# Patient Record
Sex: Female | Born: 1955 | Race: White | Hispanic: Refuse to answer
Health system: Western US, Academic
[De-identification: ages and names within clinical notes are randomized; demographics above are authoritative.]

## PROBLEM LIST (undated history)

## (undated) DIAGNOSIS — D649 Anemia, unspecified: Secondary | ICD-10-CM

## (undated) DIAGNOSIS — H269 Unspecified cataract: Secondary | ICD-10-CM

## (undated) DIAGNOSIS — I10 Essential (primary) hypertension: Secondary | ICD-10-CM

## (undated) DIAGNOSIS — Z8711 Personal history of peptic ulcer disease: Secondary | ICD-10-CM

## (undated) DIAGNOSIS — M1990 Unspecified osteoarthritis, unspecified site: Secondary | ICD-10-CM

## (undated) DIAGNOSIS — F410 Panic disorder [episodic paroxysmal anxiety] without agoraphobia: Secondary | ICD-10-CM

## (undated) DIAGNOSIS — Z9889 Other specified postprocedural states: Secondary | ICD-10-CM

## (undated) DIAGNOSIS — F32A Depression, unspecified: Secondary | ICD-10-CM

## (undated) DIAGNOSIS — T7840XA Allergy, unspecified, initial encounter: Secondary | ICD-10-CM

## (undated) DIAGNOSIS — F419 Anxiety disorder, unspecified: Secondary | ICD-10-CM

## (undated) DIAGNOSIS — R112 Nausea with vomiting, unspecified: Secondary | ICD-10-CM

## (undated) DIAGNOSIS — N189 Chronic kidney disease, unspecified: Secondary | ICD-10-CM

## (undated) DIAGNOSIS — M797 Fibromyalgia: Secondary | ICD-10-CM

## (undated) DIAGNOSIS — G43909 Migraine, unspecified, not intractable, without status migrainosus: Secondary | ICD-10-CM

## (undated) DIAGNOSIS — F4310 Post-traumatic stress disorder, unspecified: Secondary | ICD-10-CM

## (undated) DIAGNOSIS — E039 Hypothyroidism, unspecified: Secondary | ICD-10-CM

## (undated) DIAGNOSIS — I209 Angina pectoris, unspecified: Secondary | ICD-10-CM

## (undated) DIAGNOSIS — K759 Inflammatory liver disease, unspecified: Secondary | ICD-10-CM

## (undated) DIAGNOSIS — J45909 Unspecified asthma, uncomplicated: Secondary | ICD-10-CM

## (undated) DIAGNOSIS — Z8719 Personal history of other diseases of the digestive system: Secondary | ICD-10-CM

## (undated) DIAGNOSIS — R0602 Shortness of breath: Secondary | ICD-10-CM

## (undated) HISTORY — DX: Unspecified asthma, uncomplicated: J45.909

## (undated) HISTORY — DX: Depression, unspecified: F32.A

## (undated) HISTORY — PX: CHOLECYSTECTOMY: SHX55

## (undated) HISTORY — PX: EYE SURGERY: SHX253

## (undated) HISTORY — DX: Allergy, unspecified, initial encounter: T78.40XA

## (undated) HISTORY — DX: Unspecified cataract: H26.9

---
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
## 1976-03-05 HISTORY — PX: THYROIDECTOMY: SHX17

## 1987-03-06 HISTORY — PX: VAGINAL HYSTERECTOMY: SUR661

## 1993-03-05 DIAGNOSIS — F4310 Post-traumatic stress disorder, unspecified: Secondary | ICD-10-CM

## 1993-03-05 HISTORY — DX: Post-traumatic stress disorder, unspecified: F43.10

## 2000-06-28 ENCOUNTER — Encounter: Admission: RE | Admit: 2000-06-28 | Discharge: 2000-06-28 | Payer: Self-pay | Admitting: Family Medicine

## 2000-06-28 ENCOUNTER — Encounter: Payer: Self-pay | Admitting: Family Medicine

## 2000-08-12 ENCOUNTER — Encounter: Admission: RE | Admit: 2000-08-12 | Discharge: 2000-08-12 | Payer: Self-pay | Admitting: Family Medicine

## 2000-08-12 ENCOUNTER — Encounter: Payer: Self-pay | Admitting: Family Medicine

## 2001-08-20 ENCOUNTER — Encounter: Payer: Self-pay | Admitting: Family Medicine

## 2001-08-20 ENCOUNTER — Encounter: Admission: RE | Admit: 2001-08-20 | Discharge: 2001-08-20 | Payer: Self-pay | Admitting: Family Medicine

## 2002-02-09 ENCOUNTER — Encounter: Payer: Self-pay | Admitting: Family Medicine

## 2002-02-09 ENCOUNTER — Ambulatory Visit (HOSPITAL_COMMUNITY): Admission: RE | Admit: 2002-02-09 | Discharge: 2002-02-09 | Payer: Self-pay | Admitting: Family Medicine

## 2002-02-17 ENCOUNTER — Encounter: Payer: Self-pay | Admitting: Gastroenterology

## 2002-02-17 ENCOUNTER — Ambulatory Visit (HOSPITAL_COMMUNITY): Admission: RE | Admit: 2002-02-17 | Discharge: 2002-02-17 | Payer: Self-pay | Admitting: Gastroenterology

## 2005-06-21 ENCOUNTER — Encounter: Admission: RE | Admit: 2005-06-21 | Discharge: 2005-06-21 | Payer: Self-pay | Admitting: Family Medicine

## 2006-08-14 ENCOUNTER — Encounter: Admission: RE | Admit: 2006-08-14 | Discharge: 2006-08-14 | Payer: Self-pay | Admitting: Family Medicine

## 2006-11-08 ENCOUNTER — Ambulatory Visit (HOSPITAL_COMMUNITY): Admission: RE | Admit: 2006-11-08 | Discharge: 2006-11-08 | Payer: Self-pay | Admitting: Gastroenterology

## 2006-11-08 ENCOUNTER — Encounter (INDEPENDENT_AMBULATORY_CARE_PROVIDER_SITE_OTHER): Payer: Self-pay | Admitting: Gastroenterology

## 2007-09-17 ENCOUNTER — Encounter: Admission: RE | Admit: 2007-09-17 | Discharge: 2007-09-17 | Payer: Self-pay | Admitting: Family Medicine

## 2008-09-29 ENCOUNTER — Encounter: Admission: RE | Admit: 2008-09-29 | Discharge: 2008-09-29 | Payer: Self-pay | Admitting: Family Medicine

## 2009-06-15 ENCOUNTER — Encounter: Admission: RE | Admit: 2009-06-15 | Discharge: 2009-06-15 | Payer: Self-pay | Admitting: Family Medicine

## 2009-10-03 HISTORY — PX: KNEE CARTILAGE SURGERY: SHX688

## 2010-03-05 DIAGNOSIS — I10 Essential (primary) hypertension: Secondary | ICD-10-CM

## 2010-03-05 HISTORY — DX: Essential (primary) hypertension: I10

## 2010-07-18 NOTE — Op Note (Signed)
[name], [name]               ACCOUNT NO.:  [id number]   MEDICAL RECORD NO.:  [id number]          PATIENT TYPE:  AMB   LOCATION:  ENDO                         FACILITY:  [facility]   PHYSICIAN:  Anselmo Rod, M.D.  DATE OF BIRTH:  Jul 17, 1955   DATE OF PROCEDURE:  11/08/2006  DATE OF DISCHARGE:                               OPERATIVE REPORT   PROCEDURE PERFORMED:  Colonoscopy with cold biopsies x 1.   ENDOSCOPIST:  Anselmo Rod, MD.   INSTRUMENT USED:  Pentax video colonoscope.   INDICATIONS FOR PROCEDURE:  A 55 year old white female with a family  history of colon cancer in a maternal uncle and paternal grandfather  undergoing a screening colonoscopy. The patient has a history of  constipation with occasional rectal bleeding. Rule out colonic polyps,  masses, etc.   PREPROCEDURE PREPARATION:  Informed consent was procured from the  patient.  The patient fasted for 8 hours prior to the procedure. After  being prepped with a bottle of magnesium citrate and a gallon of  NuLYTELY the night prior to the procedure, the risks and benefits of the  procedure including a 10% miss rate of cancer and polyps were discussed  with the patient as well.   PREPROCEDURE PHYSICAL:  The patient had stable vital signs. Neck supple.  Chest clear to auscultation. S1, S2 regular. Abdomen soft with normal  bowel sounds.   DESCRIPTION OF PROCEDURE:  The patient was placed in the left lateral  knee position and sedated with 100 mcg of Fentanyl and 10 mg of Versed  given intravenously in slow incremental doses. Once the patient was  adequately sedated and maintained on low-flow oxygen and continuous  cardiac monitoring, the Pentax video colonoscope was advanced from the  rectum to the cecum, multiple washes were done. A small sessile polyp  was biopsied from the rectum.  The rest of the colonic mucosa appeared  healthy.  There was no evidence of diverticulosis.  The appendiceal  orifice and  ileocecal valve were clearly visualized and photographed.  The terminal ileum appeared healthy and without lesions. Retroflexion in  the rectum revealed small internal hemorrhoid.  The patient tolerated  the procedure well without complications.   IMPRESSION:  1. Small internal hemorrhoids.  2. Small sessile polyp biopsied from the rectum.  3. Otherwise normal-appearing colon up to the terminal ileum.   RECOMMENDATIONS:  1. Await pathology results.  2. Repeat colonoscopy depending on pathology results.  3. Avoid all nonsteroidals including aspirin for the next 2 weeks.  4. Outpatient follow-up as need arises in the future. A high fiber      diet with liberal fluid intake has been emphasized.      Anselmo Rod, M.D.  Electronically Signed     JNM/MEDQ  D:  11/08/2006  T:  11/08/2006  Job:  [id number]   cc:   Ace Gins, MD

## 2010-10-18 ENCOUNTER — Other Ambulatory Visit: Payer: Self-pay | Admitting: Family Medicine

## 2010-10-18 DIAGNOSIS — R51 Headache: Secondary | ICD-10-CM

## 2010-10-19 ENCOUNTER — Ambulatory Visit
Admission: RE | Admit: 2010-10-19 | Discharge: 2010-10-19 | Disposition: A | Discharge status: Home / Self Care | Payer: BC Managed Care – PPO | Source: Ambulatory Visit | Attending: Family Medicine | Admitting: Family Medicine

## 2010-10-19 DIAGNOSIS — R51 Headache: Secondary | ICD-10-CM

## 2010-10-20 ENCOUNTER — Other Ambulatory Visit: Payer: Self-pay | Admitting: Family Medicine

## 2010-10-20 DIAGNOSIS — G8929 Other chronic pain: Secondary | ICD-10-CM

## 2010-10-27 ENCOUNTER — Ambulatory Visit
Admission: RE | Admit: 2010-10-27 | Discharge: 2010-10-27 | Disposition: A | Discharge status: Home / Self Care | Payer: BC Managed Care – PPO | Source: Ambulatory Visit | Attending: Family Medicine | Admitting: Family Medicine

## 2010-10-27 DIAGNOSIS — G8929 Other chronic pain: Secondary | ICD-10-CM

## 2010-10-27 DIAGNOSIS — R102 Pelvic and perineal pain: Secondary | ICD-10-CM

## 2011-04-02 ENCOUNTER — Other Ambulatory Visit: Payer: Self-pay | Admitting: Family Medicine

## 2011-04-02 DIAGNOSIS — Z1231 Encounter for screening mammogram for malignant neoplasm of breast: Secondary | ICD-10-CM

## 2011-04-09 ENCOUNTER — Ambulatory Visit
Admission: RE | Admit: 2011-04-09 | Discharge: 2011-04-09 | Disposition: A | Discharge status: Home / Self Care | Payer: BC Managed Care – PPO | Source: Ambulatory Visit | Attending: Family Medicine | Admitting: Family Medicine

## 2011-04-09 DIAGNOSIS — Z1231 Encounter for screening mammogram for malignant neoplasm of breast: Secondary | ICD-10-CM

## 2011-04-17 ENCOUNTER — Ambulatory Visit: Payer: BC Managed Care – PPO

## 2011-08-23 ENCOUNTER — Other Ambulatory Visit: Payer: Self-pay | Admitting: Orthopedic Surgery

## 2011-08-23 DIAGNOSIS — M25511 Pain in right shoulder: Secondary | ICD-10-CM

## 2011-09-03 ENCOUNTER — Ambulatory Visit
Admission: RE | Admit: 2011-09-03 | Discharge: 2011-09-03 | Disposition: A | Discharge status: Home / Self Care | Payer: BC Managed Care – PPO | Source: Ambulatory Visit | Attending: Orthopedic Surgery | Admitting: Orthopedic Surgery

## 2011-09-03 DIAGNOSIS — M25511 Pain in right shoulder: Secondary | ICD-10-CM

## 2011-11-02 ENCOUNTER — Observation Stay (HOSPITAL_COMMUNITY)
Admission: EM | Admit: 2011-11-02 | Discharge: 2011-11-04 | Disposition: A | Discharge status: Home / Self Care | Payer: BC Managed Care – PPO | Attending: Internal Medicine | Admitting: Internal Medicine

## 2011-11-02 ENCOUNTER — Observation Stay (HOSPITAL_COMMUNITY): Payer: BC Managed Care – PPO

## 2011-11-02 ENCOUNTER — Encounter (HOSPITAL_COMMUNITY): Payer: Self-pay

## 2011-11-02 ENCOUNTER — Emergency Department (HOSPITAL_COMMUNITY): Payer: BC Managed Care – PPO

## 2011-11-02 DIAGNOSIS — E079 Disorder of thyroid, unspecified: Secondary | ICD-10-CM | POA: Diagnosis present

## 2011-11-02 DIAGNOSIS — R002 Palpitations: Secondary | ICD-10-CM

## 2011-11-02 DIAGNOSIS — G43909 Migraine, unspecified, not intractable, without status migrainosus: Secondary | ICD-10-CM

## 2011-11-02 DIAGNOSIS — R0602 Shortness of breath: Secondary | ICD-10-CM

## 2011-11-02 DIAGNOSIS — F410 Panic disorder [episodic paroxysmal anxiety] without agoraphobia: Secondary | ICD-10-CM

## 2011-11-02 DIAGNOSIS — I10 Essential (primary) hypertension: Secondary | ICD-10-CM | POA: Diagnosis present

## 2011-11-02 DIAGNOSIS — E878 Other disorders of electrolyte and fluid balance, not elsewhere classified: Secondary | ICD-10-CM

## 2011-11-02 DIAGNOSIS — R079 Chest pain, unspecified: Principal | ICD-10-CM

## 2011-11-02 DIAGNOSIS — IMO0001 Reserved for inherently not codable concepts without codable children: Secondary | ICD-10-CM | POA: Insufficient documentation

## 2011-11-02 DIAGNOSIS — I209 Angina pectoris, unspecified: Secondary | ICD-10-CM

## 2011-11-02 DIAGNOSIS — E860 Dehydration: Secondary | ICD-10-CM

## 2011-11-02 DIAGNOSIS — F419 Anxiety disorder, unspecified: Secondary | ICD-10-CM

## 2011-11-02 DIAGNOSIS — R9431 Abnormal electrocardiogram [ECG] [EKG]: Secondary | ICD-10-CM

## 2011-11-02 DIAGNOSIS — D649 Anemia, unspecified: Secondary | ICD-10-CM

## 2011-11-02 DIAGNOSIS — E876 Hypokalemia: Secondary | ICD-10-CM

## 2011-11-02 DIAGNOSIS — E870 Hyperosmolality and hypernatremia: Secondary | ICD-10-CM

## 2011-11-02 DIAGNOSIS — E890 Postprocedural hypothyroidism: Secondary | ICD-10-CM | POA: Insufficient documentation

## 2011-11-02 DIAGNOSIS — M797 Fibromyalgia: Secondary | ICD-10-CM | POA: Diagnosis present

## 2011-11-02 HISTORY — DX: Anxiety disorder, unspecified: F41.9

## 2011-11-02 HISTORY — DX: Anemia, unspecified: D64.9

## 2011-11-02 HISTORY — DX: Inflammatory liver disease, unspecified: K75.9

## 2011-11-02 HISTORY — DX: Angina pectoris, unspecified: I20.9

## 2011-11-02 HISTORY — DX: Personal history of peptic ulcer disease: Z87.11

## 2011-11-02 HISTORY — DX: Migraine, unspecified, not intractable, without status migrainosus: G43.909

## 2011-11-02 HISTORY — DX: Post-traumatic stress disorder, unspecified: F43.10

## 2011-11-02 HISTORY — DX: Essential (primary) hypertension: I10

## 2011-11-02 HISTORY — DX: Panic disorder (episodic paroxysmal anxiety): F41.0

## 2011-11-02 HISTORY — DX: Personal history of other diseases of the digestive system: Z87.19

## 2011-11-02 HISTORY — DX: Fibromyalgia: M79.7

## 2011-11-02 HISTORY — DX: Shortness of breath: R06.02

## 2011-11-02 HISTORY — DX: Hypothyroidism, unspecified: E03.9

## 2011-11-02 LAB — COMPREHENSIVE METABOLIC PANEL
Alkaline Phosphatase: 71 U/L (ref 39–117)
BUN: 17 mg/dL (ref 6–23)
Calcium: 9.6 mg/dL (ref 8.4–10.5)
GFR calc Af Amer: 66 mL/min — ABNORMAL LOW (ref >90)
GFR calc non Af Amer: 57 mL/min — ABNORMAL LOW (ref >90)
Sodium: 139 mEq/L (ref 135–145)

## 2011-11-02 LAB — URINALYSIS, ROUTINE W REFLEX MICROSCOPIC
Bilirubin Urine: NEGATIVE
Glucose, UA: NEGATIVE mg/dL
Hgb urine dipstick: NEGATIVE
Protein, ur: NEGATIVE mg/dL
Urobilinogen, UA: 0.2 mg/dL (ref 0.0–1.0)

## 2011-11-02 LAB — CBC
HCT: 37.7 % (ref 36.0–46.0)
MCH: 29.2 pg (ref 26.0–34.0)
MCV: 83.4 fL (ref 78.0–100.0)
Platelets: 268 10*3/uL (ref 150–400)
RDW: 13.1 % (ref 11.5–15.5)

## 2011-11-02 LAB — APTT: aPTT: 27 seconds (ref 24–37)

## 2011-11-02 MED ORDER — SODIUM CHLORIDE 0.9 % IV SOLN
INTRAVENOUS | Status: DC
  Administered 2011-11-02: 22:00:00 via INTRAVENOUS

## 2011-11-02 MED ORDER — ASPIRIN 81 MG PO CHEW
324.0000 mg | CHEWABLE_TABLET | Freq: Once | ORAL | Status: DC
  Filled 2011-11-02: qty 1

## 2011-11-02 MED ORDER — REGADENOSON 0.4 MG/5ML IV SOLN
0.4000 mg | Freq: Once | INTRAVENOUS | Status: DC
  Filled 2011-11-02: qty 5

## 2011-11-02 MED ORDER — MORPHINE SULFATE 2 MG/ML IJ SOLN: 2.0000 mg | Freq: Once | INTRAMUSCULAR | Status: DC

## 2011-11-02 MED ORDER — ONDANSETRON HCL 4 MG/2ML IJ SOLN: 4.0000 mg | Freq: Four times a day (QID) | INTRAMUSCULAR | Status: DC | PRN

## 2011-11-02 MED ORDER — NITROGLYCERIN 0.4 MG SL SUBL: 0.4000 mg | SUBLINGUAL_TABLET | SUBLINGUAL | Status: DC | PRN

## 2011-11-02 MED ORDER — METOPROLOL TARTRATE 25 MG PO TABS: 25.0000 mg | ORAL_TABLET | Freq: Two times a day (BID) | ORAL | Status: DC

## 2011-11-02 MED ORDER — POTASSIUM CHLORIDE 20 MEQ/15ML (10%) PO LIQD
40.0000 meq | Freq: Once | ORAL | Status: AC
  Administered 2011-11-02: 40 meq via ORAL
  Filled 2011-11-02: qty 30

## 2011-11-02 MED ORDER — SODIUM CHLORIDE 0.9 % IV BOLUS (SEPSIS)
1000.0000 mL | Freq: Once | INTRAVENOUS | Status: AC
  Administered 2011-11-02: 1000 mL via INTRAVENOUS

## 2011-11-02 MED ORDER — ZOLPIDEM TARTRATE 5 MG PO TABS: 5.0000 mg | ORAL_TABLET | Freq: Every evening | ORAL | Status: DC | PRN

## 2011-11-02 MED ORDER — ASPIRIN EC 81 MG PO TBEC
81.0000 mg | DELAYED_RELEASE_TABLET | Freq: Every day | ORAL | Status: DC
  Administered 2011-11-03 – 2011-11-04 (×2): 81 mg via ORAL
  Filled 2011-11-02 (×2): qty 1

## 2011-11-02 MED ORDER — ACETAMINOPHEN 325 MG PO TABS
650.0000 mg | ORAL_TABLET | ORAL | Status: DC | PRN
  Administered 2011-11-03 (×2): 650 mg via ORAL
  Filled 2011-11-02 (×2): qty 2

## 2011-11-02 MED ORDER — METOPROLOL TARTRATE 25 MG PO TABS
25.0000 mg | ORAL_TABLET | Freq: Two times a day (BID) | ORAL | Status: DC
  Administered 2011-11-03 – 2011-11-04 (×2): 25 mg via ORAL
  Filled 2011-11-02 (×4): qty 1

## 2011-11-02 MED ORDER — ATORVASTATIN CALCIUM 20 MG PO TABS
20.0000 mg | ORAL_TABLET | Freq: Every day | ORAL | Status: DC
  Administered 2011-11-03: 20 mg via ORAL
  Filled 2011-11-02 (×2): qty 1

## 2011-11-02 MED ORDER — PANTOPRAZOLE SODIUM 40 MG IV SOLR
40.0000 mg | Freq: Two times a day (BID) | INTRAVENOUS | Status: DC
  Administered 2011-11-02 – 2011-11-03 (×2): 40 mg via INTRAVENOUS
  Filled 2011-11-02 (×4): qty 40

## 2011-11-02 MED ORDER — METOPROLOL TARTRATE 25 MG PO TABS
25.0000 mg | ORAL_TABLET | Freq: Once | ORAL | Status: AC
  Administered 2011-11-02: 25 mg via ORAL
  Filled 2011-11-02: qty 1

## 2011-11-02 NOTE — ED Notes (Signed)
Cardiology remains at bedside. Pt ready for transport to admit room.

## 2011-11-02 NOTE — ED Notes (Signed)
Pt states feeling "funny" and c/o burning in chest. Hr 155. EKG complete given to [facility] and Dr. Radford Pax informed. Pt denies SOB. Pt color pink skin w/d resp easy non labored.

## 2011-11-02 NOTE — ED Notes (Signed)
Pt refused CT chest. Pt to floor via monitor husband informed of transport 3W informed

## 2011-11-02 NOTE — ED Provider Notes (Signed)
History     CSN: [id number]  Arrival date & time 11/02/11  1446   First MD Initiated Contact with Patient 11/02/11 1501      Chief Complaint  Patient presents with  . Chest Pain    (Consider location/radiation/quality/duration/timing/severity/associated sxs/prior treatment) HPI  56 y.o. Julie [name] iNAD c/o palpitations (feeling of heart racing and irregular) with acute onset at 1300 today lasting for 2 hours. Denies chest pain/pressure, diaphoresis, nausea or vomiting. Patient has had similar episodes in the past; usually brought on by stress.  RF: HTN, father had heart attack at 8 (still living) No cardiologist   Past Medical History  Diagnosis Date  . Hypertension   . Fibromyalgia   . Thyroid disease     Past Surgical History  Procedure Date  . Abdominal hysterectomy   . Thyroidectomy   . Joint replacement     No family history on file.  History  Substance Use Topics  . Smoking status: Not on file  . Smokeless tobacco: Not on file  . Alcohol Use:     OB History    Grav Para Term Preterm Abortions TAB SAB Ect Mult Living                  Review of Systems  Constitutional: Negative for fever.  Cardiovascular: Positive for palpitations. Negative for chest pain.  All other systems reviewed and are negative.    Allergies  Penicillins  Home Medications   Current Outpatient Rx  Name Route Sig Dispense Refill  . LEVOTHYROXINE SODIUM 100 MCG PO TABS Oral Take 100 mcg by mouth daily.    . MELOXICAM 15 MG PO TABS Oral Take 7.5 mg by mouth daily as needed. For shoulder pain    . OLMESARTAN MEDOXOMIL-HCTZ 40-25 MG PO TABS Oral Take 0.5 tablets by mouth daily.      BP 163/76  Pulse 115  Temp 98 F (36.7 C) (Oral)  Resp 18  SpO2 99%  Physical Exam  Nursing note and vitals reviewed. Constitutional: She is oriented to person, place, and time. She appears well-developed and well-nourished. No distress.  HENT:  Head: Normocephalic and atraumatic.  Eyes:  Conjunctivae and EOM are normal. Pupils are equal, round, and reactive to light.  Neck: Normal range of motion.  Cardiovascular: Intact distal pulses.        Tachycardic no murmurs rubs or gallops appreciated  Pulmonary/Chest: Effort normal and breath sounds normal. No respiratory distress. She has no wheezes. She has no rales. She exhibits no tenderness.  Musculoskeletal: Normal range of motion.  Neurological: She is alert and oriented to person, place, and time.  Skin: Skin is warm.  Psychiatric: She has a normal mood and affect.    ED Course  Procedures (including critical care time)  Labs Reviewed  COMPREHENSIVE METABOLIC PANEL - Abnormal; Notable for the following:    Potassium 3.2 (*)     Glucose, Bld 147 (*)     Total Bilirubin 0.2 (*)     GFR calc non Af Amer 57 (*)     GFR calc Af Amer 66 (*)     All other components within normal limits  APTT  PROTIME-INR  MAGNESIUM  URINALYSIS, ROUTINE W REFLEX MICROSCOPIC  CBC  POCT I-STAT TROPONIN I   Dg Chest Portable 1 View  11/02/2011  *RADIOLOGY REPORT*  Clinical Data: Chest pain.  Tachycardia.  PORTABLE CHEST - 1 VIEW  Comparison: 06/15/2009  Findings: Cardiac and mediastinal contours appear normal.  The lungs  appear clear.  No pleural effusion is identified.  IMPRESSION:  No significant abnormality identified.   Original Report Authenticated By: Dellia Cloud, M.D.     Date: 11/02/2011  Rate: 109  Rhythm: sinus tachycardia  QRS Axis: normal  Intervals: normal  ST/T Wave abnormalities: nonspecific ST changes  Conduction Disutrbances:nonspecific intraventricular conduction delay  Narrative Interpretation: ST depression in the inferior leads with no reciprocal changes  Old EKG Reviewed: none available   1. Palpitation   2. Dehydration   3. EKG abnormalities   4. Hypokalemia       MDM  56 year old Julie [name] complaining of palpitations for 2 hours now resolved. Patient's EKG shows sinus tachycardia with ST  depression in the inferior leads, no reciprocal changes and borderline IVCD. Troponin negative. No old EKGs available we also called [facility] and [facility] in [place]  order to find an old EKG, and none were available.  Found to be mildly hypokalemic at 3.2 I will supplement with 40 mEq by mouth.  Patient is found to be dehydrated with orthostatic vital signs. I will bolus 1 L normal saline.  Considering the ST depression in the patient's age she needs to be admitted for admitted for ACS rule out. Patient is not eligible for CDU low risk ACS rule out considering her EKG changes.  She will be admitted to telemetry under the care of Dr. Malachi Bonds [facility]        [facility], PA-C 11/02/11 1740

## 2011-11-02 NOTE — H&P (Signed)
[facility] History and Physical  Julie [name] ZOX:[id number] DOB: 1955/09/09 DOA: 11/02/2011  Referring physician: Dr. Lynetta Mare PCP: Sheila Oats, MD   Chief Complaint: Chest pain and palpitations  HPI:   56 yo female with history of hypothyroidism on synthroid, hypertension, fibromyalgia who was feeling completely normal this morning.  At 1pm, she felt her heart beating extremely fast.  She had palpitations without chest pain.  She did not feel Julie [name] of breath without nausea, vomiting, diaphoresis, presyncopal.  She went to PCP where she had an EKG and was given 4 baby aspirins.  She was transported via EMS to the [facility] ER.  She developed a burning sensation in the left chest 8/10 in the ambulance and in the ER at which time she started feeling anxious.  Chest pain improved in the ER and then suddenly she felt flushed and then the burning returned.  She denies shortness of breath, nausea, diaphoresis.  States pain in currently 3/10 with some radiation to the mid-back.  Patient's father had his first MI at 6 years old.  In ER, labs notable for hypokalemia, replaced.  Troponin #1 negative.  First EKG showed 0.5 to 1mm ST segment depressions in inferior leads and V5-V6.  Sinus tachycardia.  She was given bolus of NS.  When chest pain returned, EKG repeated and again sinus tachycardia with similar morphology, although slightly less ST segment depression in inferior leads and T-waves appear taller.  Review of Systems:   Left shoulder pain, recently received cortisone one week ago and started maloxicam last night, first dose.  Complete ROS otherwise negative.  Past Medical History  Diagnosis Date  . Hypertension   . Fibromyalgia   . Thyroid disease    Past Surgical History  Procedure Date  . Abdominal hysterectomy   . Thyroidectomy   . Meninscal repair    Social History:  reports that she has never smoked. She does not have any smokeless tobacco history on file. She  reports that she drinks alcohol. She reports that she does not use illicit drugs. Lives at home with husband and able to ambulate without assist device.  Completes ADLS on own.    Allergies  Allergen Reactions  . Penicillins Hives    Family History  Problem Relation Age of Onset  . Coronary artery disease Father     First at age 23  . Diabetes Father   . Asthma Sister   . Thyroid disease Daughter   . Thyroid disease Daughter   . Hypertension Maternal Grandfather   . Aneurysm Paternal Grandmother     d/o brain aneurysm in 30s    Prior to Admission medications   Medication Sig Start Date End Date Taking? Authorizing Provider  levothyroxine (SYNTHROID, LEVOTHROID) 100 MCG tablet Take 100 mcg by mouth daily.   Yes Historical Provider, MD  meloxicam (MOBIC) 15 MG tablet Take 7.5 mg by mouth daily as needed. For shoulder pain   Yes Historical Provider, MD  olmesartan-hydrochlorothiazide (BENICAR HCT) 40-25 MG per tablet Take 0.5 tablets by mouth daily.   Yes Historical Provider, MD   Physical Exam: Filed Vitals:   11/02/11 1706 11/02/11 1709 11/02/11 1710 11/02/11 1745  BP: 124/69 135/75 139/72 180/93  Pulse: 78 90 100 136  Temp:      TempSrc:      Resp:    18  SpO2:    99%     General:  Thin CF, no acute distress  Eyes: PERRL, anicteric, noninjected  ENT: nares and  OP without exudate  Neck: supple, no thyromegaly or nodules, JVP 3cm from clavicle and bounding  Cardiovascular: Tachycardic, regular rhythm, bounding nondisplaced PMI, 2/6 systolic murmur RUSB, and across precordium  Respiratory: CTAB  Abdomen:  NABS, soft, nondistended, nontender  Skin: no rash  Musculoskeletal: Normal tone and bulk  Psychiatric: A&Ox4  Neurologic: II-XII intact, 5/5 throughout, sensation intact  Labs on Admission:  Basic Metabolic Panel:  Lab 11/02/11 1610  NA 139  K 3.2*  CL 100  CO2 26  GLUCOSE 147*  BUN 17  CREATININE 1.06  CALCIUM 9.6  MG 2.0  PHOS --   Liver  Function Tests:  Lab 11/02/11 1500  AST 15  ALT 12  ALKPHOS 71  BILITOT 0.2*  PROT 7.1  ALBUMIN 4.0   No results found for this basename: LIPASE:5,AMYLASE:5 in the last 168 hours No results found for this basename: AMMONIA:5 in the last 168 hours CBC:  Lab 11/02/11 1500  WBC 8.6  NEUTROABS --  HGB 13.2  HCT 37.7  MCV 83.4  PLT 268   Cardiac Enzymes: No results found for this basename: CKTOTAL:5,CKMB:5,CKMBINDEX:5,TROPONINI:5 in the last 168 hours  BNP (last 3 results) No results found for this basename: PROBNP:3 in the last 8760 hours CBG: No results found for this basename: GLUCAP:5 in the last 168 hours  Radiological Exams on Admission: Dg Chest Portable 1 View  11/02/2011  *RADIOLOGY REPORT*  Clinical Data: Chest pain.  Tachycardia.  PORTABLE CHEST - 1 VIEW  Comparison: 06/15/2009  Findings: Cardiac and mediastinal contours appear normal.  The lungs appear clear.  No pleural effusion is identified.  IMPRESSION:  No significant abnormality identified.   Original Report Authenticated By: Dellia Cloud, M.D.     EKG:  See HPI portion  Assessment/Plan Principal Problem:  *Chest pain Active Problems:  Hypertension  Fibromyalgia  Thyroid disease  Palpitations  Chest pain:  DDX includes ACS (unstable angina), dissection, myocarditis, PE, severe GERD, anxiety, hyperthyroidism, or less likely, pheo or carcinoid given flushed sensation.  Most important to rule out common and severe problems. -  CTangio to rule out dissection -  Cardiology to evaluate patient given subtle EKG changes and family history -  Telemetry and cycle troponins -  ECHO -  Start statin and consider beta blocker (if no stress test in AM) -  Continue ASA -  Consider lovenox if CTa negative -  AM lipid panel and A1c -  Morphine once -  NTG prn -  Continue oxygen via nasal canula Also: -  Add on TSH -  Start IV protonix -  Consider serum metanephrines and/or carcinoid w/u if other  evaluation not revealing  Hyperthyroidism:  Possibly on excessive dose.   -  Hold synthroid pending TSH  Fibromyalgia:  Stable.  Hypertension:  Blood pressure currently labile, possibly secondary to pain -  Hold home BP meds as patient was dry at presentation -  Control pain -  Consider addition of labetalol or clonidine prn  DIET:  Healthy heart, then NPO at MN if stress in A  Code Status: Full  Family Communication: Airis Barbee.  [phone] Disposition Plan: Pending completion of work up to rule out lethal causes of chest pain.  Time spent: 79  SHORTThea [name] [facility] Pager [phone]  If 7PM-7AM, please contact night-coverage [email or web address] Password [email or web address] 11/02/2011, 6:06 PM

## 2011-11-02 NOTE — ED Notes (Signed)
Pt denies complaints at this time. Pt ambulates to bathroom

## 2011-11-02 NOTE — Consult Note (Signed)
CARDIOLOGY CONSULT NOTE  Patient ID: Julie [name] MRN: [id number], DOB/AGE: 58-Jul-1957   Admit date: 11/02/2011 Date of Consult: 11/02/2011   Primary Physician: [facility] Primary Cardiologist: new - would f/u in [place].  Pt. Profile  56 year old female without prior cardiac history who presented to ED with tachycardia and chest burning.  Problem List  Past Medical History  Diagnosis Date  . Hypertension   . Fibromyalgia   . Thyroid disease     Past Surgical History  Procedure Date  . Abdominal hysterectomy   . Thyroidectomy   . Meninscal repair     Allergies  Allergies  Allergen Reactions  . Penicillins Hives   HPI   57 year old female with the above problem list. She has no prior cardiac history although her father has had multiple myocardial infarctions the first occurring at age 51.  She reports that over the past few weeks she's been feeling more anxious than usual. She does have a history of hypothyroidism with frequent swings and her thyroid levels and subsequent dose changes. She says that usually when she is running hyperthyroid she feels this way. Today, she was walking at work and had sudden onset of tachy-palpitations with a sense of general malaise. She drove directly to her physician's office where an ECG was performed and she was told something did not look right. She was given 4 baby aspirin and sent via EMS to the ED. Here, ECG shows a sinus tachycardia with minimal inferolateral ST segment depression and flattening. Cardiac markers have been normal. She was initially symptom-free however while sitting in the ER, she had recurrent sinus tachycardia with rates into the 130s. This was again associated with chest burning. Rate and symptoms returned to normal without intervention after a few minutes. She's currently pain-free. We've asked to eval.  Inpatient Medications     . aspirin  324 mg Oral Once  .  morphine injection  2 mg Intravenous Once  . potassium  chloride  40 mEq Oral Once  . sodium chloride  1,000 mL Intravenous Once   Home Meds  Prior to Admission medications   Medication Sig Start Date End Date Taking? Authorizing Provider  levothyroxine (SYNTHROID, LEVOTHROID) 100 MCG tablet Take 100 mcg by mouth daily.   Yes Historical Provider, MD  meloxicam (MOBIC) 15 MG tablet Take 7.5 mg by mouth daily as needed. For shoulder pain   Yes Historical Provider, MD  olmesartan-hydrochlorothiazide (BENICAR HCT) 40-25 MG per tablet Take 0.5 tablets by mouth daily.   Yes Historical Provider, MD    Family History Family History  Problem Relation Age of Onset  . Coronary artery disease Father     First at age 58  . Diabetes Father   . Asthma Sister   . Thyroid disease Daughter   . Thyroid disease Daughter   . Hypertension Maternal Grandfather   . Aneurysm Paternal Grandmother     d/o brain aneurysm in 30s     Social History History   Social History  . Marital Status: Married    Spouse Name: N/A    Number of Children: N/A  . Years of Education: N/A   Occupational History  . Not on file.   Social History Main Topics  . Smoking status: Never Smoker   . Smokeless tobacco: Not on file  . Alcohol Use: 0.0 oz/week    0 Glasses of wine per week     occasional glass of wine.  . Drug Use: No  . Sexually Active:  Not on file   Other Topics Concern  . 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  She does not routinely exercise.  Lives in [place] with husband.    Review of Systems  General:  No chills, fever, night sweats or weight changes.  She has been feeling more anxious than usual.  Cardiovascular: Chest burning and palpitations as outlined above.  No dyspnea on exertion, edema, orthopnea, palpitations, paroxysmal nocturnal dyspnea. Dermatological: No rash, lesions/masses Respiratory: No cough, dyspnea Urologic: No hematuria, dysuria Abdominal:   No nausea, vomiting, diarrhea, bright red  blood per rectum, melena, or hematemesis Neurologic:  No visual changes, wkns, changes in mental status. All other systems reviewed and are otherwise negative except as noted above.  Physical Exam  Blood pressure 164/85, pulse 108, temperature 98 F (36.7 C), temperature source Oral, resp. rate 21, SpO2 100.00%.  General: Pleasant, NAD Psych: Normal affect. Neuro: Alert and oriented X 3. Moves all extremities spontaneously. HEENT: Normal  Neck: Supple without bruits or JVD. Lungs:  Resp regular and unlabored, CTA. Heart: RRR, tachy. no s3, s4, or murmurs. Abdomen: Soft, non-tender, non-distended, BS + x 4.  Extremities: No clubbing, cyanosis or edema. DP/PT/Radials 2+ and equal bilaterally.  Labs  Lab Results  Component Value Date   WBC 8.6 11/02/2011   HGB 13.2 11/02/2011   HCT 37.7 11/02/2011   MCV 83.4 11/02/2011   PLT 268 11/02/2011     Lab 11/02/11 1500  NA 139  K 3.2*  CL 100  CO2 26  BUN 17  CREATININE 1.06  CALCIUM 9.6  PROT 7.1  BILITOT 0.2*  ALKPHOS 71  ALT 12  AST 15  GLUCOSE 147*   Radiology/Studies  Dg Chest Portable 1 View  11/02/2011  *RADIOLOGY REPORT*  Clinical Data: Chest pain.  Tachycardia.  PORTABLE CHEST - 1 VIEW  Comparison: 06/15/2009  Findings: Cardiac and mediastinal contours appear normal.  The lungs appear clear.  No pleural effusion is identified.  IMPRESSION:  No significant abnormality identified.   Original Report Authenticated By: Dellia Cloud, M.D.    ECG  Sinus tach, 109, minimal st dep/flattening II, III, aVF, V5,V6.  ASSESSMENT AND PLAN   1. Sinus tachycardia: Patient's biggest complaint is palpitations with associated chest burning. She's had sinus tachycardia since presenting to the ED. She is scheduled for a CT angiography chest by the internal medicine team to rule out PE or dissection. She is currently hemodynamically stable and without complaints. Given her history of fluctuating TSH and Synthroid dosing, recommend  thyroid function testing. She says it has been a few months.  We will add low-dose beta blocker.  2.  Chest burning: This has occurred in the setting of palpitations. She does have some ST segment changes although minimal. Don't have an old ECG to compare. Given her family history coronary disease, we will arrange for a pharmacologic stress test in the a.m. to rule out ischemia.  3.  HTN:  Resume home meds.  Add bb.  Signed, Nicolasa Ducking, NP 11/02/2011, 7:24 PM  Attending Note:   The patient was seen and examined.  Agree with assessment and plan as noted above.  Takyra is a 56 yo with hx of hypothyroidism who presents with episodes of chest burning and tachycardia.   She had mild ST depression in the inferior and lateral leads on ECG.  Agree with physical exam as above. Will start her on Metoprolol 25 BID and  get a lexiscan myoview in the morning.  She has borderline elevated lipids.  I think we should be aggressive in her lipid lowering therpy given her family hx of early MI.  Vesta Mixer, Montez Hageman., MD, [facility] 11/02/2011, 7:49 PM

## 2011-11-02 NOTE — ED Notes (Signed)
Julie [name] paged to inform pt refused CT

## 2011-11-02 NOTE — ED Notes (Signed)
Here from pmd for evaluation of chest pressure, depression noted inferior only, htn.

## 2011-11-02 NOTE — ED Provider Notes (Signed)
Medical screening examination/treatment/procedure(s) were performed by non-physician practitioner and as supervising physician I was immediately available for consultation/collaboration.    Nelia Shi, MD 11/02/11 [phone]

## 2011-11-03 DIAGNOSIS — D649 Anemia, unspecified: Secondary | ICD-10-CM

## 2011-11-03 DIAGNOSIS — I369 Nonrheumatic tricuspid valve disorder, unspecified: Secondary | ICD-10-CM

## 2011-11-03 DIAGNOSIS — E878 Other disorders of electrolyte and fluid balance, not elsewhere classified: Secondary | ICD-10-CM

## 2011-11-03 DIAGNOSIS — E870 Hyperosmolality and hypernatremia: Secondary | ICD-10-CM

## 2011-11-03 LAB — BASIC METABOLIC PANEL
BUN: 14 mg/dL (ref 6–23)
Calcium: 8.9 mg/dL (ref 8.4–10.5)
Creatinine, Ser: 0.9 mg/dL (ref 0.50–1.10)
GFR calc Af Amer: 80 mL/min — ABNORMAL LOW (ref >90)
GFR calc non Af Amer: 69 mL/min — ABNORMAL LOW (ref >90)

## 2011-11-03 LAB — LIPID PANEL
HDL: 56 mg/dL (ref >39)
Total CHOL/HDL Ratio: 3.2 RATIO
VLDL: 11 mg/dL (ref 0–40)

## 2011-11-03 LAB — TSH: TSH: 1.209 u[IU]/mL (ref 0.350–4.500)

## 2011-11-03 LAB — D-DIMER, QUANTITATIVE: D-Dimer, Quant: 0.3 ug/mL-FEU (ref 0.00–0.48)

## 2011-11-03 LAB — CBC
HCT: 35.1 % — ABNORMAL LOW (ref 36.0–46.0)
MCH: 28.3 pg (ref 26.0–34.0)
MCHC: 33 g/dL (ref 30.0–36.0)
MCV: 85.6 fL (ref 78.0–100.0)
Platelets: 266 10*3/uL (ref 150–400)
RDW: 13.5 % (ref 11.5–15.5)

## 2011-11-03 LAB — TROPONIN I: Troponin I: <0.3 ng/mL (ref <0.30)

## 2011-11-03 MED ORDER — PANTOPRAZOLE SODIUM 40 MG PO TBEC
40.0000 mg | DELAYED_RELEASE_TABLET | Freq: Two times a day (BID) | ORAL | Status: DC
  Administered 2011-11-04: 40 mg via ORAL
  Filled 2011-11-03: qty 1

## 2011-11-03 MED ORDER — ALUM & MAG HYDROXIDE-SIMETH 200-200-20 MG/5ML PO SUSP
30.0000 mL | Freq: Once | ORAL | Status: AC
  Administered 2011-11-03: 30 mL via ORAL
  Filled 2011-11-03: qty 30

## 2011-11-03 MED ORDER — LEVOTHYROXINE SODIUM 100 MCG PO TABS
100.0000 ug | ORAL_TABLET | Freq: Every day | ORAL | Status: DC
  Administered 2011-11-03 – 2011-11-04 (×2): 100 ug via ORAL
  Filled 2011-11-03 (×3): qty 1

## 2011-11-03 NOTE — Progress Notes (Addendum)
[facility] PROGRESS NOTE  YAMINAH CLAYBORN ZOX:[id number] DOB: [birth year] DOA: 11/02/2011 PCP: Sheila Oats, MD  Assessment/Plan: Principal Problem:  *Chest pain Active Problems:  Hypertension  Fibromyalgia  Thyroid disease  Palpitations  Chest pain: DDX includes ACS (unstable angina), dissection, myocarditis, PE, severe GERD, anxiety, hyperthyroidism, or less likely, pheo or carcinoid given flushed sensation. Most important to rule out common and severe problems.  Patient remembered yesterday that she has contrast intolerance which causes palpitations so CTa deferred at this time.  D-dimer done as alternative and was below threshold.  Troponins negative.  No telemetry events (including tachycardia) overnight despite multiple episodes of pain.  Possibly tachycardia and hypertension both related to pain and not causative.  Dissection less likely as patient not persistently hypertensive.  Defer serum metanephrines and carcinoid work up at this time.   - f/u ECHO  -  stress test in AM - Continue statin and beta blocker - Continue ASA  - NTG prn   Also, TSH was within normal limits so okay to restart synthroid.  Appreciate colleague adding order this morning.   -  Will do PPI PO BID and add maalox as symptoms are somewhat better since yesterday after starting protonix and were worse overnight compared to this morning. -  Add on lipase  Per cardiology, given family history, should aim for lower LDL level than for similar patient with only hypertension risk factor.  LDL 110 and HDL 56 -  Continue statin.  Hypernatremia and hyperchloremia likely related to IVF versus lab error.  Anemia likely hemodilution related and very mild, particularly since WBC down also.  No evidence of bleeding or hemolysis on exam and labs respectively. -  Recheck in AM now that IVF d/ced  Hyperthyroidism: TSH at goal - Continue synthroid  Fibromyalgia: Stable.  Hypertension: Blood pressure labile  yesterday, but improved today, SBP 130s  - Continue to hold home BP meds as started beta blocker and BP at goal   DIET: Healthy heart, then NPO at MN for stress ACCESS:  PIV IVF:  D/C today PROPH:  SCDs and patient ambulating.   Code Status: Full Family Communication: Spoke with patient regarding plan Disposition Plan: Pending stress test tomorrow with outpatient follow up with cardiology for cardionet   Brief narrative:  56 yo female with history of hypothyroidism on synthroid, hypertension, fibromyalgia who was feeling completely normal this morning. At 1pm, she felt her heart beating extremely fast. She had palpitations without chest pain. She did not feel Cong Hightower of breath without nausea, vomiting, diaphoresis, presyncopal. She went to PCP where she had an EKG and was given 4 baby aspirins. She was transported via EMS to the [facility] ER. She developed a burning sensation in the left chest 8/10 in the ambulance and in the ER at which time she started feeling anxious. Chest pain improved in the ER and then suddenly she felt flushed and then the burning returned. She denies shortness of breath, nausea, diaphoresis. States pain in currently 3/10 with some radiation to the mid-back. Patient's father had his first MI at 2 years old.  Pain occurs at rest.    In ER, labs notable for hypokalemia, replaced. Troponin #1 negative. First EKG showed 0.5 to 1mm ST segment depressions in inferior leads and V5-V6. Sinus tachycardia. She was given bolus of NS. When chest pain returned, EKG repeated and again sinus tachycardia with similar morphology, although slightly less ST segment depression in inferior leads and T-waves appear taller.   Consultants:  Cardiology  Procedures:  ECHO 8/31  Stress test planned for 9/1  Antibiotics:  None  HPI/Subjective:  Patient states that pain improved after initial admission but recurred, although milder form, overnight several times and has come back  again this morning.  Denies shortness of breath, nausea, vomiting, diarrhea, constipation, diaphoresis.  Denies indigestion and heartburn.  Objective: Filed Vitals:   11/02/11 1830 11/02/11 2100 11/03/11 0500 11/03/11 0955  BP: 164/85 124/75 130/75 131/86  Pulse: 108 76 68 73  Temp:  98.6 F (37 C) 98.3 F (36.8 C)   TempSrc:      Resp: 21 20 18    Height:  5\' 4"  (1.626 m)    Weight:  71.396 kg (157 lb 6.4 oz)    SpO2: 100% 96% 94%     Intake/Output Summary (Last 24 hours) at 11/03/11 1223 Last data filed at 11/03/11 0900  Gross per 24 hour  Intake    240 ml  Output      0 ml  Net    240 ml   Filed Weights   11/02/11 2100  Weight: 71.396 kg (157 lb 6.4 oz)    Exam:  General: Thin CF, no acute distress  HEENT: PERRL, anicteric, noninjected, MMM Cardiovascular: Regular rate and rhythm, no murmurs rubs or gallops Respiratory: CTAB  Abdomen: NABS, soft, nondistended, nontender  Musculoskeletal: Normal tone and bulk  Psychiatric: A&Ox4    Data Reviewed: Basic Metabolic Panel:  Lab 11/03/11 9562 11/02/11 1500  NA 148* 139  K 4.2 3.2*  CL 115* 100  CO2 26 26  GLUCOSE 86 147*  BUN 14 17  CREATININE 0.90 1.06  CALCIUM 8.9 9.6  MG -- 2.0  PHOS -- --   Liver Function Tests:  Lab 11/02/11 1500  AST 15  ALT 12  ALKPHOS 71  BILITOT 0.2*  PROT 7.1  ALBUMIN 4.0   No results found for this basename: LIPASE:5,AMYLASE:5 in the last 168 hours No results found for this basename: AMMONIA:5 in the last 168 hours CBC:  Lab 11/03/11 0455 11/02/11 1500  WBC 5.9 8.6  NEUTROABS -- --  HGB 11.6* 13.2  HCT 35.1* 37.7  MCV 85.6 83.4  PLT 266 268   Cardiac Enzymes:  Lab 11/03/11 0455 11/02/11 2104  CKTOTAL -- --  CKMB -- --  CKMBINDEX -- --  TROPONINI <0.30 <0.30   BNP (last 3 results) No results found for this basename: PROBNP:3 in the last 8760 hours CBG: No results found for this basename: GLUCAP:5 in the last 168 hours  No results found for this or any  previous visit (from the past 240 hour(s)).   Studies: Dg Chest Portable 1 View  11/02/2011  *RADIOLOGY REPORT*  Clinical Data: Chest pain.  Tachycardia.  PORTABLE CHEST - 1 VIEW  Comparison: 06/15/2009  Findings: Cardiac and mediastinal contours appear normal.  The lungs appear clear.  No pleural effusion is identified.  IMPRESSION:  No significant abnormality identified.   Original Report Authenticated By: Dellia Cloud, M.D.     Scheduled Meds:   . aspirin  324 mg Oral Once  . aspirin EC  81 mg Oral Daily  . atorvastatin  20 mg Oral q1800  . levothyroxine  100 mcg Oral QAC breakfast  . metoprolol tartrate  25 mg Oral BID  . metoprolol tartrate  25 mg Oral Once  .  morphine injection  2 mg Intravenous Once  . pantoprazole (PROTONIX) IV  40 mg Intravenous Q12H  . potassium chloride  40  mEq Oral Once  . regadenoson  0.4 mg Intravenous Once  . sodium chloride  1,000 mL Intravenous Once  . DISCONTD: metoprolol tartrate  25 mg Oral BID  . DISCONTD: regadenoson  0.4 mg Intravenous Once   Continuous Infusions:   . DISCONTD: sodium chloride 150 mL/hr at 11/02/11 2154    Principal Problem:  *Chest pain Active Problems:  Hypertension  Fibromyalgia  Thyroid disease  Palpitations    Time spent: 30    Arik Husmann, [facility]  [facility] Pager [phone]. If 8PM-8AM, please contact night-coverage at [email or web address], password [facility] 11/03/2011, 12:23 PM  LOS: 1 day

## 2011-11-03 NOTE — Progress Notes (Signed)
Peyton Bottoms, MD, [facility] ABIM Board Certified in Adult Cardiovascular Medicine,Internal Medicine and Critical Care Medicine      Subjective:    Some burning during nighttime. No SOB. No recurrent tachycardia    Objective:   Weight Range:  Vital Signs:   Temp:  [98 F (36.7 C)-98.6 F (37 C)] 98.3 F (36.8 C) (08/31 0500) Pulse Rate:  [68-136] 73  (08/31 0955) Resp:  [16-21] 18  (08/31 0500) BP: (124-188)/(68-93) 131/86 mmHg (08/31 0955) SpO2:  [94 %-100 %] 94 % (08/31 0500) Weight:  [157 lb 6.4 oz (71.396 kg)] 157 lb 6.4 oz (71.396 kg) (08/30 2100) Last BM Date: 11/02/11  Weight change: Filed Weights   11/02/11 2100  Weight: 157 lb 6.4 oz (71.396 kg)    Intake/Output:   Intake/Output Summary (Last 24 hours) at 11/03/11 1007 Last data filed at 11/03/11 0900  Gross per 24 hour  Intake    240 ml  Output      0 ml  Net    240 ml     Physical Exam: gen- no distress Neck- nl carotid upstoke Lungs; clear BS B Cor: RRR no murmurs abd soft Ext no edema.     Telemetry: NSR Labs: Basic Metabolic Panel:  Lab 11/03/11 1610 11/02/11 1500  NA 148* 139  K 4.2 3.2*  CL 115* 100  CO2 26 26  GLUCOSE 86 147*  BUN 14 17  CREATININE 0.90 1.06  CALCIUM 8.9 9.6  MG -- 2.0  PHOS -- --    Liver Function Tests:  Lab 11/02/11 1500  AST 15  ALT 12  ALKPHOS 71  BILITOT 0.2*  PROT 7.1  ALBUMIN 4.0   No results found for this basename: LIPASE:5,AMYLASE:5 in the last 168 hours No results found for this basename: AMMONIA:3 in the last 168 hours  CBC:  Lab 11/03/11 0455 11/02/11 1500  WBC 5.9 8.6  NEUTROABS -- --  HGB 11.6* 13.2  HCT 35.1* 37.7  MCV 85.6 83.4  PLT 266 268    Cardiac Enzymes:  Lab 11/03/11 0455 11/02/11 2104  CKTOTAL -- --  CKMB -- --  CKMBINDEX -- --  TROPONINI <0.30 <0.30     BNP: BNP (last 3 results) No results found for this basename: PROBNP:3 in the last 8760 hours  ABG No results found for this basename: phart, pco2,  pco2art, po2, po2art, hco3, tco2, acidbasedef, o2sat     Other results: EKG: NSR no acute changes Imaging: Dg Chest Portable 1 View  11/02/2011  *RADIOLOGY REPORT*  Clinical Data: Chest pain.  Tachycardia.  PORTABLE CHEST - 1 VIEW  Comparison: 06/15/2009  Findings: Cardiac and mediastinal contours appear normal.  The lungs appear clear.  No pleural effusion is identified.  IMPRESSION:  No significant abnormality identified.   Original Report Authenticated By: Dellia Cloud, M.D.       Medications:     Scheduled Medications:    . aspirin  324 mg Oral Once  . aspirin EC  81 mg Oral Daily  . atorvastatin  20 mg Oral q1800  . metoprolol tartrate  25 mg Oral BID  . metoprolol tartrate  25 mg Oral Once  .  morphine injection  2 mg Intravenous Once  . pantoprazole (PROTONIX) IV  40 mg Intravenous Q12H  . potassium chloride  40 mEq Oral Once  . regadenoson  0.4 mg Intravenous Once  . sodium chloride  1,000 mL Intravenous Once  . DISCONTD: metoprolol tartrate  25 mg Oral  BID  . DISCONTD: regadenoson  0.4 mg Intravenous Once     Infusions:    . sodium chloride 150 mL/hr at 11/02/11 2154     PRN Medications:  acetaminophen, nitroGLYCERIN, ondansetron (ZOFRAN) IV, zolpidem, DISCONTD: nitroGLYCERIN   Assessment:   1. Palpitation - ST but r/o SVT  2. Dehydration   3. EKG abnormalities -nonsprecific cTnI neg.  4. Hypokalemia   5. Chest pain   6. Hypothyroidism- meds never ordered  Patient Active Problem List  Diagnosis  . Hypertension  . Fibromyalgia  . Thyroid disease  . Palpitations  . Chest pain      Plan/Discussion:    Low Wells criteria for PE- if D- dimer negative no further FHx not reliable as father smoked > 2 packs a day and morbidly obese and sister no CAD Check lipid panel AM ECHO pending today. Rescheduled Myoview for AM Continue BB - D/C tomorrow if cardiac testing negative and arrange OP cardionet.     Length of Stay: 1   Alvin Critchley  [facility] 11/03/2011, 10:07 AM

## 2011-11-03 NOTE — Progress Notes (Signed)
  Echocardiogram 2D Echocardiogram has been performed.  Georgian Co 11/03/2011, 3:45 PM

## 2011-11-03 NOTE — Progress Notes (Signed)
1500 - c/o headache tylenol given pt reports relief no further c/o

## 2011-11-04 ENCOUNTER — Observation Stay (HOSPITAL_COMMUNITY): Payer: BC Managed Care – PPO

## 2011-11-04 DIAGNOSIS — I10 Essential (primary) hypertension: Secondary | ICD-10-CM

## 2011-11-04 MED ORDER — TECHNETIUM TC 99M TETROFOSMIN IV KIT
10.0000 | PACK | Freq: Once | INTRAVENOUS | Status: AC | PRN
  Administered 2011-11-04: 10 via INTRAVENOUS

## 2011-11-04 MED ORDER — OLMESARTAN MEDOXOMIL-HCTZ 40-25 MG PO TABS: 1.0000 | ORAL_TABLET | Freq: Every day | ORAL | Status: DC

## 2011-11-04 MED ORDER — NITROGLYCERIN 0.4 MG SL SUBL: 0.4000 mg | SUBLINGUAL_TABLET | SUBLINGUAL | Status: DC | PRN

## 2011-11-04 MED ORDER — HYDROCHLOROTHIAZIDE 25 MG PO TABS
25.0000 mg | ORAL_TABLET | Freq: Every day | ORAL | Status: DC
  Filled 2011-11-04: qty 1

## 2011-11-04 MED ORDER — METOPROLOL SUCCINATE ER 25 MG PO TB24: 25.0000 mg | ORAL_TABLET | Freq: Every day | ORAL | Status: DC

## 2011-11-04 MED ORDER — METOPROLOL TARTRATE 1 MG/ML IV SOLN
INTRAVENOUS | Status: AC
  Administered 2011-11-04: 5 mg
  Filled 2011-11-04: qty 5

## 2011-11-04 MED ORDER — IRBESARTAN 300 MG PO TABS
300.0000 mg | ORAL_TABLET | Freq: Every day | ORAL | Status: DC
  Filled 2011-11-04: qty 1

## 2011-11-04 MED ORDER — ASPIRIN 81 MG PO TBEC: 81.0000 mg | DELAYED_RELEASE_TABLET | Freq: Every day | ORAL | Status: AC

## 2011-11-04 MED ORDER — ATORVASTATIN CALCIUM 20 MG PO TABS: 20.0000 mg | ORAL_TABLET | Freq: Every day | ORAL | Status: DC

## 2011-11-04 MED ORDER — TECHNETIUM TC 99M TETROFOSMIN IV KIT
30.0000 | PACK | Freq: Once | INTRAVENOUS | Status: AC | PRN
  Administered 2011-11-04: 30 via INTRAVENOUS

## 2011-11-04 NOTE — Discharge Summary (Signed)
Physician Discharge Summary  Julie [name] XBM:[id number] DOB: 09/16/1955 DOA: 11/02/2011  PCP: Sheila Oats, MD  Admit date: 11/02/2011 Discharge date: 11/04/2011  Recommendations for Outpatient Follow-up:  1. Please follow up with primary care for repeat BMP and CBC within 1 week 2. Please take prednisone, ranitidine, and benadryl prior to procedure on Friday as prescribed 3. Follow up with cardiology on Friday 9/6 for outpatient catheterization following abnormal stress test.   Discharge Diagnoses:  Principal Problem:  *Chest pain Active Problems:  Hypertension  Fibromyalgia  Thyroid disease  Palpitations  Anemia  Hypernatremia  Hyperchloremia   Discharge Condition: stable, improved  Diet recommendation: Healthy heart  Wt Readings from Last 3 Encounters:  11/04/11 71.2 kg (156 lb 15.5 oz)    History of present illness:   56 yo female with history of hypothyroidism on synthroid, hypertension, fibromyalgia who was feeling completely normal this morning. At 1pm, she felt her heart beating extremely fast. She had palpitations without chest pain. She did not feel Julie [name] of breath without nausea, vomiting, diaphoresis, presyncopal. She went to PCP where she had an EKG and was given 4 baby aspirins. She was transported via EMS to the [facility] ER. She developed a burning sensation in the left chest 8/10 in the ambulance and in the ER at which time she started feeling anxious. Chest pain improved in the ER and then suddenly she felt flushed and then the burning returned. She denies shortness of breath, nausea, diaphoresis. States pain in currently 3/10 with some radiation to the mid-back. Patient's father had his first MI at 62 years old. Pain occurs at rest.   In ER, labs notable for hypokalemia, replaced. Troponin #1 negative. First EKG showed 0.5 to 1mm ST segment depressions in inferior leads and V5-V6. Sinus tachycardia. She was given bolus of NS. When chest pain returned, EKG  repeated and again sinus tachycardia with similar morphology, although slightly less ST segment depression in inferior leads and T-waves appear taller.   Hospital Course:   Chest pain:  Patient was admitted with chest pain with sinus tachycardia and subtle EKG changes in setting of family history of early MI in patient's father.  Troponins were below treshold and telemetry showed normal sinus rhythm for almost the complete duration of patient's stay.  ECHO was normal, but stress test was abnormal (official report still pending.)  Patient was seen by cardiology and is recommended to have an outpatient catheterization on 9/6.  Other diagnoses were also considered:  D-dimer was below threshold, making dissection and PE less likely.  Liver function tests were normal (lipase only very minimally elevated at 61), but gallstone still possible.  She was given IV PPI and maalox which did not help symptoms.  TSH was wnl.   Although telemetry and blood pressure remained normal through most of her hospitalization, as the cardiologist was talking to the patient on the day of discharge, Ms Payano's HR increased to the 150s (sinus tach) and her BP escalated to almost 197/58.  Anxiety may be contributing to her symptoms, but also possible, though less likely, carcinoid and pheochromocytoma.  If further cardiac work up is unrevealing, consider further investigation into these possibilities.  The patient should start taking a baby aspirin, beta blocker, and statin in addition to her previous medications pending further investigation into etiology of pain.  She should double her home blood pressure medication.  She was given instructions on premedication for contrast administration during her catheterization on Friday.  Hypernatremia and hyperchloremia likely  related to IVF versus lab error. Anemia likely hemodilution related and very mild, particularly since WBC down also. No evidence of bleeding or hemolysis on exam and labs  respectively.  - Recheck as outpatient  Procedures:  ECHO 8/31  NM stress 9/1  Consultations:  Cardiology  Discharge Exam: Filed Vitals:   11/04/11 1003  BP: 161/83  Pulse:   Temp:   Resp:    Filed Vitals:   11/04/11 0954 11/04/11 0957 11/04/11 1001 11/04/11 1003  BP: 197/58 179/62 193/85 161/83  Pulse: 127 150 97   Temp:      TempSrc:      Resp:      Height:      Weight:      SpO2:       General: Thin CF, no acute distress  HEENT: PERRL, anicteric, noninjected, MMM  Cardiovascular: Regular rate and rhythm, no murmurs rubs or gallops  Respiratory: CTAB Abdomen: NABS, soft, nondistended, nontender  Musculoskeletal: Normal tone and bulk, no LEE Psychiatric: A&Ox4    Discharge Instructions   Medication List  As of 11/04/2011 12:15 PM   ASK your doctor about these medications         levothyroxine 100 MCG tablet   Commonly known as: SYNTHROID, LEVOTHROID   Take 100 mcg by mouth daily.      meloxicam 15 MG tablet   Commonly known as: MOBIC   Take 7.5 mg by mouth daily as needed. For shoulder pain      olmesartan-hydrochlorothiazide 40-25 MG per tablet   Commonly known as: BENICAR HCT   Take 0.5 tablets by mouth daily.           Follow-up Information    Follow up with Redmond Baseman, MD. Schedule an appointment as soon as possible for a visit in 1 week.   Contact information:   [address] [place] [address] [phone]           The results of significant diagnostics from this hospitalization (including imaging, microbiology, ancillary and laboratory) are listed below for reference.    Significant Diagnostic Studies: Dg Chest Portable 1 View  11/02/2011  *RADIOLOGY REPORT*  Clinical Data: Chest pain.  Tachycardia.  PORTABLE CHEST - 1 VIEW  Comparison: 06/15/2009  Findings: Cardiac and mediastinal contours appear normal.  The lungs appear clear.  No pleural effusion is identified.   IMPRESSION:  No significant abnormality identified.   Original Report Authenticated By: Dellia Cloud, M.D.     Microbiology: No results found for this or any previous visit (from the past 240 hour(s)).   Labs: Basic Metabolic Panel:  Lab 11/03/11 9147 11/02/11 1500  NA 148* 139  K 4.2 3.2*  CL 115* 100  CO2 26 26  GLUCOSE 86 147*  BUN 14 17  CREATININE 0.90 1.06  CALCIUM 8.9 9.6  MG -- 2.0  PHOS -- --   Liver Function Tests:  Lab 11/02/11 1500  AST 15  ALT 12  ALKPHOS 71  BILITOT 0.2*  PROT 7.1  ALBUMIN 4.0    Lab 11/03/11 1338  LIPASE 61*  AMYLASE --   No results found for this basename: AMMONIA:5 in the last 168 hours CBC:  Lab 11/03/11 0455 11/02/11 1500  WBC 5.9 8.6  NEUTROABS -- --  HGB 11.6* 13.2  HCT 35.1* 37.7  MCV 85.6 83.4  PLT 266 268   Cardiac Enzymes:  Lab 11/03/11 0455 11/02/11 2104  CKTOTAL -- --  CKMB -- --  CKMBINDEX -- --  TROPONINI <0.30 <0.30   BNP: BNP (last 3 results) No results found for this basename: PROBNP:3 in the last 8760 hours CBG: No results found for this basename: GLUCAP:5 in the last 168 hours  Time coordinating discharge: 45 minutes  Signed:  Alessia Gonsalez  [facility] 11/04/2011, 12:15 PM

## 2011-11-04 NOTE — Progress Notes (Signed)
 Guy De Gent, MD, FACC ABIM Board Certified in Adult Cardiovascular Medicine,Internal Medicine and Critical Care Medicine      Subjective:    Doing better no recurrent SCP. Stable. No SOB. Came to discuss results of ECHO and nuclear perfusion study.     Objective:   Weight Range:  Vital Signs:   Temp:  [97.6 F (36.4 C)-97.9 F (36.6 C)] 97.9 F (36.6 C) (09/01 0500) Pulse Rate:  [57-150] 97  (09/01 1001) Resp:  [18-20] 18  (09/01 0500) BP: (121-197)/(58-85) 161/83 mmHg (09/01 1003) SpO2:  [96 %-98 %] 96 % (09/01 0500) Weight:  [156 lb 15.5 oz (71.2 kg)] 156 lb 15.5 oz (71.2 kg) (09/01 0500) Last BM Date: 11/04/11  Weight change: Filed Weights   11/02/11 2100 11/04/11 0500  Weight: 157 lb 6.4 oz (71.396 kg) 156 lb 15.5 oz (71.2 kg)    Intake/Output:   Intake/Output Summary (Last 24 hours) at 11/04/11 1131 Last data filed at 11/04/11 1000  Gross per 24 hour  Intake    600 ml  Output   2050 ml  Net  -1450 ml     Physical Exam:  Not performed today   Telemetry: NSR  Labs: Basic Metabolic Panel:  Lab 11/03/11 0455 11/02/11 1500  NA 148* 139  K 4.2 3.2*  CL 115* 100  CO2 26 26  GLUCOSE 86 147*  BUN 14 17  CREATININE 0.90 1.06  CALCIUM 8.9 9.6  MG -- 2.0  PHOS -- --    Liver Function Tests:  Lab 11/02/11 1500  AST 15  ALT 12  ALKPHOS 71  BILITOT 0.2*  PROT 7.1  ALBUMIN 4.0    Lab 11/03/11 1338  LIPASE 61*  AMYLASE --   No results found for this basename: AMMONIA:3 in the last 168 hours  CBC:  Lab 11/03/11 0455 11/02/11 1500  WBC 5.9 8.6  NEUTROABS -- --  HGB 11.6* 13.2  HCT 35.1* 37.7  MCV 85.6 83.4  PLT 266 268    Cardiac Enzymes:  Lab 11/03/11 0455 11/02/11 2104  CKTOTAL -- --  CKMB -- --  CKMBINDEX -- --  TROPONINI <0.30 <0.30     ECHO:  - Left ventricle: The cavity size was normal. Wall thickness was normal. Systolic function was normal. The estimated ejection fraction was in the range of 60% to 65%.  Wall motion was normal; there were no regional wall motion abnormalities. Left ventricular diastolic function parameters were normal. - Atrial septum: No defect or patent foramen ovale was identified.   Other results: EKG: NSR- otherwise normal/no ischemia Imaging: Dg Chest Portable 1 View  11/02/2011  *RADIOLOGY REPORT*  Clinical Data: Chest pain.  Tachycardia.  PORTABLE CHEST - 1 VIEW  Comparison: 06/15/2009  Findings: Cardiac and mediastinal contours appear normal.  The lungs appear clear.  No pleural effusion is identified.  IMPRESSION:  No significant abnormality identified.   Original Report Authenticated By: WALTER D. LIEBKEMANN, M.D.       Medications:     Scheduled Medications:    . alum & mag hydroxide-simeth  30 mL Oral Once  . aspirin EC  81 mg Oral Daily  . atorvastatin  20 mg Oral q1800  . hydrochlorothiazide  25 mg Oral Daily  . irbesartan  300 mg Oral Daily  . levothyroxine  100 mcg Oral QAC breakfast  . metoprolol tartrate  25 mg Oral BID  . pantoprazole  40 mg Oral BID AC  . regadenoson  0.4 mg Intravenous Once  .   DISCONTD: aspirin  324 mg Oral Once  . DISCONTD:  morphine injection  2 mg Intravenous Once  . DISCONTD: pantoprazole (PROTONIX) IV  40 mg Intravenous Q12H     Infusions:     PRN Medications:  acetaminophen, nitroGLYCERIN, ondansetron (ZOFRAN) IV, technetium tetrofosmin, technetium tetrofosmin, zolpidem   Assessment:   1. Palpitation started on BB - may need OP Cardionet monitor to F/U  2. EKG abnormalities non specific, but anormal CV test marked SR depression inferolateral w/o SCP. Nuclear perfusion imaging small anteroapical defect partially reverible- could represent shifting breast tissue- normal EF  3 Hypokalemia   4. Chest pain - typical and atypical features  6. Anemia   7. Hypertension - not controlled in- hospital ARB on hold.    Patient Active Problem List  Diagnosis  . Hypertension  . Fibromyalgia  . Thyroid  disease- TSH normal 8/30  . Palpitations  . Chest pain  . Anemia  . Hypernatremia  . Hyperchloremia 8/30 LDL= 110 HDL =56 TG=57      Plan/Discussion:    Plan to discharge patient on mome meds for HTN, with addition low dose BB and ASA. PRN NTG for SCP/burning Suspect FALSE POSITIVE nuclear study, but given FHx will need definitve diagnosis- I tried to reassure the patient and truly suspect she has no significant disease and I am comfortable with OP JV cath. Patient can contact me at [phone] if questions or Dalton McLean. Will try to have Dr. McLean do cath on Friday if available. Patient has IVP dye allergy and she will be pretreated with PDN/benadryl/Zantac for 3 doses prior to catheterization .  I discussed the risks and benefits of a diagnostic cardiac catheterization with the patient.   We also discussed the radial versus femoral approach.  In particular I quoted that the risk of bleeding from the radial artery is usually less than 1%.  I also explained however that the procedure is more challenging for the cardiologist and does involve a slightly greater radiation exposure although scientist estimate that increased radiation is equivalent to 20 chest x-rays representing only a small risk of the patient.   The following general risks were quoted to the patient for a diagnostic cardiac catheterization:     Length of Stay: 2   Guy De Gent 11/04/2011, 11:31 AM  

## 2011-11-05 MED FILL — Regadenoson IV Inj 0.4 MG/5ML (0.08 MG/ML): INTRAVENOUS | Qty: 5 | Status: AC

## 2011-11-06 ENCOUNTER — Telehealth: Payer: Self-pay | Admitting: *Deleted

## 2011-11-06 NOTE — Telephone Encounter (Signed)
11/06/11-spoke with patient. Pt states that this morning she had another episode similar to symptoms prior to hospitalization. Pt states she woke this morning with  chest pain and heart racing. She states she took 1 NTG with relief. She is asking for further recommendations. I will review with Dr Shirlee Latch.      Message     OK with me.   ----- Message -----   From: Jacqlyn Krauss, RN   Sent: 11/06/2011 11:13 AM   To: Laurey Morale, MD   Subject: RE: Cardiac Cath in the [facility] Lab       Next week your schedule is 9/9 [facility] Ofc/Off, 9/10 [facility] DOD, 9/11 [facility] Ofc all day, 9/12 [facility] DOD, 9/13 [facility] Ofc/Reader. I am going to try to schedule her for Thurs 9/12 on your [place] DOD day right? Kimberlye Dilger   ----- Message -----   From: Laurey Morale, MD   Sent: 11/06/2011 11:05 AM   To: Jacqlyn Krauss, RN   Subject: RE: Cardiac Cath in the [facility]       I do. Schedule her for J[facility] lab radial as early next week as feasible for me. Any day I am in hospital. She will need premeds for contrast allergy it looks like.       ----- Message -----   From: Jacqlyn Krauss, RN   Sent: 11/06/2011 10:08 AM   To: Laurey Morale, MD   Subject: FW: Cardiac Cath in the [facility] Lab       Dr Shirlee Latch do you know anything about this? It looks like Dr Elease Hashimoto and and Dr Andee Lineman saw her in the hospital. You are off Friday 11/09/11 and I have already scheduled Spero Geralds for you to do at 7:30 that morning. DC says follow-up in [place]. Do you know if she needs cardiology follow-up and if she does which cardiologist she will see? Serapio Edelson    ----- Message -----   From: Paulene Floor   Sent: 11/06/2011 9:54 AM   To: Jacqlyn Krauss, RN   Subject: Cardiac Cath in the [facility] Lab       Per After Hour Voicemail, Patient would like to be scheduled to have her Cath on 11/09/11. Please call back.

## 2011-11-06 NOTE — Telephone Encounter (Signed)
Reviewed with Dr Shirlee Latch. He recommended scheduling [facility] for 11/12/11 in the afternoon. No further recommendations at present. Pt is aware.

## 2011-11-07 ENCOUNTER — Encounter: Payer: Self-pay | Admitting: *Deleted

## 2011-11-07 NOTE — Telephone Encounter (Signed)
LHC radial JV scheduled for 11/12/11 12:30pm.

## 2011-11-07 NOTE — Telephone Encounter (Signed)
Pt aware of cath date and time. Instructions mailed to pt. Dr Shirlee Latch instructed pt to take Prednisone 60mg  12 hours and 1-2 hours prior to cath for contrast allergy. Per Charmaine--no pre cert required.

## 2011-11-08 ENCOUNTER — Other Ambulatory Visit: Payer: Self-pay | Admitting: Cardiology

## 2011-11-08 ENCOUNTER — Telehealth: Payer: Self-pay | Admitting: Cardiology

## 2011-11-08 DIAGNOSIS — R079 Chest pain, unspecified: Secondary | ICD-10-CM

## 2011-11-08 NOTE — Telephone Encounter (Signed)
Spoke with pt's husband. Pt should continue aspirin.

## 2011-11-08 NOTE — Telephone Encounter (Addendum)
Pt has appt for cath on Monday, does she need to continue baby asa? [phone] ok to leave a message

## 2011-11-12 ENCOUNTER — Encounter (HOSPITAL_BASED_OUTPATIENT_CLINIC_OR_DEPARTMENT_OTHER)
Admission: RE | Disposition: A | Discharge status: Home / Self Care | Payer: Self-pay | Source: Ambulatory Visit | Attending: Cardiology

## 2011-11-12 ENCOUNTER — Inpatient Hospital Stay (HOSPITAL_BASED_OUTPATIENT_CLINIC_OR_DEPARTMENT_OTHER)
Admission: RE | Admit: 2011-11-12 | Discharge: 2011-11-12 | Disposition: A | Discharge status: Home / Self Care | Payer: BC Managed Care – PPO | Source: Ambulatory Visit | Attending: Cardiology | Admitting: Cardiology

## 2011-11-12 ENCOUNTER — Telehealth: Payer: Self-pay | Admitting: *Deleted

## 2011-11-12 ENCOUNTER — Encounter (HOSPITAL_BASED_OUTPATIENT_CLINIC_OR_DEPARTMENT_OTHER): Payer: Self-pay

## 2011-11-12 ENCOUNTER — Encounter: Payer: Self-pay | Admitting: *Deleted

## 2011-11-12 DIAGNOSIS — R079 Chest pain, unspecified: Secondary | ICD-10-CM

## 2011-11-12 DIAGNOSIS — I2510 Atherosclerotic heart disease of native coronary artery without angina pectoris: Secondary | ICD-10-CM

## 2011-11-12 DIAGNOSIS — Z8249 Family history of ischemic heart disease and other diseases of the circulatory system: Secondary | ICD-10-CM | POA: Insufficient documentation

## 2011-11-12 DIAGNOSIS — R002 Palpitations: Secondary | ICD-10-CM | POA: Insufficient documentation

## 2011-11-12 HISTORY — PX: CARDIAC CATHETERIZATION: SHX172

## 2011-11-12 SURGERY — JV LEFT HEART CATHETERIZATION WITH CORONARY ANGIOGRAM
Anesthesia: Moderate Sedation

## 2011-11-12 MED ORDER — SODIUM CHLORIDE 0.9 % IJ SOLN: 3.0000 mL | Freq: Two times a day (BID) | INTRAMUSCULAR | Status: DC

## 2011-11-12 MED ORDER — ASPIRIN 81 MG PO CHEW
324.0000 mg | CHEWABLE_TABLET | ORAL | Status: AC
  Administered 2011-11-12: 324 mg via ORAL

## 2011-11-12 MED ORDER — ACETAMINOPHEN 325 MG PO TABS: 650.0000 mg | ORAL_TABLET | ORAL | Status: DC | PRN

## 2011-11-12 MED ORDER — ONDANSETRON HCL 4 MG/2ML IJ SOLN: 4.0000 mg | Freq: Four times a day (QID) | INTRAMUSCULAR | Status: DC | PRN

## 2011-11-12 MED ORDER — SODIUM CHLORIDE 0.9 % IV SOLN: 250.0000 mL | INTRAVENOUS | Status: DC | PRN

## 2011-11-12 MED ORDER — SODIUM CHLORIDE 0.9 % IV SOLN
INTRAVENOUS | Status: DC
  Administered 2011-11-12: 11:00:00 via INTRAVENOUS

## 2011-11-12 MED ORDER — SODIUM CHLORIDE 0.9 % IJ SOLN: 3.0000 mL | INTRAMUSCULAR | Status: DC | PRN

## 2011-11-12 MED ORDER — SODIUM CHLORIDE 0.9 % IV SOLN: INTRAVENOUS | Status: DC

## 2011-11-12 NOTE — Progress Notes (Signed)
TR band removed, site level 0, tegaderm dressing applied and right wrist immobilizer.  Discharged to home.

## 2011-11-12 NOTE — Interval H&P Note (Signed)
History and Physical Interval Note:  11/12/2011 12:38 PM  Julie [name]  has presented today for surgery, with the diagnosis of chest pain  The various methods of treatment have been discussed with the patient and family. After consideration of risks, benefits and other options for treatment, the patient has consented to  Procedure(s) (LRB) with comments: JV LEFT HEART CATHETERIZATION WITH CORONARY ANGIOGRAM (N/A) - radial cath as a surgical intervention .  The patient's history has been reviewed, patient examined, no change in status, stable for surgery.  I have reviewed the patient's chart and labs.  Questions were answered to the patient's satisfaction.     Shaneca Orne [employer]

## 2011-11-12 NOTE — Telephone Encounter (Signed)
Received call from pt who states she received call from Cath lab asking her to arrive 30 minutes earlier than planned arrival time. Pt is asking if she should take prednisone earlier. I told her she should take it 30 minutes earlier than previously planned.

## 2011-11-12 NOTE — CV Procedure (Signed)
   Cardiac Catheterization Procedure Note  Name: Julie [name] MRN: [id number] DOB: Jan 06, 1956  Procedure: Left Heart Cath, Selective Coronary Angiography, LV angiography  Indication: Chest pain, family history of CAD   Procedural Details: The right wrist was prepped, draped, and anesthetized with 1% lidocaine. Using the modified Seldinger technique, a 5 French sheath was introduced into the right radial artery. 3 mg of verapamil was administered through the sheath, weight-based unfractionated heparin was administered intravenously. Standard Judkins catheters were used for selective coronary angiography and left ventriculography. Catheter exchanges were performed over an exchange length guidewire. There were no immediate procedural complications. A TR band was used for radial hemostasis at the completion of the procedure.  The patient was transferred to the post catheterization recovery area for further monitoring.  Procedural Findings: Hemodynamics: AO 135/72 LV 141/11  Coronary angiography: Coronary dominance: right  Left mainstem: Short, no significant CAD.  Left anterior descending (LAD): 20% stenosis proximal LAD at 1st diagonal.  Small 1st diagonal with about 40% ostial stenosis.   Left circumflex (LCx): No significant CAD.   Right coronary artery (RCA): No significant CAD  Left ventriculography: Left ventricular systolic function is vigorous, LVEF is estimated at 65-70%, there is no significant mitral regurgitation   Final Conclusions:  No obstructive CAD.   Recommendations: Chest pain is not due to epicardial coronary disease.  She also has significant palpitations.  I will arrange 48 hour holter monitor and see her in 2 wks in the office.   [name] 11/12/2011, 1:03 PM

## 2011-11-12 NOTE — H&P (View-Only) (Signed)
Julie Bottoms, MD, [facility] ABIM Board Certified in Adult Cardiovascular Medicine,Internal Medicine and Critical Care Medicine      Subjective:    Doing better no recurrent SCP. Stable. No SOB. Came to discuss results of ECHO and nuclear perfusion study.     Objective:   Weight Range:  Vital Signs:   Temp:  [97.6 F (36.4 C)-97.9 F (36.6 C)] 97.9 F (36.6 C) (09/01 0500) Pulse Rate:  [57-150] 97  (09/01 1001) Resp:  [18-20] 18  (09/01 0500) BP: (121-197)/(58-85) 161/83 mmHg (09/01 1003) SpO2:  [96 %-98 %] 96 % (09/01 0500) Weight:  [156 lb 15.5 oz (71.2 kg)] 156 lb 15.5 oz (71.2 kg) (09/01 0500) Last BM Date: 11/04/11  Weight change: Filed Weights   11/02/11 2100 11/04/11 0500  Weight: 157 lb 6.4 oz (71.396 kg) 156 lb 15.5 oz (71.2 kg)    Intake/Output:   Intake/Output Summary (Last 24 hours) at 11/04/11 1131 Last data filed at 11/04/11 1000  Gross per 24 hour  Intake    600 ml  Output   2050 ml  Net  -1450 ml     Physical Exam:  Not performed today   Telemetry: NSR  Labs: Basic Metabolic Panel:  Lab 11/03/11 1610 11/02/11 1500  NA 148* 139  K 4.2 3.2*  CL 115* 100  CO2 26 26  GLUCOSE 86 147*  BUN 14 17  CREATININE 0.90 1.06  CALCIUM 8.9 9.6  MG -- 2.0  PHOS -- --    Liver Function Tests:  Lab 11/02/11 1500  AST 15  ALT 12  ALKPHOS 71  BILITOT 0.2*  PROT 7.1  ALBUMIN 4.0    Lab 11/03/11 1338  LIPASE 61*  AMYLASE --   No results found for this basename: AMMONIA:3 in the last 168 hours  CBC:  Lab 11/03/11 0455 11/02/11 1500  WBC 5.9 8.6  NEUTROABS -- --  HGB 11.6* 13.2  HCT 35.1* 37.7  MCV 85.6 83.4  PLT 266 268    Cardiac Enzymes:  Lab 11/03/11 0455 11/02/11 2104  CKTOTAL -- --  CKMB -- --  CKMBINDEX -- --  TROPONINI <0.30 <0.30     ECHO:  - Left ventricle: The cavity size was normal. Wall thickness was normal. Systolic function was normal. The estimated ejection fraction was in the range of 60% to 65%.  Wall motion was normal; there were no regional wall motion abnormalities. Left ventricular diastolic function parameters were normal. - Atrial septum: No defect or patent foramen ovale was identified.   Other results: EKG: NSR- otherwise normal/no ischemia Imaging: Dg Chest Portable 1 View  11/02/2011  *RADIOLOGY REPORT*  Clinical Data: Chest pain.  Tachycardia.  PORTABLE CHEST - 1 VIEW  Comparison: 06/15/2009  Findings: Cardiac and mediastinal contours appear normal.  The lungs appear clear.  No pleural effusion is identified.  IMPRESSION:  No significant abnormality identified.   Original Report Authenticated By: Dellia Cloud, M.D.       Medications:     Scheduled Medications:    . alum & mag hydroxide-simeth  30 mL Oral Once  . aspirin EC  81 mg Oral Daily  . atorvastatin  20 mg Oral q1800  . hydrochlorothiazide  25 mg Oral Daily  . irbesartan  300 mg Oral Daily  . levothyroxine  100 mcg Oral QAC breakfast  . metoprolol tartrate  25 mg Oral BID  . pantoprazole  40 mg Oral BID AC  . regadenoson  0.4 mg Intravenous Once  .  DISCONTD: aspirin  324 mg Oral Once  . DISCONTD:  morphine injection  2 mg Intravenous Once  . DISCONTD: pantoprazole (PROTONIX) IV  40 mg Intravenous Q12H     Infusions:     PRN Medications:  acetaminophen, nitroGLYCERIN, ondansetron (ZOFRAN) IV, technetium tetrofosmin, technetium tetrofosmin, zolpidem   Assessment:   1. Palpitation started on BB - may need OP Cardionet monitor to F/U  2. EKG abnormalities non specific, but anormal CV test marked SR depression inferolateral w/o SCP. Nuclear perfusion imaging small anteroapical defect partially reverible- could represent shifting breast tissue- normal EF  3 Hypokalemia   4. Chest pain - typical and atypical features  6. Anemia   7. Hypertension - not controlled in- hospital ARB on hold.    Patient Active Problem List  Diagnosis  . Hypertension  . Fibromyalgia  . Thyroid  disease- TSH normal 8/30  . Palpitations  . Chest pain  . Anemia  . Hypernatremia  . Hyperchloremia 8/30 LDL= 110 HDL =56 TG=57      Plan/Discussion:    Plan to discharge patient on mome meds for HTN, with addition low dose BB and ASA. PRN NTG for SCP/burning Suspect FALSE POSITIVE nuclear study, but given FHx will need definitve diagnosis- I tried to reassure the patient and truly suspect she has no significant disease and I am comfortable with OP JV cath. Patient can contact me at [phone] if questions or Julie [name]. Will try to have Dr. Shirlee Latch do cath on Friday if available. Patient has IVP dye allergy and she will be pretreated with PDN/benadryl/Zantac for 3 doses prior to catheterization .  I discussed the risks and benefits of a diagnostic cardiac catheterization with the patient.   We also discussed the radial versus femoral approach.  In particular I quoted that the risk of bleeding from the radial artery is usually less than 1%.  I also explained however that the procedure is more challenging for the cardiologist and does involve a slightly greater radiation exposure although scientist estimate that increased radiation is equivalent to 20 chest x-rays representing only a small risk of the patient.   The following general risks were quoted to the patient for a diagnostic cardiac catheterization:     Length of Stay: 2   Julie [name] 11/04/2011, 11:31 AM

## 2011-11-21 ENCOUNTER — Encounter: Payer: Self-pay | Admitting: *Deleted

## 2011-11-29 ENCOUNTER — Ambulatory Visit (INDEPENDENT_AMBULATORY_CARE_PROVIDER_SITE_OTHER): Payer: BC Managed Care – PPO | Admitting: Cardiology

## 2011-11-29 ENCOUNTER — Encounter: Payer: Self-pay | Admitting: Cardiology

## 2011-11-29 VITALS — BP 130/88 | HR 78 | Ht 64.0 in | Wt 157.0 lb

## 2011-11-29 DIAGNOSIS — R079 Chest pain, unspecified: Secondary | ICD-10-CM

## 2011-11-29 DIAGNOSIS — I10 Essential (primary) hypertension: Secondary | ICD-10-CM

## 2011-11-29 DIAGNOSIS — R002 Palpitations: Secondary | ICD-10-CM

## 2011-11-29 MED ORDER — ATORVASTATIN CALCIUM 10 MG PO TABS: 10.0000 mg | ORAL_TABLET | Freq: Every day | ORAL | Status: DC

## 2011-11-29 MED ORDER — LISINOPRIL-HYDROCHLOROTHIAZIDE 20-25 MG PO TABS: 1.0000 | ORAL_TABLET | Freq: Every day | ORAL | Status: DC

## 2011-11-29 NOTE — Patient Instructions (Addendum)
Stop Benicar/HCT.  Start lisinopril/HCT 20/25mg  daily.  Your physician recommends that you return for lab work in: 2 weeks--BMET.  Take and record your blood pressure daily. I will call you in about 2 weeks and get the readings. Luana Shu [phone]   Decrease atorvastatin to 10mg  daily. You can take 1/2 of your 20mg  tablet and use your current supply.   Your  physician recommends that you return for a FASTING lipid profile/liver profile in 2 months.    Your physician wants you to follow-up in: 1 year with Dr Shirlee Latch. (September 2014). You will receive a reminder letter in the mail two months in advance. If you don't receive a letter, please call our office to schedule the follow-up appointment.

## 2011-11-30 NOTE — Progress Notes (Signed)
Patient ID: Julie [name], female   DOB: 05/06/1955, 56 y.o.   MRN: [id number] PCP: Dr. Modesto Charon  56 yo with history of HTN presents for followup of chest pain and palpitations.  She was admitted to [facility] on 8/30 with tachypalpitations and chest burning.  ECG showed slight inferolateral ST depression. No arrhythmia was noted though she was in sinus tachycardia.  Myoview was done, showing a mid-apical anterior primarily fixed defect.  Given ongoing chest pain episodes, l]did a left heart cath on her, which showed normal EF and no significant CAD.  Holter showed occasional PVCs, no more worrisome arrhythmias.  Dr. Modesto Charon started her on Toprol XL, which seems to have helped.  Palpitations have resolved for the most part.  She has cut out caffeine.  No further chest pain.  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  At baseline, she has good exercise tolerance with no exertional dyspnea or chest pain.   Labs (8/13): LDL 110, K 4, creatinine 0.9  PMH: 1. HTN 2. Fibromyalgia 3. H/o thyroidectomy with hypothyroidism 4. Palpitations: Holter (9/13) with occasional PVCs, mainly NSR.  5. Chest pain: Myoview (9/13) with EF 72%, mid-apical anterior primarily fixed perfusion defect.  LHC with EF 65-70%, no significant CAD.   SH: Married, school principal, nonsmoker.    FH: Father with multiple MIs, 1st at 57.  HTN.  ROS: All systems reviewed and negative except as per HPI.   Current Outpatient Prescriptions  Medication Sig Dispense Refill  . aspirin EC 81 MG EC tablet Take 1 tablet (81 mg total) by mouth daily.  90 tablet  3  . levothyroxine (SYNTHROID, LEVOTHROID) 100 MCG tablet Take 100 mcg by mouth daily.      . metoprolol succinate (TOPROL XL) 25 MG 24 hr tablet Take 1 tablet (25 mg total) by mouth daily.  90 tablet  3  . nitroGLYCERIN (NITROSTAT) 0.4 MG SL tablet Place 1 tablet (0.4 mg total) under the tongue every 5 (five) minutes as needed for chest pain.  20 tablet  0  .  atorvastatin (LIPITOR) 10 MG tablet Take 1 tablet (10 mg total) by mouth daily.  30 tablet  3  . lisinopril-hydrochlorothiazide (PRINZIDE,ZESTORETIC) 20-25 MG per tablet Take 1 tablet by mouth daily.  30 tablet  11    BP 130/88  Pulse 78  Ht 5\' 4"  (1.626 m)  Wt 157 lb (71.215 kg)  BMI 26.95 kg/m2 General: NAD Neck: No JVD, no thyromegaly or thyroid nodule.  Lungs: Clear to auscultation bilaterally with normal respiratory effort. CV: Nondisplaced PMI.  Heart regular S1/S2, no S3/S4, no murmur.  No peripheral edema.  No carotid bruit.  Normal pedal pulses.  Abdomen: Soft, nontender, no hepatosplenomegaly, no distention.  Skin: Intact without lesions or rashes.  Neurologic: Alert and oriented x 3.  Psych: Normal affect. Extremities: No clubbing or cyanosis.  HEENT: Normal.   Assessment/Plan: 1. Chest pain: Possibly related to palpitations, versus related to stress.  No significant CAD on cath.  Would continue ASA 81 given family history of premature CAD.  2. HTN: Benicar/HCT is too expensive.  I will switch her to lisinopril/HCT 20/25 (she says she has not had any problems in the past with ACEIs).  BMET and BP check in 2 wks.  3. Hyperlipidemia: She was started on atorvastatin in the hospital.  Cath has shown no significant CAD.  I talked to her about what to do with the statin.  GIven her strong  family history of CAD, she wants to continue it.  I will decrease the atorvastatin, however, to 10 mg daily daily.  4. Palpitations: Likely PVCs which worsen under conditions of stress.  Would stay off caffeine.  She is doing better on Toprol XL, would continue.   Dalton [employer]

## 2011-12-14 ENCOUNTER — Other Ambulatory Visit (INDEPENDENT_AMBULATORY_CARE_PROVIDER_SITE_OTHER): Payer: BC Managed Care – PPO

## 2011-12-14 DIAGNOSIS — I10 Essential (primary) hypertension: Secondary | ICD-10-CM

## 2011-12-14 LAB — LIPID PANEL
Cholesterol: 157 mg/dL (ref 0–200)
LDL Cholesterol: 94 mg/dL (ref 0–99)
Total CHOL/HDL Ratio: 3

## 2011-12-14 LAB — BASIC METABOLIC PANEL
BUN: 19 mg/dL (ref 6–23)
Chloride: 101 mEq/L (ref 96–112)
Potassium: 4 mEq/L (ref 3.5–5.1)

## 2011-12-14 LAB — HEPATIC FUNCTION PANEL
ALT: 15 U/L (ref 0–35)
AST: 17 U/L (ref 0–37)
Total Bilirubin: 0.4 mg/dL (ref 0.3–1.2)
Total Protein: 7.3 g/dL (ref 6.0–8.3)

## 2012-04-16 ENCOUNTER — Encounter: Payer: Self-pay | Admitting: Cardiology

## 2012-06-25 ENCOUNTER — Emergency Department (HOSPITAL_COMMUNITY): Payer: BC Managed Care – PPO

## 2012-06-25 ENCOUNTER — Observation Stay (HOSPITAL_COMMUNITY)
Admission: EM | Admit: 2012-06-25 | Discharge: 2012-06-26 | Disposition: A | Discharge status: Home / Self Care | Payer: BC Managed Care – PPO | Attending: Internal Medicine | Admitting: Internal Medicine

## 2012-06-25 ENCOUNTER — Encounter (HOSPITAL_COMMUNITY): Payer: Self-pay | Admitting: Emergency Medicine

## 2012-06-25 DIAGNOSIS — R079 Chest pain, unspecified: Principal | ICD-10-CM

## 2012-06-25 DIAGNOSIS — I10 Essential (primary) hypertension: Secondary | ICD-10-CM | POA: Diagnosis present

## 2012-06-25 DIAGNOSIS — E039 Hypothyroidism, unspecified: Secondary | ICD-10-CM

## 2012-06-25 DIAGNOSIS — F411 Generalized anxiety disorder: Secondary | ICD-10-CM

## 2012-06-25 DIAGNOSIS — R002 Palpitations: Secondary | ICD-10-CM

## 2012-06-25 DIAGNOSIS — R112 Nausea with vomiting, unspecified: Secondary | ICD-10-CM | POA: Insufficient documentation

## 2012-06-25 DIAGNOSIS — D649 Anemia, unspecified: Secondary | ICD-10-CM

## 2012-06-25 DIAGNOSIS — K219 Gastro-esophageal reflux disease without esophagitis: Secondary | ICD-10-CM

## 2012-06-25 DIAGNOSIS — M797 Fibromyalgia: Secondary | ICD-10-CM | POA: Diagnosis present

## 2012-06-25 DIAGNOSIS — IMO0001 Reserved for inherently not codable concepts without codable children: Secondary | ICD-10-CM | POA: Insufficient documentation

## 2012-06-25 DIAGNOSIS — E079 Disorder of thyroid, unspecified: Secondary | ICD-10-CM

## 2012-06-25 LAB — BASIC METABOLIC PANEL
BUN: 25 mg/dL — ABNORMAL HIGH (ref 6–23)
BUN: 21 mg/dL (ref 6–23)
CO2: 28 mEq/L (ref 19–32)
Calcium: 9 mg/dL (ref 8.4–10.5)
Chloride: 100 mEq/L (ref 96–112)
Creatinine, Ser: 1.08 mg/dL (ref 0.50–1.10)
Creatinine, Ser: 0.94 mg/dL (ref 0.50–1.10)
GFR calc non Af Amer: 55 mL/min — ABNORMAL LOW (ref >90)
Glucose, Bld: 157 mg/dL — ABNORMAL HIGH (ref 70–99)
Potassium: 4.3 mEq/L (ref 3.5–5.1)

## 2012-06-25 LAB — TSH: TSH: 2.533 u[IU]/mL (ref 0.350–4.500)

## 2012-06-25 LAB — CBC
HCT: 30.8 % — ABNORMAL LOW (ref 36.0–46.0)
HCT: 31.1 % — ABNORMAL LOW (ref 36.0–46.0)
Hemoglobin: 10.8 g/dL — ABNORMAL LOW (ref 12.0–15.0)
Hemoglobin: 11.2 g/dL — ABNORMAL LOW (ref 12.0–15.0)
MCH: 29.1 pg (ref 26.0–34.0)
MCHC: 35.1 g/dL (ref 30.0–36.0)
MCHC: 36 g/dL (ref 30.0–36.0)
MCV: 80.8 fL (ref 78.0–100.0)
MCV: 81.9 fL (ref 78.0–100.0)
RDW: 12.8 % (ref 11.5–15.5)

## 2012-06-25 LAB — HEPATIC FUNCTION PANEL
AST: 12 U/L (ref 0–37)
Albumin: 3.4 g/dL — ABNORMAL LOW (ref 3.5–5.2)
Alkaline Phosphatase: 59 U/L (ref 39–117)
Bilirubin, Direct: <0.1 mg/dL (ref 0.0–0.3)
Total Bilirubin: 0.1 mg/dL — ABNORMAL LOW (ref 0.3–1.2)

## 2012-06-25 LAB — HEMOGLOBIN A1C: Hgb A1c MFr Bld: 5.6 % (ref <5.7)

## 2012-06-25 MED ORDER — LABETALOL HCL 200 MG PO TABS
200.0000 mg | ORAL_TABLET | Freq: Two times a day (BID) | ORAL | Status: DC
  Administered 2012-06-25 – 2012-06-26 (×3): 200 mg via ORAL
  Filled 2012-06-25 (×5): qty 1

## 2012-06-25 MED ORDER — ATORVASTATIN CALCIUM 10 MG PO TABS
10.0000 mg | ORAL_TABLET | Freq: Every day | ORAL | Status: DC
  Administered 2012-06-25 – 2012-06-26 (×2): 10 mg via ORAL
  Filled 2012-06-25 (×2): qty 1

## 2012-06-25 MED ORDER — CYCLOSPORINE 0.05 % OP EMUL
1.0000 [drp] | Freq: Two times a day (BID) | OPHTHALMIC | Status: DC
  Administered 2012-06-25 – 2012-06-26 (×3): 1 [drp] via OPHTHALMIC
  Filled 2012-06-25 (×4): qty 1

## 2012-06-25 MED ORDER — PANTOPRAZOLE SODIUM 40 MG PO TBEC
40.0000 mg | DELAYED_RELEASE_TABLET | Freq: Two times a day (BID) | ORAL | Status: DC
  Administered 2012-06-25 – 2012-06-26 (×3): 40 mg via ORAL
  Filled 2012-06-25 (×3): qty 1

## 2012-06-25 MED ORDER — HYDROCHLOROTHIAZIDE 25 MG PO TABS
25.0000 mg | ORAL_TABLET | Freq: Every day | ORAL | Status: DC
  Administered 2012-06-25: 25 mg via ORAL
  Filled 2012-06-25: qty 1

## 2012-06-25 MED ORDER — ACETAMINOPHEN 325 MG PO TABS: 650.0000 mg | ORAL_TABLET | Freq: Four times a day (QID) | ORAL | Status: DC | PRN

## 2012-06-25 MED ORDER — LISINOPRIL-HYDROCHLOROTHIAZIDE 20-25 MG PO TABS: 1.0000 | ORAL_TABLET | Freq: Every day | ORAL | Status: DC

## 2012-06-25 MED ORDER — ASPIRIN EC 325 MG PO TBEC
325.0000 mg | DELAYED_RELEASE_TABLET | Freq: Every day | ORAL | Status: DC
  Administered 2012-06-25: 325 mg via ORAL
  Filled 2012-06-25: qty 1

## 2012-06-25 MED ORDER — PROMETHAZINE HCL 25 MG/ML IJ SOLN
12.5000 mg | Freq: Once | INTRAMUSCULAR | Status: AC
  Administered 2012-06-25: 12.5 mg via INTRAVENOUS
  Filled 2012-06-25 (×2): qty 1

## 2012-06-25 MED ORDER — SODIUM CHLORIDE 0.9 % IJ SOLN
3.0000 mL | Freq: Two times a day (BID) | INTRAMUSCULAR | Status: DC
  Administered 2012-06-25 – 2012-06-26 (×3): 3 mL via INTRAVENOUS

## 2012-06-25 MED ORDER — METOPROLOL SUCCINATE 12.5 MG HALF TABLET
12.5000 mg | ORAL_TABLET | Freq: Three times a day (TID) | ORAL | Status: DC
Start: 2012-06-25 — End: 2012-06-25
  Administered 2012-06-25: 12.5 mg via ORAL
  Filled 2012-06-25 (×3): qty 1

## 2012-06-25 MED ORDER — ENOXAPARIN SODIUM 40 MG/0.4ML ~~LOC~~ SOLN
40.0000 mg | SUBCUTANEOUS | Status: DC
  Administered 2012-06-25 – 2012-06-26 (×2): 40 mg via SUBCUTANEOUS
  Filled 2012-06-25 (×3): qty 0.4

## 2012-06-25 MED ORDER — NITROGLYCERIN 0.4 MG SL SUBL
0.4000 mg | SUBLINGUAL_TABLET | SUBLINGUAL | Status: DC | PRN
  Administered 2012-06-25: 0.4 mg via SUBLINGUAL
  Filled 2012-06-25: qty 75
  Filled 2012-06-25: qty 25
  Filled 2012-06-25: qty 75

## 2012-06-25 MED ORDER — ASPIRIN EC 81 MG PO TBEC
81.0000 mg | DELAYED_RELEASE_TABLET | Freq: Every day | ORAL | Status: DC
Start: 2012-06-26 — End: 2012-06-26
  Administered 2012-06-26: 81 mg via ORAL
  Filled 2012-06-25: qty 1

## 2012-06-25 MED ORDER — ONDANSETRON HCL 4 MG/2ML IJ SOLN
4.0000 mg | Freq: Once | INTRAMUSCULAR | Status: AC
  Administered 2012-06-25: 4 mg via INTRAVENOUS
  Filled 2012-06-25: qty 2

## 2012-06-25 MED ORDER — SODIUM CHLORIDE 0.9 % IV SOLN
INTRAVENOUS | Status: DC
  Administered 2012-06-25: 04:00:00 via INTRAVENOUS

## 2012-06-25 MED ORDER — LEVOTHYROXINE SODIUM 88 MCG PO TABS
88.0000 ug | ORAL_TABLET | Freq: Every day | ORAL | Status: DC
  Administered 2012-06-25 – 2012-06-26 (×2): 88 ug via ORAL
  Filled 2012-06-25 (×3): qty 1

## 2012-06-25 MED ORDER — NITROGLYCERIN 2 % TD OINT
1.0000 [in_us] | TOPICAL_OINTMENT | TRANSDERMAL | Status: AC
  Administered 2012-06-25: 1 [in_us] via TOPICAL
  Filled 2012-06-25: qty 1

## 2012-06-25 MED ORDER — ONDANSETRON HCL 4 MG/2ML IJ SOLN: 4.0000 mg | Freq: Four times a day (QID) | INTRAMUSCULAR | Status: DC | PRN

## 2012-06-25 MED ORDER — LISINOPRIL 20 MG PO TABS
20.0000 mg | ORAL_TABLET | Freq: Every day | ORAL | Status: DC
  Administered 2012-06-25: 20 mg via ORAL
  Filled 2012-06-25: qty 1

## 2012-06-25 MED ORDER — ACETAMINOPHEN 650 MG RE SUPP: 650.0000 mg | Freq: Four times a day (QID) | RECTAL | Status: DC | PRN

## 2012-06-25 MED ORDER — ONDANSETRON HCL 4 MG PO TABS: 4.0000 mg | ORAL_TABLET | Freq: Four times a day (QID) | ORAL | Status: DC | PRN
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
## 2012-06-25 NOTE — ED Notes (Addendum)
Pt states that she started having chest pain about 2230. Pt states that the pain started at about 10 and came down with nitro. EMS gave morphine in route for chest pain. Pt nauseated upon arrival. Pt pale upon arrival per EMS pt dropped pressure after last nitro. EMS gave fluid bolus and pt's pressure WNL upon arrival

## 2012-06-25 NOTE — ED Notes (Signed)
MD at bedside. ([place]) 
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
## 2012-06-25 NOTE — ED Notes (Signed)
X-ray at bedside for chest x-ray.

## 2012-06-25 NOTE — Progress Notes (Signed)
Patient seen and examined. Admitted after midnight secondary to chest pain and elevated BP. Patient reports is not the first time she experienced this problem. There also involved symptoms of nausea and vomiting and endorses some burning sensation on epigastric region. Referred to Dr. Katherene Ponto H&P for further admission details.  Plan: -check cortisol level -follow CE's and TSH -start patient on labetalol for better BP control -if everything negative will start patient on buspar; patient reports having some trouble with anxiety.  Tashanti Dalporto [phone]

## 2012-06-25 NOTE — Progress Notes (Signed)
Utilization review completed.  

## 2012-06-26 DIAGNOSIS — I10 Essential (primary) hypertension: Secondary | ICD-10-CM

## 2012-06-26 DIAGNOSIS — F411 Generalized anxiety disorder: Secondary | ICD-10-CM

## 2012-06-26 LAB — BASIC METABOLIC PANEL
BUN: 16 mg/dL (ref 6–23)
CO2: 29 mEq/L (ref 19–32)
GFR calc non Af Amer: 58 mL/min — ABNORMAL LOW (ref >90)
Glucose, Bld: 89 mg/dL (ref 70–99)
Potassium: 4.4 mEq/L (ref 3.5–5.1)
Sodium: 139 mEq/L (ref 135–145)

## 2012-06-26 LAB — CBC
HCT: 31.9 % — ABNORMAL LOW (ref 36.0–46.0)
Hemoglobin: 10.7 g/dL — ABNORMAL LOW (ref 12.0–15.0)
MCHC: 33.5 g/dL (ref 30.0–36.0)
RBC: 3.78 MIL/uL — ABNORMAL LOW (ref 3.87–5.11)
WBC: 4.6 10*3/uL (ref 4.0–10.5)

## 2012-06-26 MED ORDER — LABETALOL HCL 200 MG PO TABS: 200.0000 mg | ORAL_TABLET | Freq: Two times a day (BID) | ORAL | Status: DC

## 2012-06-26 MED ORDER — PANTOPRAZOLE SODIUM 40 MG PO TBEC: 40.0000 mg | DELAYED_RELEASE_TABLET | Freq: Every day | ORAL | Status: DC

## 2012-06-26 MED ORDER — BUSPIRONE HCL 5 MG PO TABS: 5.0000 mg | ORAL_TABLET | Freq: Three times a day (TID) | ORAL | Status: DC

## 2012-06-26 MED ORDER — NITROGLYCERIN 0.4 MG SL SUBL: 0.4000 mg | SUBLINGUAL_TABLET | SUBLINGUAL | Status: DC | PRN

## 2012-06-26 NOTE — Discharge Summary (Signed)
Physician Discharge Summary  Julie [name] WGN:[id number] DOB: 1955/06/22 DOA: 06/25/2012  PCP: Redmond Baseman, MD  Admit date: 06/25/2012 Discharge date: 06/26/2012  Time spent: >30 minutes  Recommendations for Outpatient Follow-up:  -reassess BP and adjust medications as needed -reassess needs for anxiolytic medication adjustments and referral to psychiatry if needed -if further episodes of sudden palpitations, sweating and abnormal labs occurred; will benefit of pheochromocytoma evaluation.  Discharge Diagnoses:  Principal Problem:   Chest pain Active Problems:   Hypertension   Fibromyalgia   Palpitations   Hypothyroidism   Discharge Condition: stable and improved; will be discharged home with follow up with PCP in 7-10 days  Diet recommendation: heart healthy diet  Filed Weights   06/25/12 0022 06/25/12 0415  Weight: 66.679 kg (147 lb) 70.171 kg (154 lb 11.2 oz)    History of present illness:  57 y.o. female this history of hypertension, hypothyroidism and fibromyalgia started developing chest pain and palpitation after she read an e-mail. His pain is retrosternal pressure-like and stabbing along with the patient also had mild shortness of breath and palpitations. She took nitroglycerin which helped her but her symptoms started coming back again. At that point they called EMS. EMS gave additional nitroglycerin and also on a morphine. Her chest pain and palpitation improved. Her EKG showed inferolateral ST depression. Cardiology on call Dr. Eliott Nine was consulted. Patient did have his EKG was previously and has had a cardiac catheter last November 13, 2011 which was negative with normal LV. At this time patient's chest pain has improved and will be admitted for further observation. Patient also has had 2 episodes of nausea and vomiting. Denies any abdominal pain diarrhea fever chills or dysuria.    Hospital Course:  1-Chest pain: negative cardiac enzymes, no EKG or  telemetry changes and no signs of infection on CXR. -started on PPI for GERD -also started on buspar to help treating anxiety -continue ASA and B-blocker -heart healthy diet -ACS R/O  2-HTN: medication exchanged to labetalol for better control. Also advise to follow heart healthy diet.  3-GERD: started on protonix  4-Anxiety: started on Buspar TID. Will need follow up on her symptoms, medication adjustments and if needed follow up with psychiatry   5-Hypothyroidism: stable. Continue synthroid. TSH WNL.  6-HLD: continue lipitor.  Procedures: See below for x-ray reports  Consultations:  none  Discharge Exam: Filed Vitals:   06/25/12 1545 06/25/12 2100 06/26/12 0500 06/26/12 0928  BP:  136/73 109/67 133/74  Pulse: 70 70 56 74  Temp:  97.7 F (36.5 C) 98.1 F (36.7 C)   TempSrc:      Resp:  18 18   Height:      Weight:      SpO2:  97% 97%     General: NAD, denies any further CP or SOB Cardiovascular: S1 and S2, no rubs, no gallops Respiratory: Soft, NT, ND, positive BS Abdomen: soft, NT, ND, positive BS Neuro: non focal  Discharge Instructions  Discharge Orders   Future Orders Complete By Expires     Discharge instructions  As directed     Comments:      Take medications as prescribed Follow with Primary care Physician in 7-10 days Keep yourself hydrated and follow a heart healthy diet.        Medication List    STOP taking these medications       lisinopril-hydrochlorothiazide 20-25 MG per tablet  Commonly known as:  PRINZIDE,ZESTORETIC     metoprolol succinate  25 MG 24 hr tablet  Commonly known as:  TOPROL-XL      TAKE these medications       aspirin 81 MG EC tablet  Take 1 tablet (81 mg total) by mouth daily.     atorvastatin 10 MG tablet  Commonly known as:  LIPITOR  Take 1 tablet (10 mg total) by mouth daily.     busPIRone 5 MG tablet  Commonly known as:  BUSPAR  Take 1 tablet (5 mg total) by mouth 3 (three) times daily.      cycloSPORINE 0.05 % ophthalmic emulsion  Commonly known as:  RESTASIS  Place 1 drop into both eyes 2 (two) times daily.     labetalol 200 MG tablet  Commonly known as:  NORMODYNE  Take 1 tablet (200 mg total) by mouth 2 (two) times daily.     levothyroxine 88 MCG tablet  Commonly known as:  SYNTHROID, LEVOTHROID  Take 88 mcg by mouth daily before breakfast.     nitroGLYCERIN 0.4 MG SL tablet  Commonly known as:  NITROSTAT  Place 1 tablet (0.4 mg total) under the tongue every 5 (five) minutes as needed for chest pain.     pantoprazole 40 MG tablet  Commonly known as:  PROTONIX  Take 1 tablet (40 mg total) by mouth daily.     SYSTANE ULTRA OP  Apply 1 drop to eye at bedtime.     SYSTANE 0.4-0.3 % Gel  Generic drug:  Polyethyl Glycol-Propyl Glycol  Apply 1 application to eye at bedtime.           Follow-up Information   Follow up with Redmond Baseman, MD. Schedule an appointment as soon as possible for a visit in 10 days.   Contact information:   [address] [place] [address] [phone]        The results of significant diagnostics from this hospitalization (including imaging, microbiology, ancillary and laboratory) are listed below for reference.    Significant Diagnostic Studies: Dg Chest Port 1 View  06/25/2012  *RADIOLOGY REPORT*  Clinical Data: Chest pain  PORTABLE CHEST - 1 VIEW  Comparison: 11/02/2011  Findings: Mild right hemidiaphragm elevation. Numerous leads and wires project over the chest.  Midline trachea.  Normal heart size and mediastinal contours. No pleural effusion or pneumothorax. Clear lungs.  IMPRESSION: No acute cardiopulmonary disease.   Original Report Authenticated By: Jeronimo Greaves, M.D.     Microbiology: No results found for this or any previous visit (from the past 240 hour(s)).   Labs: Basic Metabolic Panel:  Recent Labs Lab 06/25/12 0037 06/25/12 0612 06/26/12 0532  NA 135 136 139  K 3.5 4.3 4.4  CL 99 100 101   CO2 26 28 29   GLUCOSE 157* 123* 89  BUN 25* 21 16  CREATININE 1.08 0.94 1.04  CALCIUM 9.0 8.8 9.4   Liver Function Tests:  Recent Labs Lab 06/25/12 0612  AST 12  ALT 8  ALKPHOS 59  BILITOT 0.1*  PROT 6.3  ALBUMIN 3.4*   CBC:  Recent Labs Lab 06/25/12 0037 06/25/12 0612 06/26/12 0532  WBC 8.7 8.2 4.6  HGB 11.2* 10.8* 10.7*  HCT 31.1* 30.8* 31.9*  MCV 80.8 81.9 84.4  PLT 267 238 266   Cardiac Enzymes:  Recent Labs Lab 06/25/12 0612 06/25/12 1145 06/25/12 1818  TROPONINI <0.30 <0.30 <0.30    Signed:  Siyon Linck  [facility] 06/26/2012, 11:12 AM

## 2012-06-26 NOTE — Progress Notes (Signed)
DC orders received.  Patient stable with no S/S of distress.  Medication and discharge information reviewed with patient and patient's husband.  Patient DC home with husband. Julie [name]  

## 2012-07-03 ENCOUNTER — Encounter: Payer: Self-pay | Admitting: Family Medicine

## 2012-07-03 ENCOUNTER — Ambulatory Visit (INDEPENDENT_AMBULATORY_CARE_PROVIDER_SITE_OTHER): Payer: BC Managed Care – PPO | Admitting: Family Medicine

## 2012-07-03 VITALS — BP 165/75 | HR 67 | Temp 97.8°F

## 2012-07-03 DIAGNOSIS — R531 Weakness: Secondary | ICD-10-CM

## 2012-07-03 DIAGNOSIS — F411 Generalized anxiety disorder: Secondary | ICD-10-CM

## 2012-07-03 DIAGNOSIS — F419 Anxiety disorder, unspecified: Secondary | ICD-10-CM | POA: Insufficient documentation

## 2012-07-03 DIAGNOSIS — R5381 Other malaise: Secondary | ICD-10-CM

## 2012-07-03 DIAGNOSIS — E039 Hypothyroidism, unspecified: Secondary | ICD-10-CM

## 2012-07-03 DIAGNOSIS — E870 Hyperosmolality and hypernatremia: Secondary | ICD-10-CM

## 2012-07-03 DIAGNOSIS — I10 Essential (primary) hypertension: Secondary | ICD-10-CM

## 2012-07-03 LAB — POCT CBC
Granulocyte percent: 84.2 %G — AB (ref 37–80)
Lymph, poc: 1.1 (ref 0.6–3.4)
MCV: 86.3 fL (ref 80–97)
MPV: 6.2 fL (ref 0–99.8)
POC Granulocyte: 7.8 — AB (ref 2–6.9)
POC LYMPH PERCENT: 12.2 %L (ref 10–50)
Platelet Count, POC: 374 10*3/uL (ref 142–424)
RBC: 4.6 M/uL (ref 4.04–5.48)
RDW, POC: 13.4 %
WBC: 9.3 10*3/uL (ref 4.6–10.2)

## 2012-07-03 MED ORDER — LOSARTAN POTASSIUM-HCTZ 50-12.5 MG PO TABS: 1.0000 | ORAL_TABLET | Freq: Every day | ORAL | Status: DC

## 2012-07-03 MED ORDER — BUSPIRONE HCL 15 MG PO TABS: 15.0000 mg | ORAL_TABLET | Freq: Three times a day (TID) | ORAL | Status: DC

## 2012-07-03 MED ORDER — METOPROLOL SUCCINATE ER 25 MG PO TB24: 25.0000 mg | ORAL_TABLET | Freq: Two times a day (BID) | ORAL | Status: DC

## 2012-07-03 NOTE — Patient Instructions (Addendum)
Take meds as directed Going to restart Toprol 25 one in the morning and one half in the p.m., daily Discontinue lisinopril HCT Start losartan 50-25 one daily Increase BuSpar to 15 twice daily regularly, and an additional 15 if needed for anticipated anxiety Remain out of school today and tomorrow Rest fluids and avoid caffeine

## 2012-07-03 NOTE — Progress Notes (Addendum)
  Subjective:    Patient ID: Julie [name], female    DOB: [birth year], 57 y.o.   MRN: [id number]  HPI Patient was just discharged from the hospital after being there for evaluation of lightheadedness dizziness and elevated blood pressure.   Review of Systems  Respiratory: Negative for shortness of breath.   Cardiovascular: Positive for palpitations. Negative for chest pain.  Neurological: Positive for light-headedness.  Psychiatric/Behavioral: The patient is nervous/anxious.    Hospital note reviewed. EKG reviewed.    Objective:   Physical Exam BP 165/75  Pulse 67  Temp(Src) 97.8 F (36.6 C) (Oral)  The patient appeared well nourished and normally developed, alert and oriented to time and place. Speech, behavior and judgement appear normal. Vital signs as documented.  Head exam is unremarkable. No scleral icterus or pallor noted.  Neck is without jugular venous distension, thyromegally, or carotid bruits. Carotid upstrokes are brisk bilaterally. No cervical adenopathy. Lungs are clear anteriorly and posteriorly to auscultation. Normal respiratory effort. Cardiac exam reveals regular rate and rhythm at 72 per minute. First and second heart sounds normal.  No murmurs, rubs or gallops.  Abdominal exam reveals normal bowl sounds, no masses, no organomegaly and no aortic enlargement. No inguinal adenopathy. Extremities are nonedematous and both femoral and pedal pulses are normal. Skin without pallor or jaundice.  Warm and dry, without rash. Neurologic exam reveals normal deep tendon reflexes and normal sensation. Repeat blood pressure 140/84  EKG: {ekg findings:"normal EKG, normal sinus rhythm",.        Assessment & Plan:  hypertension - Plan: ED EKG, ED EKG  Weakness - Plan: POCT CBC  Hypothyroidism  Hypernatremia  Anxiety  Patient Instructions  Take meds as directed Going to restart Toprol 25 one in the morning and one half in the p.m., daily Discontinue  lisinopril HCT Start losartan 50-25 one daily Increase BuSpar to 15 twice daily regularly, and an additional 15 if needed for anticipated anxiety Remain out of school today and tomorrow Rest fluids and avoid caffeine   we will call you with the results all labs drawn today Recheck in 2 week

## 2012-07-03 NOTE — Addendum Note (Signed)
Addended by: Bearl Mulberry on: 07/03/2012 12:28 PM   Modules accepted: Orders

## 2012-07-17 ENCOUNTER — Ambulatory Visit (INDEPENDENT_AMBULATORY_CARE_PROVIDER_SITE_OTHER): Payer: BC Managed Care – PPO | Admitting: Family Medicine

## 2012-07-17 ENCOUNTER — Encounter: Payer: Self-pay | Admitting: Family Medicine

## 2012-07-17 VITALS — BP 127/66 | HR 67 | Temp 97.0°F | Ht 63.0 in | Wt 149.6 lb

## 2012-07-17 DIAGNOSIS — I10 Essential (primary) hypertension: Secondary | ICD-10-CM

## 2012-07-17 DIAGNOSIS — F411 Generalized anxiety disorder: Secondary | ICD-10-CM

## 2012-07-17 DIAGNOSIS — F419 Anxiety disorder, unspecified: Secondary | ICD-10-CM

## 2012-07-17 NOTE — Progress Notes (Signed)
  Subjective:    Patient ID: Julie [name], female    DOB: August 06, 1955, 57 y.o.   MRN: [id number]  HPI Patient returns to clinic today for followup of hypertension and anxiety. She has been seeing a [facility]. He is a Warden/ranger. He started her on Zoloft and clonazepam. This has had a huge impact on helping her be more calm. She is still taking her regular medications and she is gradually increasing the dose of Zoloft and will eventually take clonazepam only as needed.   Review of Systems  Cardiovascular: Negative for chest pain and palpitations.  Neurological: Positive for dizziness (when increasing Buspar) and headaches (when increasing Buspar, has now resolved).       Objective:   Physical Exam  Vitals reviewed. Constitutional: She is oriented to person, place, and time. She appears well-developed and well-nourished. No distress.  HENT:  Head: Normocephalic and atraumatic.  Eyes: Conjunctivae are normal. Right eye exhibits no discharge. Left eye exhibits no discharge. No scleral icterus.  Neck: Normal range of motion. Neck supple. No thyromegaly present.  Cardiovascular: Normal rate and regular rhythm.  Exam reveals no gallop.   No murmur heard. Pulmonary/Chest: Effort normal and breath sounds normal. No respiratory distress. She has no wheezes. She has no rales.  Abdominal: Soft. There is no tenderness.  Musculoskeletal: Normal range of motion.  Lymphadenopathy:    She has no cervical adenopathy.  Neurological: She is alert and oriented to person, place, and time. She has normal reflexes.  Manner is much calmer and definitely improved  Skin: Skin is warm and dry.  Psychiatric: She has a normal mood and affect. Her behavior is normal. Judgment and thought content normal.          Assessment & Plan:  Essential hypertension, benign  Anxiety  Patient Instructions  Continue current medications.  Call sooner if any problems   Continue  followup with Dr. Adriana Simas and the medications that he has started.

## 2012-07-17 NOTE — Patient Instructions (Signed)
Continue current medications.  Call sooner if any problems

## 2012-09-02 DIAGNOSIS — H2513 Age-related nuclear cataract, bilateral: Secondary | ICD-10-CM | POA: Insufficient documentation

## 2012-09-02 DIAGNOSIS — M3501 Sicca syndrome with keratoconjunctivitis: Secondary | ICD-10-CM | POA: Insufficient documentation

## 2012-09-02 DIAGNOSIS — L718 Other rosacea: Secondary | ICD-10-CM | POA: Insufficient documentation

## 2012-09-02 DIAGNOSIS — H16223 Keratoconjunctivitis sicca, not specified as Sjogren's, bilateral: Secondary | ICD-10-CM | POA: Insufficient documentation

## 2012-09-02 DIAGNOSIS — E349 Endocrine disorder, unspecified: Secondary | ICD-10-CM | POA: Insufficient documentation

## 2012-09-02 DIAGNOSIS — H02889 Meibomian gland dysfunction of unspecified eye, unspecified eyelid: Secondary | ICD-10-CM | POA: Insufficient documentation

## 2012-09-02 DIAGNOSIS — H04129 Dry eye syndrome of unspecified lacrimal gland: Secondary | ICD-10-CM | POA: Insufficient documentation

## 2012-09-26 ENCOUNTER — Other Ambulatory Visit: Payer: Self-pay | Admitting: Family Medicine

## 2012-10-22 ENCOUNTER — Other Ambulatory Visit: Payer: Self-pay | Admitting: Family Medicine

## 2012-10-22 DIAGNOSIS — N644 Mastodynia: Secondary | ICD-10-CM

## 2012-10-31 ENCOUNTER — Ambulatory Visit
Admission: RE | Admit: 2012-10-31 | Discharge: 2012-10-31 | Disposition: A | Discharge status: Home / Self Care | Payer: BC Managed Care – PPO | Source: Ambulatory Visit | Attending: Family Medicine | Admitting: Family Medicine

## 2012-10-31 DIAGNOSIS — N644 Mastodynia: Secondary | ICD-10-CM

## 2012-11-19 ENCOUNTER — Encounter: Payer: Self-pay | Admitting: Family Medicine

## 2012-11-19 ENCOUNTER — Ambulatory Visit (INDEPENDENT_AMBULATORY_CARE_PROVIDER_SITE_OTHER): Payer: BC Managed Care – PPO | Admitting: Family Medicine

## 2012-11-19 ENCOUNTER — Ambulatory Visit (INDEPENDENT_AMBULATORY_CARE_PROVIDER_SITE_OTHER): Payer: BC Managed Care – PPO

## 2012-11-19 VITALS — BP 131/81 | HR 72 | Temp 98.0°F | Ht 63.0 in | Wt 156.0 lb

## 2012-11-19 DIAGNOSIS — F411 Generalized anxiety disorder: Secondary | ICD-10-CM

## 2012-11-19 DIAGNOSIS — M25562 Pain in left knee: Secondary | ICD-10-CM

## 2012-11-19 DIAGNOSIS — M25569 Pain in unspecified knee: Secondary | ICD-10-CM

## 2012-11-19 DIAGNOSIS — E039 Hypothyroidism, unspecified: Secondary | ICD-10-CM

## 2012-11-19 DIAGNOSIS — R002 Palpitations: Secondary | ICD-10-CM

## 2012-11-19 DIAGNOSIS — I10 Essential (primary) hypertension: Secondary | ICD-10-CM

## 2012-11-19 DIAGNOSIS — E559 Vitamin D deficiency, unspecified: Secondary | ICD-10-CM

## 2012-11-19 DIAGNOSIS — E079 Disorder of thyroid, unspecified: Secondary | ICD-10-CM

## 2012-11-19 DIAGNOSIS — F419 Anxiety disorder, unspecified: Secondary | ICD-10-CM

## 2012-11-19 LAB — POCT CBC
HCT, POC: 37.9 % (ref 37.7–47.9)
Hemoglobin: 12.8 g/dL (ref 12.2–16.2)
MCH, POC: 28.5 pg (ref 27–31.2)
MCV: 84.5 fL (ref 80–97)
RBC: 4.5 M/uL (ref 4.04–5.48)

## 2012-11-19 NOTE — Patient Instructions (Addendum)
Continue current medications. Continue good therapeutic lifestyle changes.  Fall precautions discussed with patient. Schedule your flu vaccine the first of October. Follow up as planned and earlier as needed.   

## 2012-11-19 NOTE — Progress Notes (Addendum)
  Subjective:    Patient ID: Julie [name], female    DOB: [birth year], 57 y.o.   MRN: [id number]  HPI The patient returns to clinic today for followup and management of chronic medical problems. These problems include hypertension, hypothyroidism, hyperlipidemia, fibromyalgia and anxiety. She also review of systems.: Her health maintenance parameters and she may be in need of a Pap smear, the flu shot and a fecal occult blood test.   Review of Systems  Constitutional: Negative.   HENT: Negative.   Eyes: Positive for pain (see Dr Everardo All in [facility])) and redness.  Respiratory: Negative.   Cardiovascular: Negative.   Gastrointestinal: Negative.   Endocrine: Negative.   Genitourinary: Negative.   Musculoskeletal: Positive for myalgias.  Skin: Positive for rash (tick bite in the summer).  Allergic/Immunologic: Negative.   Neurological: Negative.   Hematological: Negative.   Psychiatric/Behavioral: Negative.        Objective:   Physical Exam BP 131/81  Pulse 72  Temp(Src) 98 F (36.7 C) (Oral)  Ht 5\' 3"  (1.6 m)  Wt 156 lb (70.761 kg)  BMI 27.64 kg/m2  The patient appeared well nourished and normally developed, alert and oriented to time and place. Speech, behavior and judgement appear normal. Vital signs as documented.  Head exam is unremarkable. No scleral icterus or pallor noted. His nose and throat were normal.  Neck is without jugular venous distension, thyromegally, or carotid bruits. Carotid upstrokes are brisk bilaterally. No cervical adenopathy. Lungs are clear anteriorly and posteriorly to auscultation. Normal respiratory effort. Cardiac exam reveals regular rate and rhythm at 72 per minute. First and second heart sounds normal.  No murmurs, rubs or gallops.  Abdominal exam reveals normal bowl sounds, no masses, no organomegaly and no aortic enlargement. No inguinal adenopathy. There is no abdominal tenderness. Extremities are nonedematous and both  femoral and pedal pulses are normal. Skin without pallor or jaundice.  Warm and dry, without rash. Area of tick bite appears to be healing Neurologic exam reveals normal deep tendon reflexes and normal sensation.  [facility] reading (PRIMARY) by  Dr. Christell Constant: Left knee  normal                                       Assessment & Plan:  1. Hypertension - Hepatic function panel - BMP8+EGFR - NMR, lipoprofile - POCT CBC  2. Thyroid disease - Thyroid Panel With TSH - POCT CBC  3. Palpitations - Vit D  25 hydroxy (rtn osteoporosis monitoring) - POCT CBC  4. Hypothyroidism - Thyroid Panel With TSH - POCT CBC  5. Anxiety - POCT CBC  6. Vitamin D deficiency - Vit D  25 hydroxy (rtn osteoporosis monitoring)  7. Left knee pain -X-ray  Patient Instructions  Continue current medications. Continue good therapeutic lifestyle changes.  Fall precautions discussed with patient.  Schedule your flu vaccine the first of October.  Follow up as planned and earlier as needed.   Try to exercise and drink more water Take Advil one twice daily after breakfast and supper for the next 7-10 day Use warm wet compresses to area of knee that is sore Nyra Capes MD

## 2012-11-21 LAB — BMP8+EGFR
BUN/Creatinine Ratio: 17 (ref 9–23)
BUN: 15 mg/dL (ref 6–24)
CO2: 29 mmol/L (ref 18–29)
Calcium: 9.7 mg/dL (ref 8.7–10.2)
Chloride: 97 mmol/L (ref 97–108)
Creatinine, Ser: 0.9 mg/dL (ref 0.57–1.00)
GFR calc Af Amer: 81 mL/min/{1.73_m2} (ref >59)
GFR calc non Af Amer: 70 mL/min/{1.73_m2} (ref >59)
Glucose: 87 mg/dL (ref 65–99)
Potassium: 4.2 mmol/L (ref 3.5–5.2)
Sodium: 140 mmol/L (ref 134–144)

## 2012-11-21 LAB — THYROID PANEL WITH TSH
Free Thyroxine Index: 2.5 (ref 1.2–4.9)
TSH: 4.67 u[IU]/mL — ABNORMAL HIGH (ref 0.450–4.500)

## 2012-11-21 LAB — NMR, LIPOPROFILE
Cholesterol: 180 mg/dL (ref <200)
LDL Size: 20.3 nm — ABNORMAL LOW (ref >20.5)
LP-IR Score: 74 — ABNORMAL HIGH (ref <=45)
Small LDL Particle Number: 1019 nmol/L — ABNORMAL HIGH (ref <=527)
Triglycerides by NMR: 130 mg/dL (ref <150)

## 2012-11-21 LAB — HEPATIC FUNCTION PANEL
ALT: 14 IU/L (ref 0–32)
Alkaline Phosphatase: 72 IU/L (ref 39–117)
Bilirubin, Direct: 0.07 mg/dL (ref 0.00–0.40)
Total Bilirubin: 0.2 mg/dL (ref 0.0–1.2)
Total Protein: 6.6 g/dL (ref 6.0–8.5)

## 2012-11-27 ENCOUNTER — Telehealth: Payer: Self-pay | Admitting: Family Medicine

## 2012-11-27 ENCOUNTER — Other Ambulatory Visit: Payer: Self-pay | Admitting: *Deleted

## 2012-11-27 DIAGNOSIS — E785 Hyperlipidemia, unspecified: Secondary | ICD-10-CM

## 2012-11-27 DIAGNOSIS — E039 Hypothyroidism, unspecified: Secondary | ICD-10-CM

## 2012-11-27 MED ORDER — ATORVASTATIN CALCIUM 20 MG PO TABS: 20.0000 mg | ORAL_TABLET | Freq: Every day | ORAL | Status: DC

## 2012-11-27 NOTE — Telephone Encounter (Signed)
PT CALLED TODAY

## 2012-12-19 ENCOUNTER — Other Ambulatory Visit: Payer: Self-pay | Admitting: Family Medicine

## 2012-12-29 ENCOUNTER — Other Ambulatory Visit (INDEPENDENT_AMBULATORY_CARE_PROVIDER_SITE_OTHER): Payer: BC Managed Care – PPO

## 2012-12-29 DIAGNOSIS — E785 Hyperlipidemia, unspecified: Secondary | ICD-10-CM

## 2012-12-29 DIAGNOSIS — E039 Hypothyroidism, unspecified: Secondary | ICD-10-CM

## 2012-12-29 NOTE — Progress Notes (Signed)
Patient came in for labs only.

## 2012-12-30 LAB — HEPATIC FUNCTION PANEL
AST: 20 IU/L (ref 0–40)
Albumin: 4.2 g/dL (ref 3.5–5.5)
Alkaline Phosphatase: 66 IU/L (ref 39–117)
Bilirubin, Direct: 0.09 mg/dL (ref 0.00–0.40)
Total Bilirubin: 0.4 mg/dL (ref 0.0–1.2)

## 2013-01-06 ENCOUNTER — Telehealth: Payer: Self-pay | Admitting: Family Medicine

## 2013-01-07 NOTE — Telephone Encounter (Signed)
Called pt.

## 2013-01-08 ENCOUNTER — Other Ambulatory Visit: Payer: Self-pay

## 2013-01-27 ENCOUNTER — Other Ambulatory Visit: Payer: Self-pay | Admitting: Family Medicine

## 2013-02-09 ENCOUNTER — Other Ambulatory Visit: Payer: Self-pay | Admitting: Family Medicine

## 2013-03-26 ENCOUNTER — Other Ambulatory Visit: Payer: Self-pay | Admitting: Family Medicine

## 2013-04-26 ENCOUNTER — Other Ambulatory Visit: Payer: Self-pay | Admitting: Family Medicine

## 2013-05-08 ENCOUNTER — Telehealth: Payer: Self-pay | Admitting: Family Medicine

## 2013-05-08 NOTE — Telephone Encounter (Signed)
Please increase thyroid medication to 100 mcg one daily repeat thyroid profile in 6 weeks Call prescription in for #30 with as needed refill Order thyroid profile in 6 weeks

## 2013-05-08 NOTE — Telephone Encounter (Signed)
Called in.

## 2013-05-08 NOTE — Telephone Encounter (Signed)
Talked with patient she is taking 38mcg of synthroid every morning.

## 2013-05-11 ENCOUNTER — Other Ambulatory Visit: Payer: Self-pay | Admitting: *Deleted

## 2013-05-11 DIAGNOSIS — E039 Hypothyroidism, unspecified: Secondary | ICD-10-CM

## 2013-05-11 MED ORDER — LEVOTHYROXINE SODIUM 100 MCG PO TABS: 100.0000 ug | ORAL_TABLET | Freq: Every day | ORAL | Status: DC

## 2013-06-04 ENCOUNTER — Other Ambulatory Visit: Payer: Self-pay | Admitting: Family Medicine

## 2013-06-11 ENCOUNTER — Other Ambulatory Visit: Payer: Self-pay | Admitting: Family Medicine

## 2013-07-07 ENCOUNTER — Other Ambulatory Visit: Payer: BC Managed Care – PPO

## 2013-07-07 ENCOUNTER — Ambulatory Visit (INDEPENDENT_AMBULATORY_CARE_PROVIDER_SITE_OTHER): Payer: BC Managed Care – PPO | Admitting: Family Medicine

## 2013-07-07 ENCOUNTER — Encounter: Payer: Self-pay | Admitting: Family Medicine

## 2013-07-07 VITALS — BP 140/80 | HR 80 | Temp 98.0°F | Ht 63.0 in | Wt 166.0 lb

## 2013-07-07 DIAGNOSIS — E039 Hypothyroidism, unspecified: Secondary | ICD-10-CM

## 2013-07-07 DIAGNOSIS — J209 Acute bronchitis, unspecified: Secondary | ICD-10-CM

## 2013-07-07 MED ORDER — METHYLPREDNISOLONE (PAK) 4 MG PO TABS: ORAL_TABLET | ORAL | Status: DC

## 2013-07-07 MED ORDER — AZITHROMYCIN 250 MG PO TABS: ORAL_TABLET | ORAL | Status: DC

## 2013-07-07 NOTE — Progress Notes (Signed)
Pt came in for labs only 

## 2013-07-08 LAB — THYROID PANEL WITH TSH
FREE THYROXINE INDEX: 2.3 (ref 1.2–4.9)
T3 Uptake Ratio: 29 % (ref 24–39)
T4 TOTAL: 8 ug/dL (ref 4.5–12.0)
TSH: 1.19 u[IU]/mL (ref 0.450–4.500)

## 2013-07-09 NOTE — Progress Notes (Signed)
   Subjective:    Patient ID: Julie [name], female    DOB: 02/03/1956, 58 y.o.   MRN: [id number]  HPI  This 58 y.o. female presents for evaluation of URI sx's.  Review of Systems No chest pain, SOB, HA, dizziness, vision change, N/V, diarrhea, constipation, dysuria, urinary urgency or frequency, myalgias, arthralgias or rash.     Objective:   Physical Exam Vital signs noted  Well developed well nourished female.  HEENT - Head atraumatic Normocephalic                Eyes - PERRLA, Conjuctiva - clear Sclera- Clear EOMI                Ears - EAC's Wnl TM's Wnl Gross Hearing WNL                Throat - oropharanx wnl Respiratory - Lungs CTA bilateral Cardiac - RRR S1 and S2 without murmur GI - Abdomen soft Nontender and bowel sounds active x 4 Extremities - No edema. Neuro - Grossly intact.       Assessment & Plan:  Acute bronchitis - Plan: methylPREDNIsolone (MEDROL DOSPACK) 4 MG tablet, azithromycin (ZITHROMAX) 250 MG tablet Push po fluids, rest, tylenol and motrin otc prn as directed for fever, arthralgias, and myalgias.  Follow up prn if sx's continue or persist.  Lysbeth Penner FNP

## 2013-07-20 ENCOUNTER — Other Ambulatory Visit: Payer: Self-pay | Admitting: Family Medicine

## 2013-07-23 ENCOUNTER — Ambulatory Visit (INDEPENDENT_AMBULATORY_CARE_PROVIDER_SITE_OTHER): Payer: BC Managed Care – PPO | Admitting: Family Medicine

## 2013-07-23 ENCOUNTER — Encounter: Payer: Self-pay | Admitting: Family Medicine

## 2013-07-23 VITALS — BP 130/80 | HR 63 | Temp 97.3°F | Ht 63.0 in | Wt 167.0 lb

## 2013-07-23 DIAGNOSIS — M797 Fibromyalgia: Secondary | ICD-10-CM

## 2013-07-23 DIAGNOSIS — F411 Generalized anxiety disorder: Secondary | ICD-10-CM

## 2013-07-23 DIAGNOSIS — F419 Anxiety disorder, unspecified: Secondary | ICD-10-CM

## 2013-07-23 DIAGNOSIS — I10 Essential (primary) hypertension: Secondary | ICD-10-CM

## 2013-07-23 DIAGNOSIS — E039 Hypothyroidism, unspecified: Secondary | ICD-10-CM

## 2013-07-23 DIAGNOSIS — IMO0001 Reserved for inherently not codable concepts without codable children: Secondary | ICD-10-CM

## 2013-07-23 MED ORDER — NITROGLYCERIN 0.4 MG SL SUBL: 0.4000 mg | SUBLINGUAL_TABLET | SUBLINGUAL | Status: DC | PRN

## 2013-07-23 MED ORDER — METOPROLOL SUCCINATE ER 25 MG PO TB24: ORAL_TABLET | ORAL | Status: DC

## 2013-07-23 MED ORDER — LOSARTAN POTASSIUM-HCTZ 50-12.5 MG PO TABS: 1.0000 | ORAL_TABLET | Freq: Every day | ORAL | Status: DC

## 2013-07-23 MED ORDER — PANTOPRAZOLE SODIUM 40 MG PO TBEC
DELAYED_RELEASE_TABLET | ORAL | Status: DC
Start: 2013-07-23 — End: 2014-07-20

## 2013-07-23 MED ORDER — ATORVASTATIN CALCIUM 20 MG PO TABS: ORAL_TABLET | ORAL | Status: DC

## 2013-07-23 MED ORDER — SYNTHROID 100 MCG PO TABS: ORAL_TABLET | ORAL | Status: DC

## 2013-07-23 MED ORDER — CLONAZEPAM 0.5 MG PO TABS: 0.5000 mg | ORAL_TABLET | Freq: Three times a day (TID) | ORAL | Status: DC | PRN

## 2013-07-23 NOTE — Progress Notes (Signed)
Subjective:    Patient ID: Julie [name], female    DOB: 12/20/1955, 58 y.o.   MRN: [id number]  HPI Pt here for follow up and management of chronic medical problems. The patient does complain of a recent respiratory infection. It is important to note that recent thyroid tests were within normal limits. The patient had all of her other lab work done at school. She will bring it in to scan into the record. Her home blood pressures have been running in the 120s over the 70s.         Patient Active Problem List   Diagnosis Date Noted  . Anxiety 07/03/2012  . Hypothyroidism 06/25/2012  . Anemia 11/03/2011  . Hypernatremia 11/03/2011  . Hyperchloremia 11/03/2011  . Palpitations 11/02/2011  . Chest pain 11/02/2011  . Hypertension   . Fibromyalgia   . Thyroid disease    Outpatient Encounter Prescriptions as of 07/23/2013  Medication Sig  . aspirin 81 MG tablet Take 81 mg by mouth daily.  Marland Kitchen atorvastatin (LIPITOR) 20 MG tablet TAKE 1 TABLET (20 MG TOTAL) BY MOUTH DAILY.  Marland Kitchen BRINTELLIX 20 MG TABS Take 20 mg by mouth daily.   . clonazePAM (KLONOPIN) 0.5 MG tablet Take 0.5 mg by mouth 3 (three) times daily as needed.  . cycloSPORINE (RESTASIS) 0.05 % ophthalmic emulsion Place 1 drop into both eyes 2 (two) times daily.  Marland Kitchen losartan-hydrochlorothiazide (HYZAAR) 50-12.5 MG per tablet TAKE 1 TABLET BY MOUTH DAILY.  . metoprolol succinate (TOPROL-XL) 25 MG 24 hr tablet TAKE 1 TABLET (25 MG TOTAL) BY MOUTH three TIMES DAILY.  . nitroGLYCERIN (NITROSTAT) 0.4 MG SL tablet Place 1 tablet (0.4 mg total) under the tongue every 5 (five) minutes as needed for chest pain.  . pantoprazole (PROTONIX) 40 MG tablet TAKE 1 TABLET EVERY DAY  . Polyethyl Glycol-Propyl Glycol (SYSTANE ULTRA OP) Apply 1 drop to eye at bedtime.  Marland Kitchen SYNTHROID 100 MCG tablet TAKE 1 TABLET BY MOUTH ONCE DAILY  . [DISCONTINUED] metoprolol succinate (TOPROL-XL) 25 MG 24 hr tablet TAKE 1 TABLET (25 MG TOTAL) BY MOUTH 2 (TWO) TIMES  DAILY.  . [DISCONTINUED] azithromycin (ZITHROMAX) 250 MG tablet Take 2 po first day and then one po qd x 4 days  . [DISCONTINUED] citalopram (CELEXA) 20 MG tablet Take 20 mg by mouth daily.  . [DISCONTINUED] methylPREDNIsolone (MEDROL DOSPACK) 4 MG tablet follow package directions    Review of Systems  Constitutional: Negative.   HENT: Negative.   Eyes: Negative.   Respiratory: Negative.        Recent respiratory infection  Cardiovascular: Negative.   Gastrointestinal: Negative.   Endocrine: Negative.   Genitourinary: Negative.   Musculoskeletal: Negative.   Skin: Negative.   Allergic/Immunologic: Negative.   Neurological: Negative.   Hematological: Negative.   Psychiatric/Behavioral: Negative.        Objective:   Physical Exam  Nursing note and vitals reviewed. Constitutional: She is oriented to person, place, and time. She appears well-developed and well-nourished. No distress.  Patient is calm and pleasant  HENT:  Head: Normocephalic.  Right Ear: External ear normal.  Left Ear: External ear normal.  Nose: Nose normal.  Mouth/Throat: Oropharynx is clear and moist.  Eyes: Conjunctivae and EOM are normal. Pupils are equal, round, and reactive to light. Right eye exhibits no discharge. Left eye exhibits no discharge. No scleral icterus.  Neck: Normal range of motion. Neck supple. No JVD present. No thyromegaly present.  No carotid bruits  Cardiovascular: Normal rate, regular  rhythm, normal heart sounds and intact distal pulses.   No murmur heard. 72 per minute  Pulmonary/Chest: Effort normal and breath sounds normal. No respiratory distress. She has no wheezes. She has no rales.  She currently has a dry cough with minimal congestion  Abdominal: Soft. Bowel sounds are normal. She exhibits no mass. There is no tenderness. There is no rebound and no guarding.  Musculoskeletal: Normal range of motion. She exhibits no edema and no tenderness.  Lymphadenopathy:    She has no  cervical adenopathy.  Neurological: She is alert and oriented to person, place, and time. She has normal reflexes. No cranial nerve deficit.  Skin: Skin is warm and dry. No rash noted.  Psychiatric: She has a normal mood and affect. Her behavior is normal. Judgment and thought content normal.   BP 142/81  Pulse 63  Temp(Src) 97.3 F (36.3 C) (Oral)  Ht 5\' 3"  (1.6 m)  Wt 167 lb (75.751 kg)  BMI 29.59 kg/m2        Assessment & Plan:  1. Hypertension -Blood pressure good control, continue to monitor at home   2. Hypothyroidism -Continue current treatment for thyroid  3. Fibromyalgia -Stable  4. Anxiety -Continue current treatments  Patient Instructions  Continue current medications. Continue good therapeutic lifestyle changes which include good diet and exercise. Fall precautions discussed with patient. If an FOBT was given today- please return it to our front desk. If you are over 87 years old - you may need Prevnar 3 or the adult Pneumonia vaccine. Check with your insurance about the shingles shot- Zostavax.   Continue to monitor blood pressures at home Return  the FOBT Also, please check with her insurance regarding the Prevnar vaccine. Remember to get your pelvic and Pap smear in the summer. Continue to use plain Mucinex twice daily with a large glass of water for cough and congestion for the next 4 weeks. Protect your airways from environmental irritants   Arrie Senate MD

## 2013-07-23 NOTE — Patient Instructions (Addendum)
Continue current medications. Continue good therapeutic lifestyle changes which include good diet and exercise. Fall precautions discussed with patient. If an FOBT was given today- please return it to our front desk. If you are over 60 years old - you may need Prevnar 56 or the adult Pneumonia vaccine. Check with your insurance about the shingles shot- Zostavax.   Continue to monitor blood pressures at home Return  the FOBT Also, please check with her insurance regarding the Prevnar vaccine. Remember to get your pelvic and Pap smear in the summer. Continue to use plain Mucinex twice daily with a large glass of water for cough and congestion for the next 4 weeks. Protect your airways from environmental irritants

## 2013-07-23 NOTE — Addendum Note (Signed)
Addended by: Zannie Cove on: 07/23/2013 11:32 AM   Modules accepted: Orders

## 2013-08-16 ENCOUNTER — Other Ambulatory Visit: Payer: Self-pay | Admitting: Family Medicine

## 2013-09-09 ENCOUNTER — Encounter: Payer: Self-pay | Admitting: Family

## 2013-09-09 ENCOUNTER — Ambulatory Visit (INDEPENDENT_AMBULATORY_CARE_PROVIDER_SITE_OTHER): Payer: BC Managed Care – PPO | Admitting: Family

## 2013-09-09 VITALS — BP 129/77 | HR 68 | Temp 97.0°F | Ht 63.0 in | Wt 166.0 lb

## 2013-09-09 DIAGNOSIS — B001 Herpesviral vesicular dermatitis: Secondary | ICD-10-CM

## 2013-09-09 DIAGNOSIS — Z01419 Encounter for gynecological examination (general) (routine) without abnormal findings: Secondary | ICD-10-CM

## 2013-09-09 MED ORDER — VALACYCLOVIR HCL 1 G PO TABS: 2000.0000 mg | ORAL_TABLET | Freq: Two times a day (BID) | ORAL | Status: DC

## 2013-09-09 NOTE — Patient Instructions (Addendum)
Health Maintenance, Female A healthy lifestyle and preventative care can promote health and wellness.  Maintain regular health, dental, and eye exams.  Eat a healthy diet. Foods like vegetables, fruits, whole grains, low-fat dairy products, and lean protein foods contain the nutrients you need without too many calories. Decrease your intake of foods high in solid fats, added sugars, and salt. Get information about a proper diet from your caregiver, if necessary.  Regular physical exercise is one of the most important things you can do for your health. Most adults should get at least 150 minutes of moderate-intensity exercise (any activity that increases your heart rate and causes you to sweat) each week. In addition, most adults need muscle-strengthening exercises on 2 or more days a week.   Maintain a healthy weight. The body mass index (BMI) is a screening tool to identify possible weight problems. It provides an estimate of body fat based on height and weight. Your caregiver can help determine your BMI, and can help you achieve or maintain a healthy weight. For adults 20 years and older:  A BMI below 18.5 is considered underweight.  A BMI of 18.5 to 24.9 is normal.  A BMI of 25 to 29.9 is considered overweight.  A BMI of 30 and above is considered obese.  Maintain normal blood lipids and cholesterol by exercising and minimizing your intake of saturated fat. Eat a balanced diet with plenty of fruits and vegetables. Blood tests for lipids and cholesterol should begin at age 41 and be repeated every 5 years. If your lipid or cholesterol levels are high, you are over 50, or you are a high risk for heart disease, you may need your cholesterol levels checked more frequently.Ongoing high lipid and cholesterol levels should be treated with medicines if diet and exercise are not effective.  If you smoke, find out from your caregiver how to quit. If you do not use tobacco, do not start.  Lung  cancer screening is recommended for adults aged 66-80 years who are at high risk for developing lung cancer because of a history of smoking. Yearly low-dose computed tomography (CT) is recommended for people who have at least a 30-pack-year history of smoking and are a current smoker or have quit within the past 15 years. A pack year of smoking is smoking an average of 1 pack of cigarettes a day for 1 year (for example: 1 pack a day for 30 years or 2 packs a day for 15 years). Yearly screening should continue until the smoker has stopped smoking for at least 15 years. Yearly screening should also be stopped for people who develop a health problem that would prevent them from having lung cancer treatment.  If you are pregnant, do not drink alcohol. If you are breastfeeding, be very cautious about drinking alcohol. If you are not pregnant and choose to drink alcohol, do not exceed 1 drink per day. One drink is considered to be 12 ounces (355 mL) of beer, 5 ounces (148 mL) of wine, or 1.5 ounces (44 mL) of liquor.  Avoid use of street drugs. Do not share needles with anyone. Ask for help if you need support or instructions about stopping the use of drugs.  High blood pressure causes heart disease and increases the risk of stroke. Blood pressure should be checked at least every 1 to 2 years. Ongoing high blood pressure should be treated with medicines, if weight loss and exercise are not effective.  If you are 55 to 60  years old, ask your caregiver if you should take aspirin to prevent strokes.  Diabetes screening involves taking a blood sample to check your fasting blood sugar level. This should be done once every 3 years, after age 45, if you are within normal weight and without risk factors for diabetes. Testing should be considered at a younger age or be carried out more frequently if you are overweight and have at least 1 risk factor for diabetes.  Breast cancer screening is essential preventative care  for women. You should practice "breast self-awareness." This means understanding the normal appearance and feel of your breasts and may include breast self-examination. Any changes detected, no matter how small, should be reported to a caregiver. Women in their 20s and 30s should have a clinical breast exam (CBE) by a caregiver as part of a regular health exam every 1 to 3 years. After age 40, women should have a CBE every year. Starting at age 40, women should consider having a mammogram (breast X-ray) every year. Women who have a family history of breast cancer should talk to their caregiver about genetic screening. Women at a high risk of breast cancer should talk to their caregiver about having an MRI and a mammogram every year.  Breast cancer gene (BRCA)-related cancer risk assessment is recommended for women who have family members with BRCA-related cancers. BRCA-related cancers include breast, ovarian, tubal, and peritoneal cancers. Having family members with these cancers may be associated with an increased risk for harmful changes (mutations) in the breast cancer genes BRCA1 and BRCA2. Results of the assessment will determine the need for genetic counseling and BRCA1 and BRCA2 testing.  The Pap test is a screening test for cervical cancer. Women should have a Pap test starting at age 21. Between ages 21 and 29, Pap tests should be repeated every 2 years. Beginning at age 30, you should have a Pap test every 3 years as long as the past 3 Pap tests have been normal. If you had a hysterectomy for a problem that was not cancer or a condition that could lead to cancer, then you no longer need Pap tests. If you are between ages 65 and 70, and you have had normal Pap tests going back 10 years, you no longer need Pap tests. If you have had past treatment for cervical cancer or a condition that could lead to cancer, you need Pap tests and screening for cancer for at least 20 years after your treatment. If Pap  tests have been discontinued, risk factors (such as a new sexual partner) need to be reassessed to determine if screening should be resumed. Some women have medical problems that increase the chance of getting cervical cancer. In these cases, your caregiver may recommend more frequent screening and Pap tests.  The human papillomavirus (HPV) test is an additional test that may be used for cervical cancer screening. The HPV test looks for the virus that can cause the cell changes on the cervix. The cells collected during the Pap test can be tested for HPV. The HPV test could be used to screen women aged 30 years and older, and should be used in women of any age who have unclear Pap test results. After the age of 30, women should have HPV testing at the same frequency as a Pap test.  Colorectal cancer can be detected and often prevented. Most routine colorectal cancer screening begins at the age of 50 and continues through age 75. However, your caregiver may   recommend screening at an earlier age if you have risk factors for colon cancer. On a yearly basis, your caregiver may provide home test kits to check for hidden blood in the stool. Use of a small camera at the end of a tube, to directly examine the colon (sigmoidoscopy or colonoscopy), can detect the earliest forms of colorectal cancer. Talk to your caregiver about this at age 89, when routine screening begins. Direct examination of the colon should be repeated every 5 to 10 years through age 62, unless early forms of pre-cancerous polyps or small growths are found.  Hepatitis C blood testing is recommended for all people born from [birth year] through 1967 and any individual with known risks for hepatitis C.  Practice safe sex. Use condoms and avoid high-risk sexual practices to reduce the spread of sexually transmitted infections (STIs). Sexually active women aged 38 and younger should be checked for Chlamydia, which is a common sexually transmitted infection.  Older women with new or multiple partners should also be tested for Chlamydia. Testing for other STIs is recommended if you are sexually active and at increased risk.  Osteoporosis is a disease in which the bones lose minerals and strength with aging. This can result in serious bone fractures. The risk of osteoporosis can be identified using a bone density scan. Women ages 53 and over and women at risk for fractures or osteoporosis should discuss screening with their caregivers. Ask your caregiver whether you should be taking a calcium supplement or vitamin D to reduce the rate of osteoporosis.  Menopause can be associated with physical symptoms and risks. Hormone replacement therapy is available to decrease symptoms and risks. You should talk to your caregiver about whether hormone replacement therapy is right for you.  Use sunscreen. Apply sunscreen liberally and repeatedly throughout the day. You should seek shade when your shadow is shorter than you. Protect yourself by wearing long sleeves, pants, a wide-brimmed hat, and sunglasses year round, whenever you are outdoors.  Notify your caregiver of new moles or changes in moles, especially if there is a change in shape or color. Also notify your caregiver if a mole is larger than the size of a pencil eraser.  Stay current with your immunizations. Document Released: 09/04/2010 Document Revised: 06/16/2012 Document Reviewed: 01/21/2013 [facility] Patient Information 2015 [place], [place]. This information is not intended to replace advice given to you by your health care provider. Make sure you discuss any questions you have with your health care provider. Pelvic Exam A pelvic exam is an exam of a woman's outer and inner genitals and reproductive organs. The first pelvic exam should be at age 40. Pelvic exams allow your health care provider to check on normal development and screen for health problems. Usually, a general physical exam is done first. An  exam of the breasts is also done. At this visit, you can ask questions about your health, body, menstrual cycles, sex, and birth control methods. Your health care provider will also ask you questions about your health, family health, menstrual periods, immunizations, and if you are sexually active. The information shared between you and your health care provider is not shared with anyone else. WHAT ARE THE REASONS TO HAVE A PELVIC EXAM? One reason for a pelvic exam is to screen for cancer of the ovaries, uterus, and vagina (pelvic organs). Annual (once a year) pelvic exams to screen for cancer are no longer recommended for nonpregnant women who are considered low risk for cancer of the pelvic  organs and who do not have symptoms. These are sometimes called Pap tests. Ask your health care provider if a screening pelvic exam is right for you. For low-risk women, pelvic exam cancer screening should be done:  Every 3 years, for women ages 21-29.  Every 5 years, for women ages 88-65.  Some women have medical problems that increase the chance of getting cervical cancer. Talk to your health care provider about these problems. It is especially important to talk to your health care provider if a new problem develops soon after your last Pap test. In these cases, your health care provider may recommend more frequent screening and Pap tests.  The above recommendations are the same for women who have or have not gotten the vaccine for HPV, or human papillomavirus.  If you had a complete hysterectomy for a problem that was not cancer or a condition that could lead to cancer, then you no longer need Pap tests. However, even if you no longer need a Pap test, a regular exam is a good idea to make sure no other problems are starting.   If you have had past treatment for cervical cancer or a condition that could lead to cancer, you need annual Pap tests and screening for cancer for at least 20 years after your  treatment.  If you are no longer receiving Pap tests, risk factors (such as a new sexual partner)need to be discussed with your health care provider to determine if screening should be started again. Other reasons your health care provider might recommend a pelvic exam could include:   If you are at high risk for cervical cancer.  To make sure your female organs are normal and functioning correctly.  To check on body changes that suggest a reproductive system cancer.  To explore why you are not able to get pregnant (infertility).  To find a cause for vaginal discharge, itching, or burning.  To look for causes of why you cannot hold your urine (urinary incontinence).  To look for causes of sexual problems.  To look for signs of sexually transmitted infection (STI).  To follow the progression of labor. Your health care provider can check on the baby and how far your cervix has opened.  To determine if pregnancy is present or how far advanced the pregnancy is.  If you have:  severe cramping or pain during your menstrual periods.  pain during sexual intercourse.  abnormal menstrual periods.  no menstrual period by the age of 50. HOW IS A PELVIC EXAM PERFORMED?  A pelvic exam is usually painless but may cause mild discomfort. In unusual circumstances or in young girls, medicines may be used for comfort. A pelvic exam is not done routinely before a girl is sexually active. Special circumstances such as rape, trauma, or medical problems may require an exam. Below is what you can expect during a pelvic exam.  You will remove all your clothes and will be given a gown. Usually, there is a nurse in the room during the exam and you can have someone from your family with you also.  The general physical exam will be done first.  Before the pelvic exam starts, you will lie down on your back on a special table. You will put the heels of your feet into foot rests (stirrups) with your legs  apart. A gown, cloth, or paper drape is usually placed over your abdomen and legs.  First, the health care provider checks your outer genitals to  make sure the arrangement of body parts is normal. This includes the clitoris, vaginal opening, hymen, labia, and the perineal area between the vagina and rectum. The labia are the skin folds surrounding the vaginal opening. The tube that carries urine (urethra) is also examined.  An internal exam is done next. First, the health care provider inserts an instrument called a speculum into the vagina. The speculum has lubricant on it. The speculum helps hold the vaginal walls apart. The health care provider can then examine the vagina and cervix, which is the opening to the uterus. Cultures of any discharge may be taken to check for an infection. A Pap test may be done.  After the internal exam is done, the speculum is removed. Your health care provider will use latex gloves with a lubricant on his or her fingers to gently press against various pelvic organs from inside the vagina while his or her other hand is on your lower belly. Your health care provider will note any tenderness or abnormalities.  Following the exam, you will get dressed and can speak with your health care provider.  Ask your health care provider when and how often you should return for future visits. Finding Out the Results of Your Test Ask when your test results will be ready. Make sure you get your test results. HOW DO I CONTINUE A HEALTHY LIFESTYLE?  Follow your health care provider's advice regarding follow-up and future visits.  Get the necessary immunizations according to your age and any traveling you may do.  Eat a balanced, nourishing diet.  Get plenty of rest and sleep.  Exercise regularly.  Maintain a healthy weight.  Do not smoke or take illegal drugs.  Drink alcohol in moderation or not at all.  If you are sexually active, use some form of birth control if you  do not plan to get pregnant.  If you are sexually active, practice safe sex by using a condom to protect against sexually transmitted disease (STD).  Get help or counseling if you have emotional problems. Document Released: 05/12/2002 Document Revised: 02/24/2013 Document Reviewed: 05/18/2009 [facility] Patient Information 2015 [place], [place]. This information is not intended to replace advice given to you by your health care provider. Make sure you discuss any questions you have with your health care provider.  Cold Sore A cold sore (fever blister) is a skin infection caused by the herpes simplex virus (HSV-1). HSV-1 is closely related to the virus that causes gential herpes (HSV-2), but they are not the same even though both viruses can cause oral and genital infections. Cold sores are small, fluid-filled sores inside of the mouth or on the lips, gums, nose, chin, cheeks, or fingers.  The herpes simplex virus can be easily passed (contagious) to other people through close personal contact, such as kissing or sharing personal items. The virus can also spread to other parts of the body, such as the eyes or genitals. Cold sores are contagious until the sores crust over completely. They often heal within 2 weeks.  Once a person is infected, the herpes simplex virus remains permanently in the body. Therefore, there is no cure for cold sores, and they often recur when a person is tired, stressed, sick, or gets too much sun. Additional factors that can cause a recurrence include hormone changes in menstruation or pregnancy, certain drugs, and cold weather.  CAUSES  Cold sores are caused by the herpes simplex virus. The virus is spread from person to person through close  contact, such as through kissing, touching the affected area, or sharing personal items such as lip balm, razors, or eating utensils.  SYMPTOMS  The first infection may not cause symptoms. If symptoms develop, the symptoms often go through  different stages. Here is how a cold sore develops:   Tingling, itching, or burning is felt 1-2 days before the outbreak.   Fluid-filled blisters appear on the lips, inside the mouth, nose, or on the cheeks.   The blisters start to ooze clear fluid.   The blisters dry up and a yellow crust appears in its place.   The crust falls off.  Symptoms depend on whether it is the initial outbreak or a recurrence. Some other symptoms with the first outbreak may include:   Fever.   Sore throat.   Headache.   Muscle aches.   Swollen neck glands.  DIAGNOSIS  A diagnosis is often made based on your symptoms and looking at the sores. Sometimes, a sore may be swabbed and then examined in the lab to make a final diagnosis. If the sores are not present, blood tests can find the herpes simplex virus.  TREATMENT  There is no cure for cold sores and no vaccine for the herpes simplex virus. Within 2 weeks, most cold sores go away on their own without treatment. Medicines cannot make the infection go away, but medicine can help relieve some of the pain associated with the sores, can work to stop the virus from multiplying, and can also shorten healing time. Medicine may be in the form of creams, gels, pills, or a shot.  HOME CARE INSTRUCTIONS   Only take over-the-counter or prescription medicines for pain, discomfort, or fever as directed by your caregiver. Do not use aspirin.   Use a cotton-tip swab to apply creams or gels to your sores.   Do not touch the sores or pick the scabs. Wash your hands often. Do not touch your eyes without washing your hands first.   Avoid kissing, oral sex, and sharing personal items until sores heal.   Apply an ice pack on your sores for 10-15 minutes to ease any discomfort.   Avoid hot, cold, or salty foods because they may hurt your mouth. Eat a soft, bland diet to avoid irritating the sores. Use a straw to drink if you have pain when drinking out of a  glass.   Keep sores clean and dry to prevent an infection of other tissues.   Avoid the sun and limit stress if these things trigger outbreaks. If sun causes cold sores, apply sunscreen on the lips before being out in the sun.  SEEK MEDICAL CARE IF:   You have a fever or persistent symptoms for more than 2-3 days.   You have a fever and your symptoms suddenly get worse.   You have pus, not clear fluid, coming from the sores.   You have redness that is spreading.   You have pain or irritation in your eye.   You get sores on your genitals.   Your sores do not heal within 2 weeks.   You have a weakened immune system.   You have frequent recurrences of cold sores.  MAKE SURE YOU:   Understand these instructions.  Will watch your condition.  Will get help right away if you are not doing well or get worse. Document Released: 02/17/2000 Document Revised: 11/14/2011 Document Reviewed: 07/04/2011 [facility] Patient Information 2015 [place], [place]. This information is not intended to replace advice given  to you by your health care provider. Make sure you discuss any questions you have with your health care provider.

## 2013-09-09 NOTE — Progress Notes (Signed)
   Subjective:    Patient ID: Julie [name], female    DOB: 12-20-1955, 58 y.o.   MRN: [id number]  HPI Pt presents to the office for a pelvic exam only. Pt is normally followed by Dr. Laurance Flatten for her chronic conditions. Today, pt denies any pain, SOB, palpations, or edema.    Review of Systems  Constitutional: Negative.   HENT: Negative.   Eyes: Negative.   Respiratory: Negative.  Negative for shortness of breath.   Cardiovascular: Negative.  Negative for palpitations.  Gastrointestinal: Negative.   Endocrine: Negative.   Genitourinary: Negative.   Musculoskeletal: Negative.   Skin: Negative.   Neurological: Negative.   Hematological: Negative.   Psychiatric/Behavioral: Negative.   All other systems reviewed and are negative.      Objective:   Physical Exam  Vitals reviewed. Constitutional: She is oriented to person, place, and time. She appears well-developed and well-nourished. No distress.  HENT:  Head: Normocephalic and atraumatic.  Right Ear: External ear normal.  Mouth/Throat: Oropharynx is clear and moist.  Eyes: Pupils are equal, round, and reactive to light.  Neck: Normal range of motion. Neck supple. No thyromegaly present.  Cardiovascular: Normal rate, regular rhythm, normal heart sounds and intact distal pulses.   No murmur heard. Pulmonary/Chest: Effort normal and breath sounds normal. No respiratory distress. She has no wheezes. Right breast exhibits no inverted nipple, no mass, no nipple discharge, no skin change and no tenderness. Left breast exhibits no inverted nipple, no mass, no nipple discharge, no skin change and no tenderness. Breasts are symmetrical.  Abdominal: Soft. Bowel sounds are normal. She exhibits no distension. There is no tenderness.  Genitourinary: Vagina normal. No vaginal discharge found.  Bimanual exam-No adnexal masses or tenderness, ovaries nonpalpable   Musculoskeletal: Normal range of motion. She exhibits no edema and no tenderness.    Neurological: She is alert and oriented to person, place, and time. She has normal reflexes. No cranial nerve deficit.  Skin: Skin is warm and dry.  Psychiatric: She has a normal mood and affect. Her behavior is normal. Judgment and thought content normal.   BP 129/77  Pulse 68  Temp(Src) 97 F (36.1 C) (Oral)  Ht 5\' 3"  (1.6 m)  Wt 166 lb (75.297 kg)  BMI 29.41 kg/m2        Assessment & Plan:  1. Encounter for routine pelvic examination -Keep all appointments with Dr. Laurance Flatten  2. Cold sore -Do not pick at face -Keep sore clean and dry - valACYclovir (VALTREX) 1000 MG tablet; Take 2 tablets (2,000 mg total) by mouth 2 (two) times daily.  Dispense: 20 tablet; Refill: [place], FNP

## 2013-10-26 ENCOUNTER — Other Ambulatory Visit (INDEPENDENT_AMBULATORY_CARE_PROVIDER_SITE_OTHER): Payer: BC Managed Care – PPO

## 2013-10-26 DIAGNOSIS — E039 Hypothyroidism, unspecified: Secondary | ICD-10-CM

## 2013-10-26 NOTE — Progress Notes (Signed)
Patient came in for labs only.

## 2013-10-27 ENCOUNTER — Other Ambulatory Visit: Payer: Self-pay | Admitting: *Deleted

## 2013-10-27 LAB — THYROID PANEL WITH TSH
FREE THYROXINE INDEX: 2.4 (ref 1.2–4.9)
T3 Uptake Ratio: 29 % (ref 24–39)
T4, Total: 8.4 ug/dL (ref 4.5–12.0)
TSH: 0.96 u[IU]/mL (ref 0.450–4.500)

## 2013-10-27 MED ORDER — SYNTHROID 100 MCG PO TABS: ORAL_TABLET | ORAL | Status: DC

## 2013-11-15 ENCOUNTER — Other Ambulatory Visit: Payer: Self-pay | Admitting: Family Medicine

## 2013-11-18 ENCOUNTER — Other Ambulatory Visit: Payer: Self-pay | Admitting: Family Medicine

## 2014-01-19 ENCOUNTER — Ambulatory Visit (INDEPENDENT_AMBULATORY_CARE_PROVIDER_SITE_OTHER): Payer: BC Managed Care – PPO | Admitting: Family Medicine

## 2014-01-19 ENCOUNTER — Encounter: Payer: Self-pay | Admitting: Family Medicine

## 2014-01-19 VITALS — BP 139/77 | HR 68 | Temp 97.1°F | Ht 63.0 in | Wt 167.0 lb

## 2014-01-19 DIAGNOSIS — E039 Hypothyroidism, unspecified: Secondary | ICD-10-CM

## 2014-01-19 DIAGNOSIS — I10 Essential (primary) hypertension: Secondary | ICD-10-CM

## 2014-01-19 DIAGNOSIS — B001 Herpesviral vesicular dermatitis: Secondary | ICD-10-CM

## 2014-01-19 DIAGNOSIS — Z23 Encounter for immunization: Secondary | ICD-10-CM

## 2014-01-19 DIAGNOSIS — E559 Vitamin D deficiency, unspecified: Secondary | ICD-10-CM

## 2014-01-19 LAB — POCT CBC
GRANULOCYTE PERCENT: 71.7 % (ref 37–80)
HEMATOCRIT: 39.4 % (ref 37.7–47.9)
Hemoglobin: 13.1 g/dL (ref 12.2–16.2)
Lymph, poc: 1.3 (ref 0.6–3.4)
MCH, POC: 27.8 pg (ref 27–31.2)
MCHC: 33.2 g/dL (ref 31.8–35.4)
MCV: 83.7 fL (ref 80–97)
MPV: 6.9 fL (ref 0–99.8)
POC GRANULOCYTE: 3.9 (ref 2–6.9)
POC LYMPH PERCENT: 24.2 %L (ref 10–50)
Platelet Count, POC: 303 10*3/uL (ref 142–424)
RBC: 4.7 M/uL (ref 4.04–5.48)
RDW, POC: 13.1 %
WBC: 5.4 10*3/uL (ref 4.6–10.2)

## 2014-01-19 MED ORDER — VALACYCLOVIR HCL 1 G PO TABS: 2000.0000 mg | ORAL_TABLET | Freq: Two times a day (BID) | ORAL | Status: DC

## 2014-01-19 NOTE — Progress Notes (Signed)
Subjective:    Patient ID: Julie [name], female    DOB: 04-04-1955, 58 y.o.   MRN: [id number]  HPI Pt here for follow up and management of chronic medical problems.the patient's main complaint today is in her pain and congestion. She is due for several things to BE done. She needs a DEXA scan, and FOBT, lab work, she will receive a Prevnar vaccine today.          Patient Active Problem List   Diagnosis Date Noted  . Anxiety 07/03/2012  . Hypothyroidism 06/25/2012  . Anemia 11/03/2011  . Hypernatremia 11/03/2011  . Hyperchloremia 11/03/2011  . Palpitations 11/02/2011  . Chest pain 11/02/2011  . Hypertension   . Fibromyalgia   . Thyroid disease    Outpatient Encounter Prescriptions as of 01/19/2014  Medication Sig  . aspirin 81 MG tablet Take 81 mg by mouth daily.  Marland Kitchen atorvastatin (LIPITOR) 20 MG tablet TAKE 1 TABLET (20 MG TOTAL) BY MOUTH DAILY.  Marland Kitchen BRINTELLIX 20 MG TABS TAKE 1 TABLET EVERY DAY  . clonazePAM (KLONOPIN) 0.5 MG tablet Take 1 tablet (0.5 mg total) by mouth 3 (three) times daily as needed.  Marland Kitchen losartan-hydrochlorothiazide (HYZAAR) 50-12.5 MG per tablet Take 1 tablet by mouth daily.  . metoprolol succinate (TOPROL-XL) 25 MG 24 hr tablet TAKE 1 TABLET (25 MG TOTAL) BY MOUTH three TIMES DAILY.  . SYNTHROID 100 MCG tablet TAKE 1 TABLET BY MOUTH ONCE DAILY  . [DISCONTINUED] cycloSPORINE (RESTASIS) 0.05 % ophthalmic emulsion Place 1 drop into both eyes 2 (two) times daily.  . [DISCONTINUED] Polyethyl Glycol-Propyl Glycol (SYSTANE ULTRA OP) Apply 1 drop to eye at bedtime.  . nitroGLYCERIN (NITROSTAT) 0.4 MG SL tablet Place 1 tablet (0.4 mg total) under the tongue every 5 (five) minutes as needed for chest pain.  . pantoprazole (PROTONIX) 40 MG tablet TAKE 1 TABLET EVERY DAY  . valACYclovir (VALTREX) 1000 MG tablet Take 2 tablets (2,000 mg total) by mouth 2 (two) times daily.    Review of Systems  Constitutional: Negative.   HENT: Positive for ear pain (left ear  pressure and congestion).   Eyes: Negative.   Respiratory: Negative.   Cardiovascular: Negative.   Gastrointestinal: Negative.   Endocrine: Negative.   Genitourinary: Negative.   Musculoskeletal: Negative.   Skin: Negative.   Allergic/Immunologic: Negative.   Neurological: Negative.   Hematological: Negative.   Psychiatric/Behavioral: Negative.        Objective:   Physical Exam  Constitutional: She is oriented to person, place, and time. She appears well-developed and well-nourished.  HENT:  Head: Normocephalic and atraumatic.  Right Ear: External ear normal.  Left Ear: External ear normal.  Nose: Nose normal.  Mouth/Throat: Oropharynx is clear and moist. No oropharyngeal exudate.  Both TMs are normal and ear canals are normal. There is nasal congestion bilaterally.  Eyes: Conjunctivae and EOM are normal. Pupils are equal, round, and reactive to light. Right eye exhibits no discharge. Left eye exhibits no discharge. No scleral icterus.  Neck: Normal range of motion. Neck supple. No thyromegaly present.  No thyromegaly or anterior cervical adenopathy  Cardiovascular: Normal rate, regular rhythm, normal heart sounds and intact distal pulses.   No murmur heard. At 84/m  Pulmonary/Chest: Effort normal and breath sounds normal. No respiratory distress. She has no wheezes. She has no rales. She exhibits no tenderness.  Abdominal: Soft. Bowel sounds are normal. She exhibits no mass. There is no tenderness. There is no rebound and no guarding.  Musculoskeletal: Normal  range of motion. She exhibits no edema.  Lymphadenopathy:    She has no cervical adenopathy.  Neurological: She is alert and oriented to person, place, and time. She has normal reflexes.  Skin: Skin is warm and dry. No rash noted.  Psychiatric: She has a normal mood and affect. Her behavior is normal. Judgment and thought content normal.  Nursing note and vitals reviewed.  BP 139/77 mmHg  Pulse 68  Temp(Src) 97.1 F  (36.2 C) (Oral)  Ht 5' 3"  (1.6 m)  Wt 167 lb (75.751 kg)  BMI 29.59 kg/m2        Assessment & Plan:  1. Essential hypertension - POCT CBC - BMP8+EGFR - Hepatic function panel - Lipid panel  2. Hypothyroidism, unspecified hypothyroidism type - POCT CBC - Thyroid Panel With TSH  3. Vitamin D deficiency - POCT CBC - Vit D  25 hydroxy (rtn osteoporosis monitoring)  4. Cold sore - valACYclovir (VALTREX) 1000 MG tablet; Take 2 tablets (2,000 mg total) by mouth 2 (two) times daily.  Dispense: 20 tablet; Refill: 2 - POCT CBC  Meds ordered this encounter  Medications  . valACYclovir (VALTREX) 1000 MG tablet    Sig: Take 2 tablets (2,000 mg total) by mouth 2 (two) times daily.    Dispense:  20 tablet    Refill:  2    Take 2071m BID for 1 day at first signs of cold sore   Patient Instructions  Continue current medications. Continue good therapeutic lifestyle changes which include good diet and exercise. Fall precautions discussed with patient. If an FOBT was given today- please return it to our front desk. If you are over 562years old - you may need Prevnar 174or the adult Pneumonia vaccine.  Flu Shots will be available at our office starting mid- September. Please call and schedule a FLU CLINIC APPOINTMENT.  Drink plenty of fluids Keep the house cooler Use saline nose spray Gargle with warm salty water Use a cool mist humidifier Take plain Claritin for allergy   DArrie SenateMD

## 2014-01-19 NOTE — Patient Instructions (Addendum)
Continue current medications. Continue good therapeutic lifestyle changes which include good diet and exercise. Fall precautions discussed with patient. If an FOBT was given today- please return it to our front desk. If you are over 60 years old - you may need Prevnar 44 or the adult Pneumonia vaccine.  Flu Shots will be available at our office starting mid- September. Please call and schedule a FLU CLINIC APPOINTMENT.  Drink plenty of fluids Keep the house cooler Use saline nose spray Gargle with warm salty water Use a cool mist humidifier Take plain Claritin for allergy Return to clinic for shingles shot sometime later in December

## 2014-01-20 LAB — HEPATIC FUNCTION PANEL
ALBUMIN: 4.6 g/dL (ref 3.6–4.8)
ALT: 15 IU/L (ref 0–32)
AST: 17 IU/L (ref 0–40)
Alkaline Phosphatase: 90 IU/L (ref 39–117)
BILIRUBIN TOTAL: 0.3 mg/dL (ref 0.0–1.2)
Bilirubin, Direct: 0.08 mg/dL (ref 0.00–0.40)
TOTAL PROTEIN: 6.8 g/dL (ref 6.0–8.5)

## 2014-01-20 LAB — THYROID PANEL WITH TSH
Free Thyroxine Index: 2.3 (ref 1.2–4.9)
T3 Uptake Ratio: 27 % (ref 24–39)
T4 TOTAL: 8.6 ug/dL (ref 4.5–12.0)
TSH: 2.26 u[IU]/mL (ref 0.450–4.500)

## 2014-01-20 LAB — BMP8+EGFR
BUN/Creatinine Ratio: 16 (ref 11–26)
BUN: 16 mg/dL (ref 8–27)
CO2: 25 mmol/L (ref 18–29)
CREATININE: 0.99 mg/dL (ref 0.57–1.00)
Calcium: 9.6 mg/dL (ref 8.7–10.3)
Chloride: 100 mmol/L (ref 97–108)
GFR calc Af Amer: 72 mL/min/{1.73_m2} (ref >59)
GFR calc non Af Amer: 62 mL/min/{1.73_m2} (ref >59)
GLUCOSE: 89 mg/dL (ref 65–99)
Potassium: 4.1 mmol/L (ref 3.5–5.2)
Sodium: 140 mmol/L (ref 134–144)

## 2014-01-20 LAB — LIPID PANEL
CHOLESTEROL TOTAL: 176 mg/dL (ref 100–199)
Chol/HDL Ratio: 3.7 ratio units (ref 0.0–4.4)
HDL: 47 mg/dL (ref >39)
LDL CALC: 106 mg/dL — AB (ref 0–99)
TRIGLYCERIDES: 117 mg/dL (ref 0–149)
VLDL Cholesterol Cal: 23 mg/dL (ref 5–40)

## 2014-01-20 LAB — VITAMIN D 25 HYDROXY (VIT D DEFICIENCY, FRACTURES): Vit D, 25-Hydroxy: 32.6 ng/mL (ref 30.0–100.0)

## 2014-01-20 NOTE — Progress Notes (Signed)
Quick Note:  Blood sugar , renal and electrolytes are WNL All liver function tests are WNL  The vitamin D is WNL but is at the low end of the normal range---please make sure hat the patient is taking vitamin D3, 1000 daily, the NOW brand All thyroid function tests are WNL ---continue current treatment With a traditional lipid panel, the LDL-C is elevated at 106, should be less than 100, please try to do better with diet and TLC ______

## 2014-01-21 ENCOUNTER — Telehealth: Payer: Self-pay | Admitting: *Deleted

## 2014-01-21 NOTE — Telephone Encounter (Signed)
lmtcb regarding lab results. 

## 2014-01-21 NOTE — Telephone Encounter (Signed)
-----   Message from Chipper Herb, MD sent at 01/20/2014  7:03 AM EST ----- Blood sugar , renal and electrolytes are WNL All liver function tests are WNL  The vitamin D is WNL but is at the low end of the normal range---please make sure hat the patient is taking vitamin D3, 1000 daily, the NOW brand All thyroid function tests are WNL ---continue current treatment With a traditional lipid panel, the LDL-C is elevated at 106, should be less than 100, please try to do better with diet and TLC

## 2014-01-22 NOTE — Telephone Encounter (Signed)
Pt aware of lab results and will add Vit D3 to med list

## 2014-01-22 NOTE — Telephone Encounter (Signed)
-----   Message from Chipper Herb, MD sent at 01/20/2014  7:03 AM EST ----- Blood sugar , renal and electrolytes are WNL All liver function tests are WNL  The vitamin D is WNL but is at the low end of the normal range---please make sure hat the patient is taking vitamin D3, 1000 daily, the NOW brand All thyroid function tests are WNL ---continue current treatment With a traditional lipid panel, the LDL-C is elevated at 106, should be less than 100, please try to do better with diet and TLC

## 2014-02-16 ENCOUNTER — Other Ambulatory Visit: Payer: Self-pay | Admitting: Family Medicine

## 2014-04-29 ENCOUNTER — Other Ambulatory Visit: Payer: Self-pay | Admitting: Family Medicine

## 2014-05-18 ENCOUNTER — Other Ambulatory Visit: Payer: Self-pay | Admitting: Nurse Practitioner

## 2014-07-09 ENCOUNTER — Other Ambulatory Visit: Payer: Self-pay

## 2014-07-09 DIAGNOSIS — Z1231 Encounter for screening mammogram for malignant neoplasm of breast: Secondary | ICD-10-CM

## 2014-07-12 ENCOUNTER — Ambulatory Visit
Admission: RE | Admit: 2014-07-12 | Discharge: 2014-07-12 | Disposition: A | Discharge status: Home / Self Care | Payer: BC Managed Care – PPO | Source: Ambulatory Visit

## 2014-07-12 DIAGNOSIS — Z1231 Encounter for screening mammogram for malignant neoplasm of breast: Secondary | ICD-10-CM

## 2014-07-20 ENCOUNTER — Ambulatory Visit (INDEPENDENT_AMBULATORY_CARE_PROVIDER_SITE_OTHER): Payer: BC Managed Care – PPO

## 2014-07-20 ENCOUNTER — Encounter: Payer: Self-pay | Admitting: Family Medicine

## 2014-07-20 ENCOUNTER — Ambulatory Visit (INDEPENDENT_AMBULATORY_CARE_PROVIDER_SITE_OTHER): Payer: BC Managed Care – PPO | Admitting: Family Medicine

## 2014-07-20 VITALS — BP 136/85 | HR 70 | Temp 97.1°F | Ht 63.0 in | Wt 161.0 lb

## 2014-07-20 DIAGNOSIS — L820 Inflamed seborrheic keratosis: Secondary | ICD-10-CM | POA: Diagnosis not present

## 2014-07-20 DIAGNOSIS — Z780 Asymptomatic menopausal state: Secondary | ICD-10-CM

## 2014-07-20 DIAGNOSIS — E039 Hypothyroidism, unspecified: Secondary | ICD-10-CM | POA: Diagnosis not present

## 2014-07-20 DIAGNOSIS — I10 Essential (primary) hypertension: Secondary | ICD-10-CM

## 2014-07-20 DIAGNOSIS — E559 Vitamin D deficiency, unspecified: Secondary | ICD-10-CM

## 2014-07-20 DIAGNOSIS — B001 Herpesviral vesicular dermatitis: Secondary | ICD-10-CM | POA: Diagnosis not present

## 2014-07-20 DIAGNOSIS — M797 Fibromyalgia: Secondary | ICD-10-CM

## 2014-07-20 MED ORDER — PANTOPRAZOLE SODIUM 40 MG PO TBEC: DELAYED_RELEASE_TABLET | ORAL | Status: DC

## 2014-07-20 MED ORDER — VALACYCLOVIR HCL 1 G PO TABS: 2000.0000 mg | ORAL_TABLET | Freq: Two times a day (BID) | ORAL | Status: DC

## 2014-07-20 MED ORDER — VITAMIN D (ERGOCALCIFEROL) 1.25 MG (50000 UNIT) PO CAPS: 50000.0000 [IU] | ORAL_CAPSULE | ORAL | Status: DC

## 2014-07-20 MED ORDER — LOSARTAN POTASSIUM-HCTZ 50-12.5 MG PO TABS: 1.0000 | ORAL_TABLET | Freq: Every day | ORAL | Status: DC

## 2014-07-20 MED ORDER — SYNTHROID 100 MCG PO TABS: 100.0000 ug | ORAL_TABLET | Freq: Every day | ORAL | Status: DC

## 2014-07-20 MED ORDER — ATORVASTATIN CALCIUM 20 MG PO TABS: ORAL_TABLET | ORAL | Status: DC

## 2014-07-20 NOTE — Patient Instructions (Addendum)
Continue current medications. Continue good therapeutic lifestyle changes which include good diet and exercise. Fall precautions discussed with patient. If an FOBT was given today- please return it to our front desk. If you are over 61 years old - you may need Prevnar 47 or the adult Pneumonia vaccine.  Flu Shots are still available at our office. If you still haven't had one please call to set up a nurse visit to get one.   After your visit with [place] today you will receive a survey in the mail or online from [employer] regarding your care with [place]. Please take a moment to fill this out. Your feedback is very important to [place] as you can help [place] better understand your patient needs as well as improve your experience and satisfaction. WE CARE ABOUT YOU!!!   Take vitamin D prescribed 1 weekly for 12 weeks and repeat vitamin D level in 3-4 months there will be one refill on this prescription. Be sure and tell the gastroenterologist that you have been having trouble with reflux and acid problems so that when she does your colonoscopy she may want to do an endoscopy also In the meantime take Zantac 150 twice daily if needed before meals Continue to work on weight loss Return the FOBT We will call you with the chest x-ray results as soon as they become available Also we will review the DEXA scan results when it is done

## 2014-07-20 NOTE — Progress Notes (Signed)
Subjective:    Patient ID: Julie [name], female    DOB: Jul 30, 1955, 59 y.o.   MRN: [id number]  HPI Pt here for follow up and management of chronic medical problems which includes hypothyroid and hypertension. She is taking medications regularly. The patient today is doing well with no complaints. She is requesting a refill on all her medicines. She is due to get a DEXA scan a chest x-ray and to return an FOBT.       Patient Active Problem List   Diagnosis Date Noted  . Anxiety 07/03/2012  . Hypothyroidism 06/25/2012  . Anemia 11/03/2011  . Hypernatremia 11/03/2011  . Hyperchloremia 11/03/2011  . Palpitations 11/02/2011  . Chest pain 11/02/2011  . Hypertension   . Fibromyalgia   . Thyroid disease    Outpatient Encounter Prescriptions as of 07/20/2014  Medication Sig  . aspirin 81 MG tablet Take 81 mg by mouth daily.  Marland Kitchen atorvastatin (LIPITOR) 20 MG tablet TAKE 1 TABLET (20 MG TOTAL) BY MOUTH DAILY.  Marland Kitchen BRINTELLIX 20 MG TABS TAKE 1 TABLET EVERY DAY  . clonazePAM (KLONOPIN) 0.5 MG tablet Take 1 tablet (0.5 mg total) by mouth 3 (three) times daily as needed.  Marland Kitchen losartan-hydrochlorothiazide (HYZAAR) 50-12.5 MG per tablet Take 1 tablet by mouth daily.  . nitroGLYCERIN (NITROSTAT) 0.4 MG SL tablet Place 1 tablet (0.4 mg total) under the tongue every 5 (five) minutes as needed for chest pain.  . pantoprazole (PROTONIX) 40 MG tablet TAKE 1 TABLET EVERY DAY  . SYNTHROID 100 MCG tablet TAKE 1 TABLET BY MOUTH ONCE DAILY  . valACYclovir (VALTREX) 1000 MG tablet Take 2 tablets (2,000 mg total) by mouth 2 (two) times daily.  . metoprolol succinate (TOPROL-XL) 25 MG 24 hr tablet TAKE 1 TABLET (25 MG TOTAL) BY MOUTH three TIMES DAILY. (Patient not taking: Reported on 07/20/2014)   No facility-administered encounter medications on file as of 07/20/2014.      Review of Systems  Constitutional: Negative.   HENT: Negative.   Eyes: Negative.   Respiratory: Negative.   Cardiovascular:  Negative.   Gastrointestinal: Negative.   Endocrine: Negative.   Genitourinary: Negative.   Musculoskeletal: Negative.   Skin: Negative.   Allergic/Immunologic: Negative.   Neurological: Negative.   Hematological: Negative.   Psychiatric/Behavioral: Negative.        Objective:   Physical Exam  Constitutional: She is oriented to person, place, and time. She appears well-developed and well-nourished. No distress.  HENT:  Head: Normocephalic and atraumatic.  Right Ear: External ear normal.  Left Ear: External ear normal.  Nose: Nose normal.  Mouth/Throat: Oropharynx is clear and moist.  Eyes: Conjunctivae and EOM are normal. Pupils are equal, round, and reactive to light. Right eye exhibits no discharge. Left eye exhibits no discharge. No scleral icterus.  Neck: Normal range of motion. Neck supple. No JVD present. No thyromegaly present.  No thyromegaly or anterior cervical adenopathy  Cardiovascular: Normal rate, regular rhythm, normal heart sounds and intact distal pulses.  Exam reveals no gallop and no friction rub.   No murmur heard. At 72/m with a regular rate and rhythm  Pulmonary/Chest: Effort normal and breath sounds normal. No respiratory distress. She has no wheezes. She has no rales. She exhibits no tenderness.  Chest is clear anteriorly and posteriorly  Abdominal: Soft. Bowel sounds are normal. She exhibits no mass. There is no tenderness. There is no rebound and no guarding.  Minimal epigastric tenderness and no inguinal adenopathy  Musculoskeletal: Normal range  of motion. She exhibits no edema.  Lymphadenopathy:    She has no cervical adenopathy.  Neurological: She is alert and oriented to person, place, and time. She has normal reflexes. No cranial nerve deficit.  Skin: Skin is warm and dry. No rash noted.  The patient had an irritated seborrheic keratosis on her right neck which cryotherapy was done and she tolerated the procedure well.  Psychiatric: She has a  normal mood and affect. Her behavior is normal. Judgment and thought content normal.  Nursing note and vitals reviewed.  BP 136/85 mmHg  Pulse 70  Temp(Src) 97.1 F (36.2 C) (Oral)  Ht 5\' 3"  (1.6 m)  Wt 161 lb (73.029 kg)  BMI 28.53 kg/m2  [facility] reading (PRIMARY) by  Dr. Brunilda Payor x-ray--no active disease  DEXA scan had normal bone density and patient should continue with walking fall prevention taking vitamin D3 and I thousand milligrams calcium daily                                       Assessment & Plan:  1. Essential hypertension -The blood pressure is good today and the patient will continue with her current treatment - DG Chest 2 View; Future  2. Hypothyroidism, unspecified hypothyroidism type -Her most recent thyroid profile was done through lab core and out side location and was within normal limits and therefore the patient should continue with current treatment.  3. Vitamin D deficiency -The vitamin D level was low and she will be treated with 50,000 units weekly for 12 weeks with 1 refill and will repeat the vitamin D level in 3-4 months - DG Bone Density; Future - Vitamin D, Ergocalciferol, (DRISDOL) 50000 UNITS CAPS capsule; Take 1 capsule (50,000 Units total) by mouth every 7 (seven) days.  Dispense: 12 capsule; Refill: 1  4. Cold sore -She is having no problems with this today but continues to take Valtrex for occurrences - valACYclovir (VALTREX) 1000 MG tablet; Take 2 tablets (2,000 mg total) by mouth 2 (two) times daily.  Dispense: 30 tablet; Refill: 2  5. Postmenopausal -No complaints with postmenopausal symptoms today. - DG Bone Density; Future  6. Fibromyalgia -The fibromyalgia has been under fairly good control.  7. Seborrheic keratosis, inflamed -Cryotherapy was performed on the irritated seborrheic keratosis to her right neck.  Patient Instructions  Continue current medications. Continue good therapeutic lifestyle changes which include good  diet and exercise. Fall precautions discussed with patient. If an FOBT was given today- please return it to our front desk. If you are over 8 years old - you may need Prevnar 85 or the adult Pneumonia vaccine.  Flu Shots are still available at our office. If you still haven't had one please call to set up a nurse visit to get one.   After your visit with [place] today you will receive a survey in the mail or online from [employer] regarding your care with [place]. Please take a moment to fill this out. Your feedback is very important to [place] as you can help [place] better understand your patient needs as well as improve your experience and satisfaction. WE CARE ABOUT YOU!!!   Take vitamin D prescribed 1 weekly for 12 weeks and repeat vitamin D level in 3-4 months there will be one refill on this prescription. Be sure and tell the gastroenterologist that you have been having trouble with reflux and acid problems so  that when she does your colonoscopy she may want to do an endoscopy also In the meantime take Zantac 150 twice daily if needed before meals Continue to work on weight loss Return the FOBT We will call you with the chest x-ray results as soon as they become available Also we will review the DEXA scan results when it is done   Arrie Senate MD

## 2014-10-01 ENCOUNTER — Ambulatory Visit (INDEPENDENT_AMBULATORY_CARE_PROVIDER_SITE_OTHER): Payer: BC Managed Care – PPO | Admitting: *Deleted

## 2014-10-01 DIAGNOSIS — R300 Dysuria: Secondary | ICD-10-CM

## 2014-10-01 LAB — POCT URINALYSIS DIPSTICK
Bilirubin, UA: NEGATIVE
Blood, UA: NEGATIVE
Glucose, UA: NEGATIVE
KETONES UA: NEGATIVE
NITRITE UA: NEGATIVE
UROBILINOGEN UA: NEGATIVE
pH, UA: 6

## 2014-10-01 LAB — POCT UA - MICROSCOPIC ONLY
Bacteria, U Microscopic: NEGATIVE
Casts, Ur, LPF, POC: NEGATIVE
Crystals, Ur, HPF, POC: NEGATIVE
RBC, urine, microscopic: NEGATIVE
Yeast, UA: NEGATIVE

## 2014-10-01 MED ORDER — NITROFURANTOIN MONOHYD MACRO 100 MG PO CAPS: 100.0000 mg | ORAL_CAPSULE | Freq: Two times a day (BID) | ORAL | Status: DC

## 2014-12-31 ENCOUNTER — Other Ambulatory Visit: Payer: Self-pay | Admitting: Family Medicine

## 2015-01-07 ENCOUNTER — Other Ambulatory Visit: Payer: Self-pay | Admitting: Family Medicine

## 2015-01-19 ENCOUNTER — Encounter: Payer: Self-pay | Admitting: Family Medicine

## 2015-01-19 ENCOUNTER — Ambulatory Visit (INDEPENDENT_AMBULATORY_CARE_PROVIDER_SITE_OTHER): Payer: BC Managed Care – PPO | Admitting: Family Medicine

## 2015-01-19 VITALS — BP 127/87 | HR 72 | Temp 96.8°F | Ht 63.0 in | Wt 163.0 lb

## 2015-01-19 DIAGNOSIS — M797 Fibromyalgia: Secondary | ICD-10-CM

## 2015-01-19 DIAGNOSIS — E559 Vitamin D deficiency, unspecified: Secondary | ICD-10-CM | POA: Diagnosis not present

## 2015-01-19 DIAGNOSIS — F419 Anxiety disorder, unspecified: Secondary | ICD-10-CM

## 2015-01-19 DIAGNOSIS — E039 Hypothyroidism, unspecified: Secondary | ICD-10-CM

## 2015-01-19 DIAGNOSIS — R002 Palpitations: Secondary | ICD-10-CM

## 2015-01-19 DIAGNOSIS — Z23 Encounter for immunization: Secondary | ICD-10-CM | POA: Diagnosis not present

## 2015-01-19 DIAGNOSIS — I10 Essential (primary) hypertension: Secondary | ICD-10-CM

## 2015-01-19 DIAGNOSIS — M7711 Lateral epicondylitis, right elbow: Secondary | ICD-10-CM | POA: Diagnosis not present

## 2015-01-19 NOTE — Patient Instructions (Addendum)
Continue current medications. Continue good therapeutic lifestyle changes which include good diet and exercise. Fall precautions discussed with patient. If an FOBT was given today- please return it to our front desk. If you are over 61 years old - you may need Prevnar 59 or the adult Pneumonia vaccine.  **Flu shots are available--- please call and schedule a FLU-CLINIC appointment**  After your visit with [place] today you will receive a survey in the mail or online from [employer] regarding your care with [place]. Please take a moment to fill this out. Your feedback is very important to [place] as you can help [place] better understand your patient needs as well as improve your experience and satisfaction. WE CARE ABOUT YOU!!!   We will call the patient with the results of her lab work as soon as those results become available we will then do our best to get the cholesterol under control with possibly a different medication. We will have the clinical pharmacist to review all her medicines to see if any of these could be playing a role with the palpitations that she is having once we know the thyroid medicine is appropriately being dosed. The patient should continue to exercise regularly and avoid caffeine This winter she should drink plenty of fluids and stay well hydrated and use nasal saline for nasal congestion She should use a tennis elbow brace on the right elbow as directed to see if this helps relieve some of the discomfort along with using some warm wet compresses to the elbow

## 2015-01-19 NOTE — Progress Notes (Signed)
Subjective:    Patient ID: Julie [name], female    DOB: [birth year], 59 y.o.   MRN: [id number]  HPI Pt here for follow up and management of chronic medical problems which includes hypothyroid and hypertension. She is taking medications regularly. The patient does complain of some palpitations and she is thinking this may be related to her medication. She is also complaining with some pain in the right elbow. The patient does not recall any injury to the elbow. She says the palpitations were associated with the atorvastatin because when she left this office she did not have the palpitations anymore and she said this seemed to get progressively worse every time she took the medicine and that is why she is not taking that anymore. The patient denies chest pain shortness of breath trouble swallowing and heartburn indigestion nausea vomiting or diarrhea or blood in the stool. She does have occasional indigestion after eating certain foods. She does not drink caffeine. She is passing her water without problems.      Patient Active Problem List   Diagnosis Date Noted  . Anxiety 07/03/2012  . Hypothyroidism 06/25/2012  . Anemia 11/03/2011  . Hypernatremia 11/03/2011  . Hyperchloremia 11/03/2011  . Palpitations 11/02/2011  . Chest pain 11/02/2011  . Hypertension   . Fibromyalgia   . Thyroid disease    Outpatient Encounter Prescriptions as of 01/19/2015  Medication Sig  . aspirin 81 MG tablet Take 81 mg by mouth daily.  Marland Kitchen BRINTELLIX 20 MG TABS TAKE 1 TABLET EVERY DAY  . clonazePAM (KLONOPIN) 0.5 MG tablet Take 1 tablet (0.5 mg total) by mouth 3 (three) times daily as needed.  Marland Kitchen losartan-hydrochlorothiazide (HYZAAR) 50-12.5 MG per tablet Take 1 tablet by mouth daily.  . nitrofurantoin, macrocrystal-monohydrate, (MACROBID) 100 MG capsule Take 1 capsule (100 mg total) by mouth 2 (two) times daily. 1 po BId  . pantoprazole (PROTONIX) 40 MG tablet TAKE 1 TABLET EVERY DAY  . SYNTHROID 100 MCG  tablet Take 1 tablet (100 mcg total) by mouth daily.  . valACYclovir (VALTREX) 1000 MG tablet Take 2 tablets (2,000 mg total) by mouth 2 (two) times daily.  . Vitamin D, Ergocalciferol, (DRISDOL) 50000 UNITS CAPS capsule TAKE 1 CAPSULE (50,000 UNITS TOTAL) BY MOUTH EVERY 7 (SEVEN) DAYS.  . [DISCONTINUED] metoprolol succinate (TOPROL-XL) 25 MG 24 hr tablet TAKE 1 TABLET (25 MG TOTAL) BY MOUTH three TIMES DAILY.  Marland Kitchen atorvastatin (LIPITOR) 20 MG tablet TAKE 1 TABLET (20 MG TOTAL) BY MOUTH DAILY. (Patient not taking: Reported on 01/19/2015)  . nitroGLYCERIN (NITROSTAT) 0.4 MG SL tablet Place 1 tablet (0.4 mg total) under the tongue every 5 (five) minutes as needed for chest pain. (Patient not taking: Reported on 01/19/2015)   No facility-administered encounter medications on file as of 01/19/2015.      Review of Systems  Constitutional: Negative.   HENT: Negative.   Eyes: Negative.   Respiratory: Negative.   Cardiovascular: Positive for palpitations (pt thinks it may be related to meds).  Gastrointestinal: Negative.   Endocrine: Negative.   Genitourinary: Negative.   Musculoskeletal: Positive for arthralgias (right elbow pain).  Skin: Negative.   Allergic/Immunologic: Negative.   Neurological: Negative.   Hematological: Negative.   Psychiatric/Behavioral: Negative.        Objective:   Physical Exam  Constitutional: She is oriented to person, place, and time. She appears well-developed and well-nourished.  HENT:  Head: Normocephalic and atraumatic.  Right Ear: External ear normal.  Left Ear: External ear normal.  Mouth/Throat: Oropharynx is clear and moist.  Nasal congestion bilaterally  Eyes: Conjunctivae and EOM are normal. Pupils are equal, round, and reactive to light. Right eye exhibits no discharge. Left eye exhibits no discharge. No scleral icterus.  Neck: Normal range of motion. Neck supple. No thyromegaly present.  Cardiovascular: Normal rate, regular rhythm, normal heart  sounds and intact distal pulses.   No murmur heard. The heart is regular at 72/m  Pulmonary/Chest: Effort normal and breath sounds normal. No respiratory distress. She has no wheezes. She has no rales. She exhibits no tenderness.  Abdominal: Soft. Bowel sounds are normal. She exhibits no mass. There is no tenderness. There is no rebound and no guarding.  No epigastric tenderness or bruits  Musculoskeletal: Normal range of motion. She exhibits tenderness. She exhibits no edema.  The right elbow was slightly tender at the right lateral epicondyle Dial area.  Lymphadenopathy:    She has no cervical adenopathy.  Neurological: She is alert and oriented to person, place, and time. She has normal reflexes. No cranial nerve deficit.  Skin: Skin is warm and dry. No rash noted.  Psychiatric: She has a normal mood and affect. Her behavior is normal. Judgment and thought content normal.  Nursing note and vitals reviewed.  BP 127/87 mmHg  Pulse 72  Temp(Src) 96.8 F (36 C) (Oral)  Ht 5' 3"  (1.6 m)  Wt 163 lb (73.936 kg)  BMI 28.88 kg/m2        Assessment & Plan:  1. Essential hypertension -The blood pressure is good today the patient will continue with current treatment - BMP8+EGFR - CBC with Differential/Platelet - Hepatic function panel - NMR, lipoprofile  2. Hypothyroidism, unspecified hypothyroidism type -Continue current treatment pending results of lab work. We will pay particular attention to this because of the increase his history of palpitations and anxiety recently. - CBC with Differential/Platelet - Thyroid Panel With TSH  3. Vitamin D deficiency -Continue current treatment pending results of lab work - CBC with Differential/Platelet - VITAMIN D 25 Hydroxy (Vit-D Deficiency, Fractures)  4. Right lateral epicondylitis -Patient will use a tennis elbow brace along with some warm wet compresses to see if this mild case of epicondylitis can be resolved on its own.  5.  Fibromyalgia  6. Palpitations -Check thyroid profile and continue to avoid caffeine  7. Anxiety -She will continue to take Klonopin on an as needed basis and take as little as possible.  Patient Instructions  Continue current medications. Continue good therapeutic lifestyle changes which include good diet and exercise. Fall precautions discussed with patient. If an FOBT was given today- please return it to our front desk. If you are over 72 years old - you may need Prevnar 67 or the adult Pneumonia vaccine.  **Flu shots are available--- please call and schedule a FLU-CLINIC appointment**  After your visit with [place] today you will receive a survey in the mail or online from [employer] regarding your care with [place]. Please take a moment to fill this out. Your feedback is very important to [place] as you can help [place] better understand your patient needs as well as improve your experience and satisfaction. WE CARE ABOUT YOU!!!   We will call the patient with the results of her lab work as soon as those results become available we will then do our best to get the cholesterol under control with possibly a different medication. We will have the clinical pharmacist to review all her medicines to see if any  of these could be playing a role with the palpitations that she is having once we know the thyroid medicine is appropriately being dosed. The patient should continue to exercise regularly and avoid caffeine This winter she should drink plenty of fluids and stay well hydrated and use nasal saline for nasal congestion She should use a tennis elbow brace on the right elbow as directed to see if this helps relieve some of the discomfort along with using some warm wet compresses to the elbow   Arrie Senate MD

## 2015-01-20 LAB — CBC WITH DIFFERENTIAL/PLATELET
BASOS: 1 %
Basophils Absolute: 0 10*3/uL (ref 0.0–0.2)
EOS (ABSOLUTE): 0.2 10*3/uL (ref 0.0–0.4)
Eos: 4 %
Hematocrit: 39.3 % (ref 34.0–46.6)
Hemoglobin: 13.5 g/dL (ref 11.1–15.9)
Immature Grans (Abs): 0 10*3/uL (ref 0.0–0.1)
Immature Granulocytes: 0 %
Lymphocytes Absolute: 1.3 10*3/uL (ref 0.7–3.1)
Lymphs: 25 %
MCH: 28.8 pg (ref 26.6–33.0)
MCHC: 34.4 g/dL (ref 31.5–35.7)
MCV: 84 fL (ref 79–97)
MONOS ABS: 0.3 10*3/uL (ref 0.1–0.9)
Monocytes: 7 %
NEUTROS ABS: 3.2 10*3/uL (ref 1.4–7.0)
Neutrophils: 63 %
PLATELETS: 296 10*3/uL (ref 150–379)
RBC: 4.69 x10E6/uL (ref 3.77–5.28)
RDW: 13.6 % (ref 12.3–15.4)
WBC: 5.1 10*3/uL (ref 3.4–10.8)

## 2015-01-20 LAB — THYROID PANEL WITH TSH
FREE THYROXINE INDEX: 2.6 (ref 1.2–4.9)
T3 Uptake Ratio: 28 % (ref 24–39)
T4 TOTAL: 9.2 ug/dL (ref 4.5–12.0)
TSH: 2.08 u[IU]/mL (ref 0.450–4.500)

## 2015-01-20 LAB — HEPATIC FUNCTION PANEL
ALT: 15 IU/L (ref 0–32)
AST: 12 IU/L (ref 0–40)
Albumin: 4.5 g/dL (ref 3.6–4.8)
Alkaline Phosphatase: 86 IU/L (ref 39–117)
Bilirubin Total: 0.5 mg/dL (ref 0.0–1.2)
Bilirubin, Direct: 0.12 mg/dL (ref 0.00–0.40)
Total Protein: 7.1 g/dL (ref 6.0–8.5)

## 2015-01-20 LAB — BMP8+EGFR
BUN/Creatinine Ratio: 17 (ref 11–26)
BUN: 16 mg/dL (ref 8–27)
CHLORIDE: 99 mmol/L (ref 97–106)
CO2: 26 mmol/L (ref 18–29)
Calcium: 9.7 mg/dL (ref 8.7–10.3)
Creatinine, Ser: 0.96 mg/dL (ref 0.57–1.00)
GFR, EST AFRICAN AMERICAN: 74 mL/min/{1.73_m2} (ref >59)
GFR, EST NON AFRICAN AMERICAN: 64 mL/min/{1.73_m2} (ref >59)
Glucose: 83 mg/dL (ref 65–99)
POTASSIUM: 4.1 mmol/L (ref 3.5–5.2)
SODIUM: 140 mmol/L (ref 136–144)

## 2015-01-20 LAB — VITAMIN D 25 HYDROXY (VIT D DEFICIENCY, FRACTURES): Vit D, 25-Hydroxy: 40.6 ng/mL (ref 30.0–100.0)

## 2015-01-20 LAB — NMR, LIPOPROFILE
Cholesterol: 226 mg/dL — ABNORMAL HIGH (ref 100–199)
HDL CHOLESTEROL BY NMR: 50 mg/dL (ref >39)
HDL Particle Number: 30.3 umol/L — ABNORMAL LOW (ref >=30.5)
LDL PARTICLE NUMBER: 1967 nmol/L — AB (ref <1000)
LDL SIZE: 20.7 nm (ref >20.5)
LDL-C: 153 mg/dL — AB (ref 0–99)
LP-IR Score: 43 (ref <=45)
Small LDL Particle Number: 848 nmol/L — ABNORMAL HIGH (ref <=527)
TRIGLYCERIDES BY NMR: 117 mg/dL (ref 0–149)

## 2015-02-14 ENCOUNTER — Encounter: Payer: Self-pay | Admitting: Family Medicine

## 2015-02-14 ENCOUNTER — Ambulatory Visit (INDEPENDENT_AMBULATORY_CARE_PROVIDER_SITE_OTHER): Payer: BC Managed Care – PPO | Admitting: Family Medicine

## 2015-02-14 VITALS — BP 110/65 | HR 81 | Temp 97.3°F | Ht 63.0 in | Wt 165.4 lb

## 2015-02-14 DIAGNOSIS — J040 Acute laryngitis: Secondary | ICD-10-CM | POA: Diagnosis not present

## 2015-02-14 MED ORDER — AZITHROMYCIN 250 MG PO TABS: ORAL_TABLET | ORAL | Status: DC

## 2015-02-14 MED ORDER — FLUTICASONE PROPIONATE 50 MCG/ACT NA SUSP: 1.0000 | Freq: Two times a day (BID) | NASAL | Status: DC | PRN

## 2015-02-14 NOTE — Progress Notes (Signed)
BP 110/65 mmHg  Pulse 81  Temp(Src) 97.3 F (36.3 C) (Oral)  Ht 5\' 3"  (1.6 m)  Wt 165 lb 6.4 oz (75.025 kg)  BMI 29.31 kg/m2   Subjective:    Patient ID: Julie [name], female    DOB: [birth year], 59 y.o.   MRN: [id number]  HPI: Julie [name] is a 59 y.o. female presenting on 02/14/2015 for Sinusitis and Cough   HPI Sore throat and cough Patient has had persistent sore throat and cough and loss of her voice over the past 3 weeks. She initially tried some Tylenol Sinus and cold but feels like she just can't kick this current illness. 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 She denies any fevers but does admit to chills. She also just has general malaise and fatigue with this illness. Her cough is sometimes productive of yellow green sputum but most of the time is dry.  Relevant past medical, surgical, family and social history reviewed and updated as indicated. Interim medical history since our last visit reviewed. Allergies and medications reviewed and updated.  Review of Systems  Constitutional: Negative for fever and chills.  HENT: Positive for congestion, postnasal drip, rhinorrhea, sinus pressure and sore throat. Negative for ear discharge, ear pain and sneezing.   Eyes: Negative for pain, redness and visual disturbance.  Respiratory: Positive for cough. Negative for chest tightness and shortness of breath.   Cardiovascular: Negative for chest pain and leg swelling.  Genitourinary: Negative for dysuria and difficulty urinating.  Musculoskeletal: Negative for back pain and gait problem.  Skin: Negative for rash.  Neurological: Negative for light-headedness and headaches.  Psychiatric/Behavioral: Negative for behavioral problems and agitation.  All other systems reviewed and are negative.   Per HPI unless specifically indicated above     Medication List       This list is accurate as of: 02/14/15  2:10  PM.  Always use your most recent med list.               aspirin 81 MG tablet  Take 81 mg by mouth daily.     atorvastatin 20 MG tablet  Commonly known as:  LIPITOR  TAKE 1 TABLET (20 MG TOTAL) BY MOUTH DAILY.     azithromycin 250 MG tablet  Commonly known as:  ZITHROMAX  Take 2 the first day and then one each day after.     BRINTELLIX 20 MG Tabs  Generic drug:  Vortioxetine HBr  TAKE 1 TABLET EVERY DAY     clonazePAM 0.5 MG tablet  Commonly known as:  KLONOPIN  Take 1 tablet (0.5 mg total) by mouth 3 (three) times daily as needed.     fluticasone 50 MCG/ACT nasal spray  Commonly known as:  FLONASE  Place 1 spray into both nostrils 2 (two) times daily as needed for allergies or rhinitis.     losartan-hydrochlorothiazide 50-12.5 MG tablet  Commonly known as:  HYZAAR  Take 1 tablet by mouth daily.     nitroGLYCERIN 0.4 MG SL tablet  Commonly known as:  NITROSTAT  Place 1 tablet (0.4 mg total) under the tongue every 5 (five) minutes as needed for chest pain.     SYNTHROID 100 MCG tablet  Generic drug:  levothyroxine  Take 1 tablet (100 mcg total) by mouth daily.     valACYclovir 1000 MG tablet  Commonly known as:  VALTREX  Take 2 tablets (2,000 mg  total) by mouth 2 (two) times daily.     Vitamin D (Ergocalciferol) 50000 UNITS Caps capsule  Commonly known as:  DRISDOL  TAKE 1 CAPSULE (50,000 UNITS TOTAL) BY MOUTH EVERY 7 (SEVEN) DAYS.           Objective:    BP 110/65 mmHg  Pulse 81  Temp(Src) 97.3 F (36.3 C) (Oral)  Ht 5\' 3"  (1.6 m)  Wt 165 lb 6.4 oz (75.025 kg)  BMI 29.31 kg/m2  Wt Readings from Last 3 Encounters:  02/14/15 165 lb 6.4 oz (75.025 kg)  01/19/15 163 lb (73.936 kg)  07/20/14 161 lb (73.029 kg)    Physical Exam  Constitutional: She is oriented to person, place, and time. She appears well-developed and well-nourished. No distress.  HENT:  Right Ear: Tympanic membrane, external ear and ear canal normal.  Left Ear: Tympanic membrane,  external ear and ear canal normal.  Nose: Mucosal edema and rhinorrhea present. No epistaxis. Right sinus exhibits maxillary sinus tenderness. Right sinus exhibits no frontal sinus tenderness. Left sinus exhibits maxillary sinus tenderness. Left sinus exhibits no frontal sinus tenderness.  Mouth/Throat: Uvula is midline and mucous membranes are normal. Posterior oropharyngeal edema and posterior oropharyngeal erythema present. No oropharyngeal exudate or tonsillar abscesses.  Eyes: Conjunctivae and EOM are normal.  Cardiovascular: Normal rate, regular rhythm, normal heart sounds and intact distal pulses.   No murmur heard. Pulmonary/Chest: Effort normal and breath sounds normal. No respiratory distress. She has no wheezes.  Musculoskeletal: Normal range of motion. She exhibits no edema or tenderness.  Neurological: She is alert and oriented to person, place, and time. Coordination normal.  Skin: Skin is warm and dry. No rash noted. She is not diaphoretic.  Psychiatric: She has a normal mood and affect. Her behavior is normal.  Vitals reviewed.       Assessment & Plan:   Problem List Items Addressed This Visit    None    Visit Diagnoses    Laryngitis, acute    -  Primary    Patient has persistent laryngitis for the past 3 weeks and pharyngitis we'll give azithromycin    Relevant Medications    azithromycin (ZITHROMAX) 250 MG tablet    fluticasone (FLONASE) 50 MCG/ACT nasal spray        Follow up plan: Return if symptoms worsen or fail to improve.  Counseling provided for all of the vaccine components No orders of the defined types were placed in this encounter.    Caryl Pina, MD [facility] 02/14/2015, 2:10 PM

## 2015-07-21 ENCOUNTER — Ambulatory Visit: Payer: BC Managed Care – PPO | Admitting: Family Medicine

## 2015-08-08 ENCOUNTER — Ambulatory Visit (INDEPENDENT_AMBULATORY_CARE_PROVIDER_SITE_OTHER): Payer: BC Managed Care – PPO

## 2015-08-08 ENCOUNTER — Encounter: Payer: Self-pay | Admitting: Family Medicine

## 2015-08-08 ENCOUNTER — Ambulatory Visit (INDEPENDENT_AMBULATORY_CARE_PROVIDER_SITE_OTHER): Payer: BC Managed Care – PPO | Admitting: Family Medicine

## 2015-08-08 VITALS — BP 107/69 | HR 71 | Temp 97.5°F | Ht 63.0 in | Wt 163.0 lb

## 2015-08-08 DIAGNOSIS — E039 Hypothyroidism, unspecified: Secondary | ICD-10-CM | POA: Diagnosis not present

## 2015-08-08 DIAGNOSIS — E559 Vitamin D deficiency, unspecified: Secondary | ICD-10-CM

## 2015-08-08 DIAGNOSIS — M797 Fibromyalgia: Secondary | ICD-10-CM | POA: Diagnosis not present

## 2015-08-08 DIAGNOSIS — R002 Palpitations: Secondary | ICD-10-CM

## 2015-08-08 DIAGNOSIS — M25551 Pain in right hip: Secondary | ICD-10-CM

## 2015-08-08 DIAGNOSIS — F419 Anxiety disorder, unspecified: Secondary | ICD-10-CM

## 2015-08-08 DIAGNOSIS — I10 Essential (primary) hypertension: Secondary | ICD-10-CM | POA: Diagnosis not present

## 2015-08-08 DIAGNOSIS — M545 Low back pain: Secondary | ICD-10-CM

## 2015-08-08 NOTE — Addendum Note (Signed)
Addended by: Zannie Cove on: 08/08/2015 12:06 PM   Modules accepted: Orders, SmartSet

## 2015-08-08 NOTE — Patient Instructions (Addendum)
Continue current medications. Continue good therapeutic lifestyle changes which include good diet and exercise. Fall precautions discussed with patient. If an FOBT was given today- please return it to our front desk. If you are over 62 years old - you may need Prevnar 55 or the adult Pneumonia vaccine.   After your visit with [place] today you will receive a survey in the mail or online from [employer] regarding your care with [place]. Please take a moment to fill this out. Your feedback is very important to [place] as you can help [place] better understand your patient needs as well as improve your experience and satisfaction. WE CARE ABOUT YOU!!!   The patient should try ibuprofen 1 twice daily after breakfast and supper for at least 1 week as a trial being careful to watch her stomach and keep a close check on her blood pressure. We will schedule her to see the orthopedic surgeon that makes his visit here this month for possible injection. We will call with lab work results once these results become available. She should monitor blood pressures closely this summer and if the readings continue to run low and we'll consider keeping her on low Sartin only and discontinuing the HCTZ.

## 2015-08-08 NOTE — Progress Notes (Signed)
Subjective:    Patient ID: Julie [name], female    DOB: [birth year], 60 y.o.   MRN: [id number]  HPI Pt here for follow up and management of chronic medical problems which includes hypertension and hypothyroid. She is taking medications regularly.The patient complains of some arthralgias in her right hip. She fell on her right hip in August 2016 and she has had problems off and on with the hip and the low back since that fall. She tripped over her daughter. She continues to have ongoing problems with hip especially with certain movements. She also has problems with the knee on the same side. 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 She is excited about this. She denies any chest pain or shortness of breath. She only has occasional palpitations and they seem to be improving. She is intolerant of the atorvastatin and then it causes some problems with her throat and feels like her throat tightens up and she is tried it on several occasions. Stated she is no longer taking this. She also said it causes some itching issues. She cannot take  brintellex either. She only has occasional heartburn and indigestion and wonders sometimes she has problems with her gallbladder because certain foods seem to bother her more. She denies any blood in the stools or black tarry bowel movements but is scheduled for colonoscopy in July with her gastroenterologist. She has no trouble passing her water burning pain or frequency.   Patient Active Problem List   Diagnosis Date Noted  . Anxiety 07/03/2012  . Hypothyroidism 06/25/2012  . Anemia 11/03/2011  . Hypernatremia 11/03/2011  . Hyperchloremia 11/03/2011  . Palpitations 11/02/2011  . Chest pain 11/02/2011  . Hypertension   . Fibromyalgia   . Thyroid disease    Outpatient Encounter Prescriptions as of 08/08/2015  Medication Sig  . aspirin 81 MG tablet Take 81 mg by mouth daily.  . clonazePAM (KLONOPIN) 0.5 MG tablet Take 1 tablet (0.5  mg total) by mouth 3 (three) times daily as needed.  Marland Kitchen losartan-hydrochlorothiazide (HYZAAR) 50-12.5 MG per tablet Take 1 tablet by mouth daily.  Marland Kitchen SYNTHROID 100 MCG tablet Take 1 tablet (100 mcg total) by mouth daily.  . valACYclovir (VALTREX) 1000 MG tablet Take 2 tablets (2,000 mg total) by mouth 2 (two) times daily. (Patient taking differently: Take 2,000 mg by mouth. As needed)  . Vitamin D, Ergocalciferol, (DRISDOL) 50000 UNITS CAPS capsule TAKE 1 CAPSULE (50,000 UNITS TOTAL) BY MOUTH EVERY 7 (SEVEN) DAYS.  Marland Kitchen nitroGLYCERIN (NITROSTAT) 0.4 MG SL tablet Place 1 tablet (0.4 mg total) under the tongue every 5 (five) minutes as needed for chest pain. (Patient not taking: Reported on 01/19/2015)  . [DISCONTINUED] atorvastatin (LIPITOR) 20 MG tablet TAKE 1 TABLET (20 MG TOTAL) BY MOUTH DAILY.  . [DISCONTINUED] azithromycin (ZITHROMAX) 250 MG tablet Take 2 the first day and then one each day after.  . [DISCONTINUED] BRINTELLIX 20 MG TABS TAKE 1 TABLET EVERY DAY  . [DISCONTINUED] fluticasone (FLONASE) 50 MCG/ACT nasal spray Place 1 spray into both nostrils 2 (two) times daily as needed for allergies or rhinitis.   No facility-administered encounter medications on file as of 08/08/2015.      Review of Systems  Constitutional: Negative.   HENT: Negative.   Eyes: Negative.   Respiratory: Negative.   Cardiovascular: Negative.   Gastrointestinal: Negative.   Endocrine: Negative.   Genitourinary: Negative.   Musculoskeletal: Positive for arthralgias (right hip pain).  Skin: Negative.  Allergic/Immunologic: Negative.   Neurological: Negative.   Hematological: Negative.   Psychiatric/Behavioral: Negative.        Objective:   Physical Exam  Constitutional: She is oriented to person, place, and time. She appears well-developed and well-nourished. No distress.  Pleasant and alert and excited to be retiring  HENT:  Head: Normocephalic and atraumatic.  Right Ear: External ear normal.  Left  Ear: External ear normal.  Nose: Nose normal.  Mouth/Throat: Oropharynx is clear and moist.  Eyes: Conjunctivae and EOM are normal. Pupils are equal, round, and reactive to light. Right eye exhibits no discharge. Left eye exhibits no discharge. No scleral icterus.  Neck: Normal range of motion. Neck supple. No thyromegaly present.  No bruits or thyromegaly  Cardiovascular: Normal rate, regular rhythm, normal heart sounds and intact distal pulses.   No murmur heard. Heart has a regular rate and rhythm at 72/m  Pulmonary/Chest: Effort normal and breath sounds normal. No respiratory distress. She has no wheezes. She has no rales. She exhibits no tenderness.  Clear anteriorly and posteriorly  Abdominal: Soft. Bowel sounds are normal. She exhibits no mass. There is no tenderness. There is no rebound and no guarding.  No abdominal tenderness masses or organ enlargement or bruits  Musculoskeletal: Normal range of motion. She exhibits no edema or tenderness.  Some discomfort in the right hip and low back with abduction of the hip. Some discomfort with leg raising against pressure on the right side. Most of the pain is in the knee with doing this.  Lymphadenopathy:    She has no cervical adenopathy.  Neurological: She is alert and oriented to person, place, and time. She has normal reflexes. No cranial nerve deficit.  Skin: Skin is warm and dry. No rash noted.  Psychiatric: She has a normal mood and affect. Her behavior is normal. Judgment and thought content normal.  Nursing note and vitals reviewed.   BP 107/69 mmHg  Pulse 71  Temp(Src) 97.5 F (36.4 C) (Oral)  Ht 5' 3" (1.6 m)  Wt 163 lb (73.936 kg)  BMI 28.88 kg/m2  EKG: Within normal limits  [facility] reading (PRIMARY) by  Dr. Bobbe Medico spine and right hip with results pending                                      Assessment & Plan:  1. Essential hypertension -Continue with current treatment pending results of lab work -Review blood  pressures at the end of the summer and if they continue to run low after retiring we may consider discontinuing the HCTZ - BMP8+EGFR - CBC with Differential/Platelet - Hepatic function panel - Lipid panel - EKG 12-Lead  2. Hypothyroidism, unspecified hypothyroidism type -Continue current treatment pending results of lab work - CBC with Differential/Platelet - Thyroid Panel With TSH  3. Vitamin D deficiency -Continue vitamin D replacement pending results of lab work - CBC with Differential/Platelet - VITAMIN D 25 Hydroxy (Vit-D Deficiency, Fractures) - Fecal occult blood, imunochemical; Future  4. Fibromyalgia The patient is apparently doing well with this with no complaints. CBC with Differential/Platelet  5. Anxiety -She only takes the clonazepam if needed for panic attack. She has not had a fist often at all.  6. Palpitations -Palpitations seem to be more stable and happening less often.  Patient Instructions  Continue current medications. Continue good therapeutic lifestyle changes which include good diet and exercise. Fall precautions discussed  with patient. If an FOBT was given today- please return it to our front desk. If you are over 18 years old - you may need Prevnar 58 or the adult Pneumonia vaccine.   After your visit with [place] today you will receive a survey in the mail or online from [employer] regarding your care with [place]. Please take a moment to fill this out. Your feedback is very important to [place] as you can help [place] better understand your patient needs as well as improve your experience and satisfaction. WE CARE ABOUT YOU!!!   The patient should try ibuprofen 1 twice daily after breakfast and supper for at least 1 week as a trial being careful to watch her stomach and keep a close check on her blood pressure. We will schedule her to see the orthopedic surgeon that makes his visit here this month for possible injection. We will call with lab work results once these  results become available. She should monitor blood pressures closely this summer and if the readings continue to run low and we'll consider keeping her on low Sartin only and discontinuing the HCTZ.   Arrie Senate MD

## 2015-08-09 ENCOUNTER — Telehealth: Payer: Self-pay | Admitting: Family Medicine

## 2015-08-09 LAB — THYROID PANEL WITH TSH
Free Thyroxine Index: 2.6 (ref 1.2–4.9)
T3 UPTAKE RATIO: 29 % (ref 24–39)
T4 TOTAL: 8.9 ug/dL (ref 4.5–12.0)
TSH: 2.7 u[IU]/mL (ref 0.450–4.500)

## 2015-08-09 LAB — CBC WITH DIFFERENTIAL/PLATELET
BASOS ABS: 0 10*3/uL (ref 0.0–0.2)
Basos: 0 %
EOS (ABSOLUTE): 0.1 10*3/uL (ref 0.0–0.4)
EOS: 2 %
HEMATOCRIT: 38.9 % (ref 34.0–46.6)
HEMOGLOBIN: 13.2 g/dL (ref 11.1–15.9)
IMMATURE GRANS (ABS): 0 10*3/uL (ref 0.0–0.1)
Immature Granulocytes: 0 %
LYMPHS: 29 %
Lymphocytes Absolute: 1.5 10*3/uL (ref 0.7–3.1)
MCH: 28.2 pg (ref 26.6–33.0)
MCHC: 33.9 g/dL (ref 31.5–35.7)
MCV: 83 fL (ref 79–97)
MONOCYTES: 7 %
Monocytes Absolute: 0.4 10*3/uL (ref 0.1–0.9)
NEUTROS ABS: 3.2 10*3/uL (ref 1.4–7.0)
Neutrophils: 62 %
Platelets: 270 10*3/uL (ref 150–379)
RBC: 4.68 x10E6/uL (ref 3.77–5.28)
RDW: 14 % (ref 12.3–15.4)
WBC: 5.1 10*3/uL (ref 3.4–10.8)

## 2015-08-09 LAB — LIPID PANEL
CHOL/HDL RATIO: 4.9 ratio — AB (ref 0.0–4.4)
CHOLESTEROL TOTAL: 214 mg/dL — AB (ref 100–199)
HDL: 44 mg/dL (ref >39)
LDL Calculated: 143 mg/dL — ABNORMAL HIGH (ref 0–99)
TRIGLYCERIDES: 134 mg/dL (ref 0–149)
VLDL Cholesterol Cal: 27 mg/dL (ref 5–40)

## 2015-08-09 LAB — HEPATIC FUNCTION PANEL
ALBUMIN: 4.4 g/dL (ref 3.6–4.8)
ALK PHOS: 72 IU/L (ref 39–117)
ALT: 14 IU/L (ref 0–32)
AST: 12 IU/L (ref 0–40)
Bilirubin Total: 0.4 mg/dL (ref 0.0–1.2)
Bilirubin, Direct: 0.1 mg/dL (ref 0.00–0.40)
Total Protein: 6.9 g/dL (ref 6.0–8.5)

## 2015-08-09 LAB — BMP8+EGFR
BUN/Creatinine Ratio: 16 (ref 12–28)
BUN: 15 mg/dL (ref 8–27)
CALCIUM: 9.4 mg/dL (ref 8.7–10.3)
CHLORIDE: 99 mmol/L (ref 96–106)
CO2: 23 mmol/L (ref 18–29)
CREATININE: 0.92 mg/dL (ref 0.57–1.00)
GFR calc non Af Amer: 67 mL/min/{1.73_m2} (ref >59)
GFR, EST AFRICAN AMERICAN: 77 mL/min/{1.73_m2} (ref >59)
Glucose: 85 mg/dL (ref 65–99)
Potassium: 3.7 mmol/L (ref 3.5–5.2)
Sodium: 142 mmol/L (ref 134–144)

## 2015-08-09 LAB — VITAMIN D 25 HYDROXY (VIT D DEFICIENCY, FRACTURES): Vit D, 25-Hydroxy: 41.3 ng/mL (ref 30.0–100.0)

## 2015-08-13 ENCOUNTER — Other Ambulatory Visit: Payer: Self-pay | Admitting: Family Medicine

## 2015-08-16 ENCOUNTER — Ambulatory Visit (INDEPENDENT_AMBULATORY_CARE_PROVIDER_SITE_OTHER): Payer: BC Managed Care – PPO | Admitting: Family Medicine

## 2015-08-16 ENCOUNTER — Encounter: Payer: Self-pay | Admitting: Family Medicine

## 2015-08-16 VITALS — BP 147/89 | HR 88 | Temp 97.7°F | Ht 63.0 in | Wt 164.4 lb

## 2015-08-16 DIAGNOSIS — J029 Acute pharyngitis, unspecified: Secondary | ICD-10-CM | POA: Diagnosis not present

## 2015-08-16 DIAGNOSIS — J209 Acute bronchitis, unspecified: Secondary | ICD-10-CM | POA: Diagnosis not present

## 2015-08-16 MED ORDER — AZITHROMYCIN 250 MG PO TABS: ORAL_TABLET | ORAL | Status: DC

## 2015-08-16 NOTE — Progress Notes (Signed)
   HPI  Patient presents today here with sore throat.  Patient's lines over the last 3 days she's had cough productive of thick yellow sputum, congestion, sore throat, and chest tightness with mild dyspnea.  She's tolerating food and fluids normally.  She works at a [employer] school and has several sick contacts.  Her granddaughter has a similar illness and is treated for an ear infection. She denies chest pain but does have chest tightness with deep inspiration. She feels mildly dyspneic  PMH: Smoking status noted ROS: Per HPI  Objective: BP 147/89 mmHg  Pulse 88  Temp(Src) 97.7 F (36.5 C) (Oral)  Ht 5\' 3"  (1.6 m)  Wt 164 lb 6.4 oz (74.571 kg)  BMI 29.13 kg/m2 Gen: NAD, alert, cooperative with exam HEENT: NCAT, nares clear, oropharynx with erythematous tonsils and some white exudate, TMs normal bilaterally CV: RRR, good S1/S2, no murmur Resp: CTABL, no wheezes, non-labored Ext: No edema, warm Neuro: Alert and oriented, No gross deficits  Assessment and plan:  # Acute bronchitis Symptoms convincing for clinical strep as well, I sent a strep culture.  Has been very, and the creatinine recently and she is elementary school principal. She also has thick productive cough and dyspnea, her lung exam is reassuring. Azithromycin would cover both developing pneumonia and strep Discussed supportive care Return to clinic with any concerns, continued, or worsening symptoms.     Orders Placed This Encounter  Procedures  . Rapid strep screen (not at [facility])  . Culture, Group A Strep    Order Specific Question:  Source    Answer:  throat    Meds ordered this encounter  Medications  . azithromycin (ZITHROMAX) 250 MG tablet    Sig: Take 2 tablets on day 1 and 1 tablet daily after that    Dispense:  6 tablet    Refill:  0    Laroy Apple, MD [facility] 08/16/2015, 4:12 PM

## 2015-08-16 NOTE — Patient Instructions (Signed)
Great to meet you!  I am treating you for bronchitis, the treatment would also take care of strep or pneumonia  Acute Bronchitis Bronchitis is when the airways that extend from the windpipe into the lungs get red, puffy, and painful (inflamed). Bronchitis often causes thick spit (mucus) to develop. This leads to a cough. A cough is the most common symptom of bronchitis. In acute bronchitis, the condition usually begins suddenly and goes away over time (usually in 2 weeks). Smoking, allergies, and asthma can make bronchitis worse. Repeated episodes of bronchitis may cause more lung problems. HOME CARE  Rest.  Drink enough fluids to keep your pee (urine) clear or pale yellow (unless you need to limit fluids as told by your doctor).  Only take over-the-counter or prescription medicines as told by your doctor.  Avoid smoking and secondhand smoke. These can make bronchitis worse. If you are a smoker, think about using nicotine gum or skin patches. Quitting smoking will help your lungs heal faster.  Reduce the chance of getting bronchitis again by:  Washing your hands often.  Avoiding people with cold symptoms.  Trying not to touch your hands to your mouth, nose, or eyes.  Follow up with your doctor as told. GET HELP IF: Your symptoms do not improve after 1 week of treatment. Symptoms include:  Cough.  Fever.  Coughing up thick spit.  Body aches.  Chest congestion.  Chills.  Shortness of breath.  Sore throat. GET HELP RIGHT AWAY IF:   You have an increased fever.  You have chills.  You have severe shortness of breath.  You have bloody thick spit (sputum).  You throw up (vomit) often.  You lose too much body fluid (dehydration).  You have a severe headache.  You faint. MAKE SURE YOU:   Understand these instructions.  Will watch your condition.  Will get help right away if you are not doing well or get worse.   This information is not intended to replace  advice given to you by your health care provider. Make sure you discuss any questions you have with your health care provider.   Document Released: 08/08/2007 Document Revised: 10/22/2012 Document Reviewed: 08/12/2012 Elsevier Interactive Patient Education [employer].

## 2015-08-18 LAB — CULTURE, GROUP A STREP: STREP A CULTURE: NEGATIVE

## 2015-08-18 LAB — RAPID STREP SCREEN (MED CTR MEBANE ONLY): Strep Gp A Ag, IA W/Reflex: NEGATIVE

## 2015-12-27 ENCOUNTER — Other Ambulatory Visit: Payer: Self-pay | Admitting: Family Medicine

## 2016-01-04 ENCOUNTER — Other Ambulatory Visit: Payer: Self-pay | Admitting: Family Medicine

## 2016-01-17 ENCOUNTER — Other Ambulatory Visit: Payer: Self-pay | Admitting: Gastroenterology

## 2016-01-17 DIAGNOSIS — R110 Nausea: Secondary | ICD-10-CM

## 2016-01-17 DIAGNOSIS — R1013 Epigastric pain: Secondary | ICD-10-CM

## 2016-01-19 ENCOUNTER — Ambulatory Visit (HOSPITAL_COMMUNITY)
Admission: RE | Admit: 2016-01-19 | Discharge: 2016-01-19 | Disposition: A | Discharge status: Home / Self Care | Payer: BC Managed Care – PPO | Source: Ambulatory Visit | Attending: Gastroenterology | Admitting: Gastroenterology

## 2016-01-19 DIAGNOSIS — R110 Nausea: Secondary | ICD-10-CM | POA: Diagnosis present

## 2016-01-19 DIAGNOSIS — R1013 Epigastric pain: Secondary | ICD-10-CM | POA: Insufficient documentation

## 2016-01-20 ENCOUNTER — Ambulatory Visit (HOSPITAL_COMMUNITY)
Admission: RE | Admit: 2016-01-20 | Discharge: 2016-01-20 | Disposition: A | Discharge status: Home / Self Care | Payer: BC Managed Care – PPO | Source: Ambulatory Visit | Attending: Gastroenterology | Admitting: Gastroenterology

## 2016-01-20 DIAGNOSIS — R1013 Epigastric pain: Secondary | ICD-10-CM | POA: Insufficient documentation

## 2016-01-20 DIAGNOSIS — R110 Nausea: Secondary | ICD-10-CM | POA: Diagnosis present

## 2016-01-20 MED ORDER — TECHNETIUM TC 99M MEBROFENIN IV KIT
5.5000 | PACK | Freq: Once | INTRAVENOUS | Status: AC | PRN
  Administered 2016-01-20: 5.5 via INTRAVENOUS

## 2016-01-20 MED ORDER — SINCALIDE 5 MCG IJ SOLR
0.0200 ug/kg | Freq: Once | INTRAMUSCULAR | Status: AC
  Administered 2016-01-20: 1.5 ug via INTRAVENOUS

## 2016-02-07 ENCOUNTER — Ambulatory Visit (INDEPENDENT_AMBULATORY_CARE_PROVIDER_SITE_OTHER): Payer: BC Managed Care – PPO | Admitting: Family Medicine

## 2016-02-07 ENCOUNTER — Encounter: Payer: Self-pay | Admitting: Family Medicine

## 2016-02-07 VITALS — BP 114/73 | HR 75 | Temp 97.0°F | Ht 63.0 in | Wt 163.0 lb

## 2016-02-07 DIAGNOSIS — E559 Vitamin D deficiency, unspecified: Secondary | ICD-10-CM | POA: Diagnosis not present

## 2016-02-07 DIAGNOSIS — M797 Fibromyalgia: Secondary | ICD-10-CM

## 2016-02-07 DIAGNOSIS — I10 Essential (primary) hypertension: Secondary | ICD-10-CM | POA: Diagnosis not present

## 2016-02-07 DIAGNOSIS — H9201 Otalgia, right ear: Secondary | ICD-10-CM | POA: Diagnosis not present

## 2016-02-07 DIAGNOSIS — R300 Dysuria: Secondary | ICD-10-CM

## 2016-02-07 DIAGNOSIS — R350 Frequency of micturition: Secondary | ICD-10-CM | POA: Diagnosis not present

## 2016-02-07 DIAGNOSIS — R1013 Epigastric pain: Secondary | ICD-10-CM

## 2016-02-07 DIAGNOSIS — E039 Hypothyroidism, unspecified: Secondary | ICD-10-CM | POA: Diagnosis not present

## 2016-02-07 LAB — URINALYSIS, COMPLETE
BILIRUBIN UA: NEGATIVE
Glucose, UA: NEGATIVE
Ketones, UA: NEGATIVE
LEUKOCYTES UA: NEGATIVE
Nitrite, UA: NEGATIVE
PH UA: 5.5 (ref 5.0–7.5)
PROTEIN UA: NEGATIVE
RBC, UA: NEGATIVE
Specific Gravity, UA: 1.015 (ref 1.005–1.030)
Urobilinogen, Ur: 0.2 mg/dL (ref 0.2–1.0)

## 2016-02-07 LAB — MICROSCOPIC EXAMINATION

## 2016-02-07 MED ORDER — NITROGLYCERIN 0.4 MG SL SUBL: 0.4000 mg | SUBLINGUAL_TABLET | SUBLINGUAL | 3 refills | Status: DC | PRN

## 2016-02-07 MED ORDER — LOSARTAN POTASSIUM-HCTZ 50-12.5 MG PO TABS: 1.0000 | ORAL_TABLET | Freq: Every day | ORAL | 3 refills | Status: DC

## 2016-02-07 NOTE — Patient Instructions (Addendum)
Continue current medications. Continue good therapeutic lifestyle changes which include good diet and exercise. Fall precautions discussed with patient. If an FOBT was given today- please return it to our front desk. If you are over 62 years old - you may need Prevnar 45 or the adult Pneumonia vaccine.  **Flu shots are available--- please call and schedule a FLU-CLINIC appointment**  After your visit with [place] today you will receive a survey in the mail or online from [employer] regarding your care with [place]. Please take a moment to fill this out. Your feedback is very important to [place] as you can help [place] better understand your patient needs as well as improve your experience and satisfaction. WE CARE ABOUT YOU!!!   Use nasal saline as directed Use Flonase 1 spray each nostril at bedtime Drink plenty of fluids and stay well hydrated Keep the house as cool as possible Follow-up with gastroenterologist as planned Consider taking a ranitidine or Zantac before dinner at bedtime if he stay on a proton pump inhibitor, but talk to the gastroenterologist first

## 2016-02-07 NOTE — Addendum Note (Signed)
Addended by: Zannie Cove on: 02/07/2016 02:10 PM   Modules accepted: Orders

## 2016-02-07 NOTE — Progress Notes (Signed)
Subjective:    Patient ID: Julie [name], female    DOB: [birth year], 60 y.o.   MRN: [id number]  HPI Pt here for follow up and management of chronic medical problems which includes hypothyroid and hypertension. She is taking medications regularly.She has recently had some trouble with her stomach and has had colonoscopy and endoscopy and has a scheduled visit to return to the gastroenterologist. She also complains of some right ear pain and urinary frequency and pain. She is asking for refills on low [place]. She will get lab work today and has plans to schedule her mammogram. The right ear pain has been going on for about a week and a half and she did have some discharge from this year and is uncomfortable to move the year and a little bit sore down below the angle of the jaw. She is also has some frequency and dysuria that have them happening for about the last week it is a little better but she is still uncomfortable with this. She does have a history of kidney stones. She still is also having some discomfort with her upper stomach. The results of the colonoscopy was that she had 3 polyps and it did show also with her endoscopy that she has some duodenitis. She has a follow-up visit scheduled in a couple of days with the gastroenterologist. She denies any chest pain pressure or palpitations. She's not had any significant problems with her thyroid that she is aware of. She denies any shortness of breath. Other than the gas and bloating and 2 bowel movements a day and no blood in the stool she is doing okay with her stomach.    Patient Active Problem List   Diagnosis Date Noted  . Anxiety 07/03/2012  . Hypothyroidism 06/25/2012  . Anemia 11/03/2011  . Hypernatremia 11/03/2011  . Hyperchloremia 11/03/2011  . Palpitations 11/02/2011  . Chest pain 11/02/2011  . Hypertension   . Fibromyalgia    Outpatient Encounter Prescriptions as of 02/07/2016  Medication Sig  . aspirin 81 MG tablet Take 81 mg  by mouth daily.  . clonazePAM (KLONOPIN) 0.5 MG tablet Take 1 tablet (0.5 mg total) by mouth 3 (three) times daily as needed.  Marland Kitchen losartan-hydrochlorothiazide (HYZAAR) 50-12.5 MG tablet Take 1 tablet by mouth daily.  Marland Kitchen SYNTHROID 100 MCG tablet TAKE 1 TABLET (100 MCG TOTAL) BY MOUTH DAILY.  . valACYclovir (VALTREX) 1000 MG tablet Take 2 tablets (2,000 mg total) by mouth 2 (two) times daily. (Patient taking differently: Take 2,000 mg by mouth. As needed)  . Vitamin D, Ergocalciferol, (DRISDOL) 50000 units CAPS capsule TAKE 1 CAPSULE (50,000 UNITS TOTAL) BY MOUTH EVERY 7 (SEVEN) DAYS.  . [DISCONTINUED] losartan-hydrochlorothiazide (HYZAAR) 50-12.5 MG tablet TAKE 1 TABLET BY MOUTH DAILY.  . nitroGLYCERIN (NITROSTAT) 0.4 MG SL tablet Place 1 tablet (0.4 mg total) under the tongue every 5 (five) minutes as needed for chest pain.  . [DISCONTINUED] azithromycin (ZITHROMAX) 250 MG tablet Take 2 tablets on day 1 and 1 tablet daily after that  . [DISCONTINUED] escitalopram (LEXAPRO) 10 MG tablet TAKE 1 TABLET BY MOUTH EVERY DAY  . [DISCONTINUED] nitroGLYCERIN (NITROSTAT) 0.4 MG SL tablet Place 1 tablet (0.4 mg total) under the tongue every 5 (five) minutes as needed for chest pain. (Patient not taking: Reported on 01/19/2015)   No facility-administered encounter medications on file as of 02/07/2016.       Review of Systems  Constitutional: Negative.   HENT: Positive for ear pain (right ).  Eyes: Negative.   Respiratory: Negative.   Cardiovascular: Negative.   Gastrointestinal: Negative.   Endocrine: Negative.   Genitourinary: Positive for dysuria and frequency.  Musculoskeletal: Negative.   Skin: Negative.   Allergic/Immunologic: Negative.   Neurological: Negative.   Hematological: Negative.   Psychiatric/Behavioral: Negative.        Objective:   Physical Exam  Constitutional: She is oriented to person, place, and time. She appears well-developed and well-nourished. No distress.  HENT:    Head: Normocephalic and atraumatic.  Right Ear: External ear normal.  Left Ear: External ear normal.  Nose: Nose normal.  Mouth/Throat: Oropharynx is clear and moist. No oropharyngeal exudate.  Both TMs appear normal. No redness no drainage.  Eyes: Conjunctivae and EOM are normal. Pupils are equal, round, and reactive to light. Right eye exhibits no discharge. Left eye exhibits no discharge. No scleral icterus.  Neck: Normal range of motion. Neck supple. No thyromegaly present.  No bruits thyromegaly or anterior cervical adenopathy no salivary gland swelling. No sinus tenderness or pain with palpation.  Cardiovascular: Normal rate, regular rhythm, normal heart sounds and intact distal pulses.   No murmur heard. Heart is regular at 72/m  Pulmonary/Chest: Effort normal and breath sounds normal. No respiratory distress. She has no wheezes. She has no rales.  Clear anteriorly and posteriorly  Abdominal: Soft. Bowel sounds are normal. She exhibits no mass. There is no tenderness. There is no rebound and no guarding.  Slight left upper quadrant tenderness  Musculoskeletal: Normal range of motion. She exhibits no edema.  Lymphadenopathy:    She has no cervical adenopathy.  Neurological: She is alert and oriented to person, place, and time. She has normal reflexes. No cranial nerve deficit.  Skin: Skin is warm and dry. No rash noted.  Psychiatric: She has a normal mood and affect. Her behavior is normal. Judgment and thought content normal.  Nursing note and vitals reviewed.   BP 114/73 (BP Location: Left Arm)   Pulse 75   Temp 97 F (36.1 C) (Oral)   Ht 5' 3"  (1.6 m)   Wt 163 lb (73.9 kg)   BMI 28.87 kg/m        Assessment & Plan:  1. Essential hypertension -The blood pressure is good today and she will continue with current treatment - BMP8+EGFR - CBC with Differential/Platelet - Hepatic function panel - NMR, lipoprofile  2. Vitamin D deficiency -Continue with current  treatment pending results of lab work - CBC with Differential/Platelet - VITAMIN D 25 Hydroxy (Vit-D Deficiency, Fractures)  3. Fibromyalgia -There were no significant complaints with her fibromyalgia and she will continue with her current treatment regimen - CBC with Differential/Platelet  4. Hypothyroidism, unspecified type -No palpitations no fatigue or tiredness. And 10 you with current treatment pending results of lab work - Thyroid Panel With TSH  5. Frequent urination - Urinalysis, Complete  6. Dysuria - Urinalysis, Complete  7. Abdominal pain, epigastric -Follow-up with gastroenterology as planned  8. Right ear pain -Conservative efforts at first with Flonase nasal saline and if no improvement within 10 days or so call back and we will consider trying an antibiotic.  Meds ordered this encounter  Medications  . losartan-hydrochlorothiazide (HYZAAR) 50-12.5 MG tablet    Sig: Take 1 tablet by mouth daily.    Dispense:  90 tablet    Refill:  3  . nitroGLYCERIN (NITROSTAT) 0.4 MG SL tablet    Sig: Place 1 tablet (0.4 mg total) under the tongue every 5 (  five) minutes as needed for chest pain.    Dispense:  20 tablet    Refill:  3   Patient Instructions  Continue current medications. Continue good therapeutic lifestyle changes which include good diet and exercise. Fall precautions discussed with patient. If an FOBT was given today- please return it to our front desk. If you are over 9 years old - you may need Prevnar 59 or the adult Pneumonia vaccine.  **Flu shots are available--- please call and schedule a FLU-CLINIC appointment**  After your visit with [place] today you will receive a survey in the mail or online from [employer] regarding your care with [place]. Please take a moment to fill this out. Your feedback is very important to [place] as you can help [place] better understand your patient needs as well as improve your experience and satisfaction. WE CARE ABOUT YOU!!!   Use  nasal saline as directed Use Flonase 1 spray each nostril at bedtime Drink plenty of fluids and stay well hydrated Keep the house as cool as possible Follow-up with gastroenterologist as planned Consider taking a ranitidine or Zantac before dinner at bedtime if he stay on a proton pump inhibitor, but talk to the gastroenterologist first  Arrie Senate MD

## 2016-02-08 ENCOUNTER — Telehealth: Payer: Self-pay | Admitting: *Deleted

## 2016-02-08 ENCOUNTER — Other Ambulatory Visit: Payer: Self-pay | Admitting: Family Medicine

## 2016-02-08 LAB — HEPATIC FUNCTION PANEL
ALT: 14 IU/L (ref 0–32)
AST: 19 IU/L (ref 0–40)
Albumin: 4.3 g/dL (ref 3.6–4.8)
Alkaline Phosphatase: 71 IU/L (ref 39–117)
BILIRUBIN TOTAL: 0.4 mg/dL (ref 0.0–1.2)
Bilirubin, Direct: 0.11 mg/dL (ref 0.00–0.40)
Total Protein: 6.2 g/dL (ref 6.0–8.5)

## 2016-02-08 LAB — CBC WITH DIFFERENTIAL/PLATELET
Basophils Absolute: 0 10*3/uL (ref 0.0–0.2)
Basos: 1 %
EOS (ABSOLUTE): 0.1 10*3/uL (ref 0.0–0.4)
EOS: 3 %
Hematocrit: 37.5 % (ref 34.0–46.6)
Hemoglobin: 12.9 g/dL (ref 11.1–15.9)
IMMATURE GRANULOCYTES: 0 %
Immature Grans (Abs): 0 10*3/uL (ref 0.0–0.1)
Lymphocytes Absolute: 1.2 10*3/uL (ref 0.7–3.1)
Lymphs: 31 %
MCH: 29.2 pg (ref 26.6–33.0)
MCHC: 34.4 g/dL (ref 31.5–35.7)
MCV: 85 fL (ref 79–97)
MONOCYTES: 6 %
MONOS ABS: 0.2 10*3/uL (ref 0.1–0.9)
NEUTROS PCT: 59 %
Neutrophils Absolute: 2.3 10*3/uL (ref 1.4–7.0)
PLATELETS: 262 10*3/uL (ref 150–379)
RBC: 4.42 x10E6/uL (ref 3.77–5.28)
RDW: 14.2 % (ref 12.3–15.4)
WBC: 4 10*3/uL (ref 3.4–10.8)

## 2016-02-08 LAB — BMP8+EGFR
BUN/Creatinine Ratio: 17 (ref 12–28)
BUN: 18 mg/dL (ref 8–27)
CALCIUM: 9.2 mg/dL (ref 8.7–10.3)
CO2: 24 mmol/L (ref 18–29)
CREATININE: 1.06 mg/dL — AB (ref 0.57–1.00)
Chloride: 100 mmol/L (ref 96–106)
GFR calc Af Amer: 65 mL/min/{1.73_m2} (ref >59)
GFR calc non Af Amer: 56 mL/min/{1.73_m2} — ABNORMAL LOW (ref >59)
GLUCOSE: 83 mg/dL (ref 65–99)
Potassium: 4 mmol/L (ref 3.5–5.2)
SODIUM: 141 mmol/L (ref 134–144)

## 2016-02-08 LAB — NMR, LIPOPROFILE
Cholesterol: 200 mg/dL — ABNORMAL HIGH (ref 100–199)
HDL Cholesterol by NMR: 44 mg/dL (ref >39)
HDL PARTICLE NUMBER: 24.7 umol/L — AB (ref >=30.5)
LDL Particle Number: 1513 nmol/L — ABNORMAL HIGH (ref <1000)
LDL Size: 21 nm (ref >20.5)
LDL-C: 137 mg/dL — ABNORMAL HIGH (ref 0–99)
LP-IR SCORE: 43 (ref <=45)
SMALL LDL PARTICLE NUMBER: 566 nmol/L — AB (ref <=527)
Triglycerides by NMR: 95 mg/dL (ref 0–149)

## 2016-02-08 LAB — THYROID PANEL WITH TSH
FREE THYROXINE INDEX: 1.7 (ref 1.2–4.9)
T3 UPTAKE RATIO: 25 % (ref 24–39)
T4 TOTAL: 6.9 ug/dL (ref 4.5–12.0)
TSH: 2.73 u[IU]/mL (ref 0.450–4.500)

## 2016-02-08 LAB — VITAMIN D 25 HYDROXY (VIT D DEFICIENCY, FRACTURES): Vit D, 25-Hydroxy: 51.1 ng/mL (ref 30.0–100.0)

## 2016-02-08 MED ORDER — CIPROFLOXACIN HCL 500 MG PO TABS: 500.0000 mg | ORAL_TABLET | Freq: Two times a day (BID) | ORAL | 0 refills | Status: DC

## 2016-02-08 NOTE — Telephone Encounter (Signed)
Pt notified of RX Okayed per Dr Moore 

## 2016-02-08 NOTE — Telephone Encounter (Signed)
Pt called in with c/o worsening dysuria and frequency urine culture is pending Please advise

## 2016-02-08 NOTE — Telephone Encounter (Signed)
Go ahead and start Cipro 500 twice daily with food #14 and when sensitivities are returned we can change antibiotic if necessary

## 2016-02-09 LAB — URINE CULTURE

## 2016-02-21 ENCOUNTER — Other Ambulatory Visit: Payer: BC Managed Care – PPO

## 2016-02-21 DIAGNOSIS — Z87440 Personal history of urinary (tract) infections: Secondary | ICD-10-CM

## 2016-02-24 LAB — MICROSCOPIC EXAMINATION
BACTERIA UA: NONE SEEN
RENAL EPITHEL UA: NONE SEEN /HPF

## 2016-02-24 LAB — URINALYSIS, COMPLETE
Bilirubin, UA: NEGATIVE
GLUCOSE, UA: NEGATIVE
Ketones, UA: NEGATIVE
Leukocytes, UA: NEGATIVE
Nitrite, UA: NEGATIVE
PH UA: 5.5 (ref 5.0–7.5)
Protein, UA: NEGATIVE
RBC, UA: NEGATIVE
SPEC GRAV UA: 1.025 (ref 1.005–1.030)
Urobilinogen, Ur: 0.2 mg/dL (ref 0.2–1.0)

## 2016-03-07 ENCOUNTER — Other Ambulatory Visit: Payer: Self-pay | Admitting: Surgery

## 2016-03-07 ENCOUNTER — Other Ambulatory Visit: Payer: Self-pay | Admitting: Family Medicine

## 2016-03-07 ENCOUNTER — Ambulatory Visit
Admission: RE | Admit: 2016-03-07 | Discharge: 2016-03-07 | Disposition: A | Discharge status: Home / Self Care | Payer: BC Managed Care – PPO | Source: Ambulatory Visit | Attending: Family Medicine | Admitting: Family Medicine

## 2016-03-07 DIAGNOSIS — Z1231 Encounter for screening mammogram for malignant neoplasm of breast: Secondary | ICD-10-CM

## 2016-03-20 ENCOUNTER — Telehealth: Payer: Self-pay | Admitting: Family Medicine

## 2016-03-23 NOTE — Telephone Encounter (Signed)
Notes faxed today

## 2016-03-26 ENCOUNTER — Other Ambulatory Visit: Payer: Self-pay | Admitting: Surgery

## 2016-03-26 ENCOUNTER — Other Ambulatory Visit: Payer: Self-pay | Admitting: Family Medicine

## 2016-06-22 ENCOUNTER — Other Ambulatory Visit: Payer: Self-pay | Admitting: Family Medicine

## 2016-08-04 ENCOUNTER — Other Ambulatory Visit: Payer: Self-pay | Admitting: Family Medicine

## 2016-08-08 ENCOUNTER — Ambulatory Visit: Payer: BC Managed Care – PPO | Admitting: Family Medicine

## 2016-08-13 ENCOUNTER — Ambulatory Visit (INDEPENDENT_AMBULATORY_CARE_PROVIDER_SITE_OTHER): Payer: BC Managed Care – PPO

## 2016-08-13 ENCOUNTER — Ambulatory Visit (INDEPENDENT_AMBULATORY_CARE_PROVIDER_SITE_OTHER): Payer: BC Managed Care – PPO | Admitting: Family Medicine

## 2016-08-13 ENCOUNTER — Encounter: Payer: Self-pay | Admitting: Family Medicine

## 2016-08-13 VITALS — BP 120/79 | HR 83 | Temp 97.6°F | Ht 63.0 in | Wt 159.0 lb

## 2016-08-13 DIAGNOSIS — M79644 Pain in right finger(s): Secondary | ICD-10-CM

## 2016-08-13 DIAGNOSIS — Z13820 Encounter for screening for osteoporosis: Secondary | ICD-10-CM

## 2016-08-13 DIAGNOSIS — E559 Vitamin D deficiency, unspecified: Secondary | ICD-10-CM

## 2016-08-13 DIAGNOSIS — Z780 Asymptomatic menopausal state: Secondary | ICD-10-CM

## 2016-08-13 DIAGNOSIS — H04123 Dry eye syndrome of bilateral lacrimal glands: Secondary | ICD-10-CM | POA: Diagnosis not present

## 2016-08-13 DIAGNOSIS — I10 Essential (primary) hypertension: Secondary | ICD-10-CM

## 2016-08-13 DIAGNOSIS — E039 Hypothyroidism, unspecified: Secondary | ICD-10-CM

## 2016-08-13 DIAGNOSIS — J301 Allergic rhinitis due to pollen: Secondary | ICD-10-CM

## 2016-08-13 DIAGNOSIS — M797 Fibromyalgia: Secondary | ICD-10-CM | POA: Diagnosis not present

## 2016-08-13 DIAGNOSIS — L209 Atopic dermatitis, unspecified: Secondary | ICD-10-CM | POA: Diagnosis not present

## 2016-08-13 MED ORDER — FLUTICASONE PROPIONATE 50 MCG/ACT NA SUSP: 2.0000 | Freq: Every day | NASAL | 6 refills | Status: DC

## 2016-08-13 NOTE — Progress Notes (Signed)
Subjective:    Patient ID: Julie [name], female    DOB: [birth year], 61 y.o.   MRN: [id number]  HPI Pt here for follow up and management of chronic medical problems which includes hypertension and hypothyroid. She is taking medication regularly.The patient is doing well overall. She complains of some left ear popping sensation and some right middle finger pain. Her vital signs are stable. She is requesting some of her lab work to be done for her ophthalmologist. The patient says she hit her right middle finger on a countertop about 2-3 weeks ago and it is still hurting at the MCP joint. Her left ear has been popping off and on since January when she had her gallbladder removed. There could get sounding and tinnitus there. She does have a history of allergic rhinitis and is not using anything for that currently. She is up-to-date on her eye exams. She denies any chest pain or shortness of breath. She denies any trouble with her stomach and is feeling much better since she had her gallbladder removed. She did have a colonoscopy in November 2017 and was told by the gastroenterologist that she did not need another one for 10 years. She is passing her water without problems.     Patient Active Problem List   Diagnosis Date Noted  . Dry eye syndrome 09/02/2012  . 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 Anxiety 07/03/2012  . Hypothyroidism 06/25/2012  . Anemia 11/03/2011  . Hypernatremia 11/03/2011  . Hyperchloremia 11/03/2011  . Palpitations 11/02/2011  . Chest pain 11/02/2011  . Hypertension   . Fibromyalgia    Outpatient Encounter Prescriptions as of 08/13/2016  Medication Sig  . aspirin 81 MG tablet Take 81 mg by mouth daily.  . clonazePAM (KLONOPIN) 0.5 MG tablet Take 1 tablet (0.5 mg total) by mouth 3 (three) times daily as needed.  Marland Kitchen losartan-hydrochlorothiazide (HYZAAR) 50-12.5 MG tablet Take 1 tablet by mouth daily.  . nitroGLYCERIN (NITROSTAT) 0.4 MG SL tablet Place 1 tablet  (0.4 mg total) under the tongue every 5 (five) minutes as needed for chest pain.  Marland Kitchen SYNTHROID 100 MCG tablet TAKE 1 TABLET (100 MCG TOTAL) BY MOUTH DAILY.  . valACYclovir (VALTREX) 1000 MG tablet Take 2 tablets (2,000 mg total) by mouth 2 (two) times daily. (Patient taking differently: Take 2,000 mg by mouth. As needed)  . Vitamin D, Ergocalciferol, (DRISDOL) 50000 units CAPS capsule TAKE 1 CAPSULE (50,000 UNITS TOTAL) BY MOUTH EVERY 7 (SEVEN) DAYS.  . [DISCONTINUED] ciprofloxacin (CIPRO) 500 MG tablet Take 1 tablet (500 mg total) by mouth 2 (two) times daily.   No facility-administered encounter medications on file as of 08/13/2016.      Review of Systems  Constitutional: Negative.   HENT: Positive for ear pain (left "popping" ).   Eyes: Negative.   Respiratory: Negative.   Cardiovascular: Negative.   Gastrointestinal: Negative.   Endocrine: Negative.   Genitourinary: Negative.   Musculoskeletal: Negative.        Right middle finger pain  Skin: Negative.   Allergic/Immunologic: Negative.   Neurological: Negative.   Hematological: Negative.   Psychiatric/Behavioral: Negative.        Objective:   Physical Exam  Constitutional: She is oriented to person, place, and time. She appears well-developed and well-nourished. No distress.  The patient is pleasant and alert  HENT:  Head: Normocephalic and atraumatic.  Right Ear: External ear normal.  Left Ear: External ear normal.  Nose: Nose normal.  Mouth/Throat: Oropharynx is clear  and moist. No oropharyngeal exudate.  The TMs are dull but not read. There was no good light reflex on either TM. There is no wax present. There was nasal congestion and turbinate swelling bilaterally.  Eyes: Conjunctivae and EOM are normal. Pupils are equal, round, and reactive to light. Right eye exhibits no discharge. Left eye exhibits no discharge. No scleral icterus.  Neck: Normal range of motion. Neck supple. No thyromegaly present.  No bruits  thyromegaly or anterior cervical adenopathy  Cardiovascular: Normal rate, regular rhythm, normal heart sounds and intact distal pulses.   No murmur heard. The heart is regular at 72/m  Pulmonary/Chest: Effort normal and breath sounds normal. No respiratory distress. She has no wheezes. She has no rales.  Clear anteriorly and posteriorly  Abdominal: Soft. Bowel sounds are normal. She exhibits no mass. There is no tenderness. There is no rebound and no guarding.  Slight right upper quadrant tenderness most likely secondary to recent laparoscopic cholecystectomy. No liver or spleen enlargement no suprapubic tenderness and no inguinal adenopathy noted.  Musculoskeletal: Normal range of motion. She exhibits no edema.  Lymphadenopathy:    She has no cervical adenopathy.  Neurological: She is alert and oriented to person, place, and time. She has normal reflexes. No cranial nerve deficit.  Skin: Skin is warm and dry. Rash noted.  The patient did note a contact area of dermatitis on the anterior chest wall and she attributes some of this to possibly use of her cologn.  Psychiatric: She has a normal mood and affect. Her behavior is normal. Judgment and thought content normal.  Nursing note and vitals reviewed.  BP 120/79 (BP Location: Left Arm)   Pulse 83   Temp 97.6 F (36.4 C) (Oral)   Ht 5' 3"  (1.6 m)   Wt 159 lb (72.1 kg)   BMI 28.17 kg/m         Assessment & Plan:  1. Essential hypertension -The blood pressure is good today and she will continue with current treatment - BMP8+EGFR - CBC with Differential/Platelet - Hepatic function panel - NMR, lipoprofile - DG Chest 2 View; Future  2. Vitamin D deficiency -Continue with current treatment pending results of lab work - CBC with Differential/Platelet - VITAMIN D 25 Hydroxy (Vit-D Deficiency, Fractures) - DG [facility] DEXA; Future  3. Fibromyalgia -The patient is doing well with this currently. She has no complaints with this  today. - CBC with Differential/Platelet  4. Hypothyroidism, unspecified type -Energy wise, she feels good and she will continue with her current treatment pending results of lab work - CBC with Differential/Platelet - Thyroid Panel With TSH  5. Chronic dryness of both eyes -Follow-up with ophthalmology as planned and take copy of lab work with her to that visit - CBC with Differential/Platelet - ANA,IFA RA Diag Pnl w/rflx Tit/Patn - Rheumatoid factor - Sjogren's syndrome antibods(ssa + ssb)  6. Screening for osteoporosis -Continue to take calcium and vitamin D. - DG [facility] DEXA; Future  7. Postmenopausal -Weightbearing exercise and fall prevention - DG [facility] DEXA; Future  8. Seasonal allergic rhinitis due to pollen -Use Flonase regularly 1-2 sprays each nostril daily and use nasal saline during the day. -If the tinnitus is not resolved within 4 weeks the patient will call back and we will make an appointment with an ear nose and throat specialist for follow-up of tendinitis and diminished hearing.  9. Right hand pain -X-ray of hand and third fingers today  Meds ordered this encounter  Medications  .  fluticasone (FLONASE) 50 MCG/ACT nasal spray    Sig: Place 2 sprays into both nostrils daily.    Dispense:  16 g    Refill:  6   Patient Instructions  Continue current medications. Continue good therapeutic lifestyle changes which include good diet and exercise. Fall precautions discussed with patient. If an FOBT was given today- please return it to our front desk. If you are over 107 years old - you may need Prevnar 73 or the adult Pneumonia vaccine.  **Flu shots are available--- please call and schedule a FLU-CLINIC appointment**  After your visit with [place] today you will receive a survey in the mail or online from [employer] regarding your care with [place]. Please take a moment to fill this out. Your feedback is very important to [place] as you can help [place] better understand your patient  needs as well as improve your experience and satisfaction. WE CARE ABOUT YOU!!!   Drink plenty of fluids and stay well hydrated Use Flonase regularly starting with 2 sprays each nostril at bedtime and after week to 10 days reduce it to one spray each nostril at bedtime For a short period of time try some Claritin 1 daily in conjunction with the Flonase Use nasal saline during the day Call in 4 weeks if no better and we will make a referral to an ear nose and throat specialist because of the ongoing problems with hearing and tinnitus. Return the FOBT Avoid soap fabric softer detergent and perfume that may be scented Use cortisone 10 to rash area sparingly twice daily for the next 3-4 days to see if this clears this area up on the chest wall.      Arrie Senate MD   .

## 2016-08-13 NOTE — Patient Instructions (Addendum)
Continue current medications. Continue good therapeutic lifestyle changes which include good diet and exercise. Fall precautions discussed with patient. If an FOBT was given today- please return it to our front desk. If you are over 63 years old - you may need Prevnar 45 or the adult Pneumonia vaccine.  **Flu shots are available--- please call and schedule a FLU-CLINIC appointment**  After your visit with [place] today you will receive a survey in the mail or online from [employer] regarding your care with [place]. Please take a moment to fill this out. Your feedback is very important to [place] as you can help [place] better understand your patient needs as well as improve your experience and satisfaction. WE CARE ABOUT YOU!!!   Drink plenty of fluids and stay well hydrated Use Flonase regularly starting with 2 sprays each nostril at bedtime and after week to 10 days reduce it to one spray each nostril at bedtime For a short period of time try some Claritin 1 daily in conjunction with the Flonase Use nasal saline during the day Call in 4 weeks if no better and we will make a referral to an ear nose and throat specialist because of the ongoing problems with hearing and tinnitus. Return the FOBT Avoid soap fabric softer detergent and perfume that may be scented Use cortisone 10 to rash area sparingly twice daily for the next 3-4 days to see if this clears this area up on the chest wall.

## 2016-08-14 LAB — HEPATIC FUNCTION PANEL
ALBUMIN: 4.3 g/dL (ref 3.6–4.8)
ALT: 15 IU/L (ref 0–32)
AST: 13 IU/L (ref 0–40)
Alkaline Phosphatase: 80 IU/L (ref 39–117)
BILIRUBIN TOTAL: 0.4 mg/dL (ref 0.0–1.2)
BILIRUBIN, DIRECT: 0.1 mg/dL (ref 0.00–0.40)
TOTAL PROTEIN: 6.9 g/dL (ref 6.0–8.5)

## 2016-08-14 LAB — NMR, LIPOPROFILE
CHOLESTEROL: 195 mg/dL (ref 100–199)
HDL Cholesterol by NMR: 47 mg/dL (ref >39)
HDL PARTICLE NUMBER: 25.5 umol/L — AB (ref >=30.5)
LDL PARTICLE NUMBER: 1946 nmol/L — AB (ref <1000)
LDL SIZE: 20.4 nm (ref >20.5)
LDL-C: 126 mg/dL — ABNORMAL HIGH (ref 0–99)
LP-IR SCORE: 40 (ref <=45)
Small LDL Particle Number: 1000 nmol/L — ABNORMAL HIGH (ref <=527)
TRIGLYCERIDES BY NMR: 110 mg/dL (ref 0–149)

## 2016-08-14 LAB — CBC WITH DIFFERENTIAL/PLATELET
BASOS: 1 %
Basophils Absolute: 0 10*3/uL (ref 0.0–0.2)
EOS (ABSOLUTE): 0.2 10*3/uL (ref 0.0–0.4)
Eos: 4 %
Hematocrit: 39.5 % (ref 34.0–46.6)
Hemoglobin: 13.5 g/dL (ref 11.1–15.9)
IMMATURE GRANS (ABS): 0 10*3/uL (ref 0.0–0.1)
Immature Granulocytes: 0 %
LYMPHS: 22 %
Lymphocytes Absolute: 1 10*3/uL (ref 0.7–3.1)
MCH: 28.9 pg (ref 26.6–33.0)
MCHC: 34.2 g/dL (ref 31.5–35.7)
MCV: 85 fL (ref 79–97)
Monocytes Absolute: 0.3 10*3/uL (ref 0.1–0.9)
Monocytes: 7 %
NEUTROS PCT: 66 %
Neutrophils Absolute: 2.9 10*3/uL (ref 1.4–7.0)
PLATELETS: 260 10*3/uL (ref 150–379)
RBC: 4.67 x10E6/uL (ref 3.77–5.28)
RDW: 14 % (ref 12.3–15.4)
WBC: 4.3 10*3/uL (ref 3.4–10.8)

## 2016-08-14 LAB — BMP8+EGFR
BUN/Creatinine Ratio: 12 (ref 12–28)
BUN: 12 mg/dL (ref 8–27)
CALCIUM: 9.4 mg/dL (ref 8.7–10.3)
CO2: 24 mmol/L (ref 20–29)
CREATININE: 1.04 mg/dL — AB (ref 0.57–1.00)
Chloride: 102 mmol/L (ref 96–106)
GFR calc Af Amer: 66 mL/min/{1.73_m2} (ref >59)
GFR, EST NON AFRICAN AMERICAN: 57 mL/min/{1.73_m2} — AB (ref >59)
GLUCOSE: 84 mg/dL (ref 65–99)
POTASSIUM: 4.1 mmol/L (ref 3.5–5.2)
Sodium: 141 mmol/L (ref 134–144)

## 2016-08-14 LAB — FANA STAINING PATTERNS: Homogeneous Pattern: 1:80 {titer}

## 2016-08-14 LAB — THYROID PANEL WITH TSH
FREE THYROXINE INDEX: 2.1 (ref 1.2–4.9)
T3 UPTAKE RATIO: 27 % (ref 24–39)
T4 TOTAL: 7.8 ug/dL (ref 4.5–12.0)
TSH: 2.33 u[IU]/mL (ref 0.450–4.500)

## 2016-08-14 LAB — SJOGREN'S SYNDROME ANTIBODS(SSA + SSB)
ENA SSA (RO) Ab: <0.2 AI (ref 0.0–0.9)
ENA SSB (LA) Ab: <0.2 AI (ref 0.0–0.9)

## 2016-08-14 LAB — ANA,IFA RA DIAG PNL W/RFLX TIT/PATN
ANA Titer 1: POSITIVE — AB
CYCLIC CITRULLIN PEPTIDE AB: 15 U (ref 0–19)

## 2016-08-14 LAB — C-REACTIVE PROTEIN: CRP: 8.4 mg/L — ABNORMAL HIGH (ref 0.0–4.9)

## 2016-08-14 LAB — VITAMIN D 25 HYDROXY (VIT D DEFICIENCY, FRACTURES): Vit D, 25-Hydroxy: 59.9 ng/mL (ref 30.0–100.0)

## 2016-08-15 ENCOUNTER — Encounter: Payer: Self-pay | Admitting: Family Medicine

## 2016-08-20 ENCOUNTER — Encounter: Payer: Self-pay | Admitting: Pharmacist

## 2016-08-20 ENCOUNTER — Ambulatory Visit (INDEPENDENT_AMBULATORY_CARE_PROVIDER_SITE_OTHER): Payer: BC Managed Care – PPO | Admitting: Pharmacist

## 2016-08-20 VITALS — BP 118/74 | HR 78 | Ht 63.0 in | Wt 162.0 lb

## 2016-08-20 DIAGNOSIS — E782 Mixed hyperlipidemia: Secondary | ICD-10-CM | POA: Diagnosis not present
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
## 2016-08-24 ENCOUNTER — Other Ambulatory Visit: Payer: Self-pay | Admitting: *Deleted

## 2016-08-24 DIAGNOSIS — H04123 Dry eye syndrome of bilateral lacrimal glands: Secondary | ICD-10-CM

## 2016-08-24 NOTE — Addendum Note (Signed)
Addended by: Liliane Bade on: 08/24/2016 10:51 AM   Modules accepted: Orders

## 2016-09-14 ENCOUNTER — Other Ambulatory Visit: Payer: Self-pay | Admitting: Family Medicine

## 2016-11-14 ENCOUNTER — Other Ambulatory Visit: Payer: BC Managed Care – PPO

## 2016-11-14 DIAGNOSIS — I10 Essential (primary) hypertension: Secondary | ICD-10-CM

## 2016-11-14 DIAGNOSIS — E782 Mixed hyperlipidemia: Secondary | ICD-10-CM

## 2016-11-14 DIAGNOSIS — E039 Hypothyroidism, unspecified: Secondary | ICD-10-CM

## 2016-11-14 DIAGNOSIS — M797 Fibromyalgia: Secondary | ICD-10-CM

## 2016-11-14 DIAGNOSIS — E559 Vitamin D deficiency, unspecified: Secondary | ICD-10-CM

## 2016-11-15 LAB — THYROID PANEL WITH TSH
Free Thyroxine Index: 2.2 (ref 1.2–4.9)
T3 Uptake Ratio: 26 % (ref 24–39)
T4 TOTAL: 8.3 ug/dL (ref 4.5–12.0)
TSH: 4.97 u[IU]/mL — ABNORMAL HIGH (ref 0.450–4.500)

## 2016-11-15 LAB — HEPATIC FUNCTION PANEL
ALK PHOS: 77 IU/L (ref 39–117)
ALT: 9 IU/L (ref 0–32)
AST: 17 IU/L (ref 0–40)
Albumin: 4.6 g/dL (ref 3.6–4.8)
BILIRUBIN TOTAL: 0.5 mg/dL (ref 0.0–1.2)
Bilirubin, Direct: 0.12 mg/dL (ref 0.00–0.40)
Total Protein: 6.7 g/dL (ref 6.0–8.5)

## 2016-11-15 LAB — CBC WITH DIFFERENTIAL/PLATELET
BASOS: 1 %
Basophils Absolute: 0 10*3/uL (ref 0.0–0.2)
EOS (ABSOLUTE): 0.2 10*3/uL (ref 0.0–0.4)
Eos: 3 %
Hematocrit: 40.1 % (ref 34.0–46.6)
Hemoglobin: 13.5 g/dL (ref 11.1–15.9)
IMMATURE GRANULOCYTES: 0 %
Immature Grans (Abs): 0 10*3/uL (ref 0.0–0.1)
Lymphocytes Absolute: 1.5 10*3/uL (ref 0.7–3.1)
Lymphs: 33 %
MCH: 28.5 pg (ref 26.6–33.0)
MCHC: 33.7 g/dL (ref 31.5–35.7)
MCV: 85 fL (ref 79–97)
MONOS ABS: 0.4 10*3/uL (ref 0.1–0.9)
Monocytes: 8 %
NEUTROS PCT: 55 %
Neutrophils Absolute: 2.4 10*3/uL (ref 1.4–7.0)
PLATELETS: 254 10*3/uL (ref 150–379)
RBC: 4.73 x10E6/uL (ref 3.77–5.28)
RDW: 14 % (ref 12.3–15.4)
WBC: 4.4 10*3/uL (ref 3.4–10.8)

## 2016-11-15 LAB — LIPID PANEL
Chol/HDL Ratio: 4.3 ratio (ref 0.0–4.4)
Cholesterol, Total: 200 mg/dL — ABNORMAL HIGH (ref 100–199)
HDL: 46 mg/dL (ref >39)
LDL Calculated: 132 mg/dL — ABNORMAL HIGH (ref 0–99)
Triglycerides: 109 mg/dL (ref 0–149)
VLDL Cholesterol Cal: 22 mg/dL (ref 5–40)

## 2016-11-15 LAB — BMP8+EGFR
BUN / CREAT RATIO: 12 (ref 12–28)
BUN: 11 mg/dL (ref 8–27)
CO2: 24 mmol/L (ref 20–29)
Calcium: 9.5 mg/dL (ref 8.7–10.3)
Chloride: 100 mmol/L (ref 96–106)
Creatinine, Ser: 0.95 mg/dL (ref 0.57–1.00)
GFR, EST AFRICAN AMERICAN: 74 mL/min/{1.73_m2} (ref >59)
GFR, EST NON AFRICAN AMERICAN: 64 mL/min/{1.73_m2} (ref >59)
Glucose: 89 mg/dL (ref 65–99)
POTASSIUM: 4.2 mmol/L (ref 3.5–5.2)
SODIUM: 140 mmol/L (ref 134–144)

## 2016-11-15 LAB — VITAMIN D 25 HYDROXY (VIT D DEFICIENCY, FRACTURES): VIT D 25 HYDROXY: 54.4 ng/mL (ref 30.0–100.0)

## 2016-12-10 ENCOUNTER — Other Ambulatory Visit: Payer: Self-pay | Admitting: Family Medicine

## 2017-01-03 ENCOUNTER — Other Ambulatory Visit: Payer: BC Managed Care – PPO

## 2017-01-03 DIAGNOSIS — E039 Hypothyroidism, unspecified: Secondary | ICD-10-CM

## 2017-01-04 LAB — THYROID PANEL WITH TSH
Free Thyroxine Index: 1.9 (ref 1.2–4.9)
T3 Uptake Ratio: 27 % (ref 24–39)
T4 TOTAL: 7.2 ug/dL (ref 4.5–12.0)
TSH: 3.77 u[IU]/mL (ref 0.450–4.500)

## 2017-01-15 ENCOUNTER — Encounter: Payer: Self-pay | Admitting: Family Medicine

## 2017-01-15 DIAGNOSIS — E039 Hypothyroidism, unspecified: Secondary | ICD-10-CM

## 2017-01-15 MED ORDER — LEVOTHYROXINE SODIUM 112 MCG PO TABS: 112.0000 ug | ORAL_TABLET | Freq: Every day | ORAL | 0 refills | Status: DC

## 2017-01-16 ENCOUNTER — Other Ambulatory Visit: Payer: Self-pay | Admitting: *Deleted

## 2017-02-12 ENCOUNTER — Other Ambulatory Visit: Payer: Self-pay | Admitting: Family Medicine

## 2017-02-12 ENCOUNTER — Encounter: Payer: Self-pay | Admitting: Family Medicine

## 2017-02-13 ENCOUNTER — Ambulatory Visit: Payer: BC Managed Care – PPO | Admitting: Family Medicine

## 2017-02-20 ENCOUNTER — Ambulatory Visit: Payer: BC Managed Care – PPO | Admitting: Family Medicine

## 2017-02-20 ENCOUNTER — Encounter: Payer: Self-pay | Admitting: Family Medicine

## 2017-02-20 VITALS — BP 123/73 | HR 81 | Temp 97.0°F | Ht 63.0 in | Wt 157.0 lb

## 2017-02-20 DIAGNOSIS — E782 Mixed hyperlipidemia: Secondary | ICD-10-CM

## 2017-02-20 DIAGNOSIS — M797 Fibromyalgia: Secondary | ICD-10-CM

## 2017-02-20 DIAGNOSIS — I10 Essential (primary) hypertension: Secondary | ICD-10-CM | POA: Diagnosis not present

## 2017-02-20 DIAGNOSIS — E039 Hypothyroidism, unspecified: Secondary | ICD-10-CM

## 2017-02-20 DIAGNOSIS — H04123 Dry eye syndrome of bilateral lacrimal glands: Secondary | ICD-10-CM

## 2017-02-20 DIAGNOSIS — F410 Panic disorder [episodic paroxysmal anxiety] without agoraphobia: Secondary | ICD-10-CM | POA: Diagnosis not present

## 2017-02-20 DIAGNOSIS — E559 Vitamin D deficiency, unspecified: Secondary | ICD-10-CM

## 2017-02-20 MED ORDER — CLONAZEPAM 0.5 MG PO TABS: 0.5000 mg | ORAL_TABLET | Freq: Three times a day (TID) | ORAL | 0 refills | Status: DC | PRN

## 2017-02-20 NOTE — Patient Instructions (Addendum)
Medicare Annual Wellness Visit  [facility] and the medical providers at [facility] strive to bring you the best medical care.  In doing so we not only want to address your current medical conditions and concerns but also to detect new conditions early and prevent illness, disease and health-related problems.    Medicare offers a yearly Wellness Visit which allows our clinical staff to assess your need for preventative services including immunizations, lifestyle education, counseling to decrease risk of preventable diseases and screening for fall risk and other medical concerns.    This visit is provided free of charge (no copay) for all Medicare recipients. The clinical pharmacists at [facility] have begun to conduct these Wellness Visits which will also include a thorough review of all your medications.    As you primary medical provider recommend that you make an appointment for your Annual Wellness Visit if you have not done so already this year.  You may set up this appointment before you leave today or you may call back ([phone]) and schedule an appointment.  Please make sure when you call that you mention that you are scheduling your Annual Wellness Visit with the clinical pharmacist so that the appointment may be made for the proper length of time.     Continue current medications. Continue good therapeutic lifestyle changes which include good diet and exercise. Fall precautions discussed with patient. If an FOBT was given today- please return it to our front desk. If you are over 25 years old - you may need Prevnar 50 or the adult Pneumonia vaccine.  **Flu shots are available--- please call and schedule a FLU-CLINIC appointment**  After your visit with [place] today you will receive a survey in the mail or online from [employer] regarding your care with [place]. Please take a moment to fill this out. Your feedback is very  important to [place] as you can help [place] better understand your patient needs as well as improve your experience and satisfaction. WE CARE ABOUT YOU!!!   We will arrange for you to have an appointment with a rheumatologist to see if there could be any connection with the eye problems and some type of rheumatologic issue. We will also arrange for you to have a visit with an eye specialist because of the dry eyes. Continue to use the current eyedrops and keep the house as cool as possible and use cool mist humidification

## 2017-02-20 NOTE — Progress Notes (Signed)
Subjective:    Patient ID: Julie [name], female    DOB: 1956-02-17, 61 y.o.   MRN: [id number]  HPI Pt here for follow up and management of chronic medical problems which includes hypertension, hyperlipidemia and hypothyroid. She is taking medication regularly.  The patient is doing well with no specific complaints.  She is requesting refills on her Valtrex clonazepam.  She will get lab work today.  She will also be given an FOBT to return.  Her next colonoscopy should be coming up this coming year.  Has dry eye problems and she cannot seem to get this problem under good.  She is also worrying about some possible rheumatologic cause for the dry eyes and we will certainly arrange for her to see a rheumatologist.  She denies any chest pain or shortness of breath anymore than usual she does have some occasional twinges of discomfort in her upper abdomen which she thinks is coming from her recent gallbladder surgery.  She otherwise has no problems with nausea vomiting diarrhea blood in the stool or black tarry bowel movements.  In 2017 she did have a colonoscopy and endoscopy and all of this was good.  This is by Dr. Collene Mares.  She is passing her water without problems.  She also has panic attacks and she is intolerant of caffeine as these aggravate her panic attacks.  She is requesting a refill on her clonazepam.  She usually takes 1 or half a pill if needed for a panic attack.    Patient Active Problem List   Diagnosis Date Noted  . Dry eye syndrome 09/02/2012  . 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 Anxiety 07/03/2012  . Hypothyroidism 06/25/2012  . Anemia 11/03/2011  . Hypernatremia 11/03/2011  . Hyperchloremia 11/03/2011  . Palpitations 11/02/2011  . Chest pain 11/02/2011  . Hypertension   . Fibromyalgia    Outpatient Encounter Medications as of 02/20/2017  Medication Sig  . aspirin 81 MG tablet Take 81 mg by mouth daily.  . clonazePAM (KLONOPIN) 0.5 MG tablet Take 1 tablet (0.5 mg  total) by mouth 3 (three) times daily as needed.  . cycloSPORINE (RESTASIS) 0.05 % ophthalmic emulsion Place 1 drop into both eyes 2 (two) times daily.  . fluticasone (FLONASE) 50 MCG/ACT nasal spray Place 2 sprays into both nostrils daily.  Marland Kitchen levothyroxine (SYNTHROID, LEVOTHROID) 112 MCG tablet Take 1 tablet (112 mcg total) daily by mouth.  . losartan-hydrochlorothiazide (HYZAAR) 50-12.5 MG tablet TAKE 1 TABLET BY MOUTH DAILY.  . nitroGLYCERIN (NITROSTAT) 0.4 MG SL tablet Place 1 tablet (0.4 mg total) under the tongue every 5 (five) minutes as needed for chest pain.  . valACYclovir (VALTREX) 1000 MG tablet Take 2 tablets (2,000 mg total) by mouth 2 (two) times daily. (Patient taking differently: Take 2,000 mg by mouth. As needed)  . Vitamin D, Ergocalciferol, (DRISDOL) 50000 units CAPS capsule TAKE 1 CAPSULE (50,000 UNITS TOTAL) BY MOUTH EVERY 7 (SEVEN) DAYS.   No facility-administered encounter medications on file as of 02/20/2017.       Review of Systems  Constitutional: Negative.   HENT: Negative.   Eyes: Negative.   Respiratory: Negative.   Cardiovascular: Negative.   Gastrointestinal: Negative.   Endocrine: Negative.   Genitourinary: Negative.   Musculoskeletal: Negative.   Skin: Negative.   Allergic/Immunologic: Negative.   Neurological: Negative.   Hematological: Negative.   Psychiatric/Behavioral: Negative.        Objective:   Physical Exam  Constitutional: She is oriented to person,  place, and time. She appears well-developed and well-nourished. No distress.  She is pleasant and relaxed but remains very concerned about her ongoing problems and dry eye issues.  She also has panic attacks.  HENT:  Head: Normocephalic and atraumatic.  Right Ear: External ear normal.  Left Ear: External ear normal.  Nose: Nose normal.  Mouth/Throat: Oropharynx is clear and moist. No oropharyngeal exudate.  Eyes: EOM are normal. Pupils are equal, round, and reactive to light. Right eye  exhibits no discharge. Left eye exhibits no discharge. No scleral icterus.  Both eyes were red as far as the conjunctiva was concerned and there was slight swelling in both eyes from  ongoing persistent irritation.  Neck: Normal range of motion. Neck supple. No thyromegaly present.  No thyromegaly anterior cervical adenopathy or bruits  Cardiovascular: Normal rate, regular rhythm, normal heart sounds and intact distal pulses.  No murmur heard. Heart is regular at 72/min  Pulmonary/Chest: Effort normal and breath sounds normal. No respiratory distress. She has no wheezes. She has no rales. She exhibits no tenderness.  Clear anteriorly and posteriorly dry cough  Abdominal: Soft. Bowel sounds are normal. She exhibits no mass. There is no tenderness. There is no rebound and no guarding.  Scars from gallbladder surgery well-healed.  No epigastric tenderness.  No liver or spleen enlargement and no suprapubic tenderness.  Musculoskeletal: Normal range of motion. She exhibits no edema.  Lymphadenopathy:    She has no cervical adenopathy.  Neurological: She is alert and oriented to person, place, and time. She has normal reflexes. No cranial nerve deficit.  Skin: Skin is warm and dry. No rash noted.  Psychiatric: She has a normal mood and affect. Her behavior is normal. Judgment and thought content normal.  Nursing note and vitals reviewed.   BP 123/73 (BP Location: Left Arm)   Pulse 81   Temp (!) 97 F (36.1 C) (Oral)   Ht 5' 3" (1.6 m)   Wt 157 lb (71.2 kg)   BMI 27.81 kg/m        Assessment & Plan:  1. Essential hypertension -Blood pressure is good today and she will continue with current treatment - BMP8+EGFR - CBC with Differential/Platelet - Hepatic function panel  2. Mixed hyperlipidemia -Continue with aggressive therapeutic lifestyle changes pending results of lab work - CBC with Differential/Platelet - Lipid panel  3. Hypothyroidism, unspecified type -Continue with  current thyroid treatment pending results of lab work - CBC with Differential/Platelet - Thyroid Panel With TSH  4. Vitamin D deficiency -Continue with vitamin D replacement once weekly pending results of lab work - CBC with Differential/Platelet - VITAMIN D 25 Hydroxy (Vit-D Deficiency, Fractures)  5. Fibromyalgia -Continue Tylenol and stay active physically - CBC with Differential/Platelet  6. Panic disorder -Clonazepam as needed 0.5 1/2-1 tablet daily if needed  7. Chronic dryness of both eyes -Appointment with ophthalmology -Appointment with rheumatology  Meds ordered this encounter  Medications  . clonazePAM (KLONOPIN) 0.5 MG tablet    Sig: Take 1 tablet (0.5 mg total) by mouth 3 (three) times daily as needed.    Dispense:  90 tablet    Refill:  0   Patient Instructions                       Medicare Annual Wellness Visit  [facility] and the medical providers at [facility] strive to bring you the best medical care.  In doing so we not only want  to address your current medical conditions and concerns but also to detect new conditions early and prevent illness, disease and health-related problems.    Medicare offers a yearly Wellness Visit which allows our clinical staff to assess your need for preventative services including immunizations, lifestyle education, counseling to decrease risk of preventable diseases and screening for fall risk and other medical concerns.    This visit is provided free of charge (no copay) for all Medicare recipients. The clinical pharmacists at [facility] have begun to conduct these Wellness Visits which will also include a thorough review of all your medications.    As you primary medical provider recommend that you make an appointment for your Annual Wellness Visit if you have not done so already this year.  You may set up this appointment before you leave today or you may call back ([phone]) and  schedule an appointment.  Please make sure when you call that you mention that you are scheduling your Annual Wellness Visit with the clinical pharmacist so that the appointment may be made for the proper length of time.     Continue current medications. Continue good therapeutic lifestyle changes which include good diet and exercise. Fall precautions discussed with patient. If an FOBT was given today- please return it to our front desk. If you are over 60 years old - you may need Prevnar 15 or the adult Pneumonia vaccine.  **Flu shots are available--- please call and schedule a FLU-CLINIC appointment**  After your visit with [place] today you will receive a survey in the mail or online from [employer] regarding your care with [place]. Please take a moment to fill this out. Your feedback is very important to [place] as you can help [place] better understand your patient needs as well as improve your experience and satisfaction. WE CARE ABOUT YOU!!!   We will arrange for you to have an appointment with a rheumatologist to see if there could be any connection with the eye problems and some type of rheumatologic issue. We will also arrange for you to have a visit with an eye specialist because of the dry eyes. Continue to use the current eyedrops and keep the house as cool as possible and use cool mist humidification   Arrie Senate MD

## 2017-02-21 ENCOUNTER — Other Ambulatory Visit: Payer: Self-pay | Admitting: *Deleted

## 2017-02-21 LAB — CBC WITH DIFFERENTIAL/PLATELET
Basophils Absolute: 0 10*3/uL (ref 0.0–0.2)
Basos: 0 %
EOS (ABSOLUTE): 0.2 10*3/uL (ref 0.0–0.4)
EOS: 3 %
HEMATOCRIT: 40.8 % (ref 34.0–46.6)
HEMOGLOBIN: 13.5 g/dL (ref 11.1–15.9)
IMMATURE GRANULOCYTES: 0 %
Immature Grans (Abs): 0 10*3/uL (ref 0.0–0.1)
LYMPHS ABS: 1.1 10*3/uL (ref 0.7–3.1)
Lymphs: 25 %
MCH: 28.5 pg (ref 26.6–33.0)
MCHC: 33.1 g/dL (ref 31.5–35.7)
MCV: 86 fL (ref 79–97)
MONOCYTES: 7 %
Monocytes Absolute: 0.3 10*3/uL (ref 0.1–0.9)
Neutrophils Absolute: 2.9 10*3/uL (ref 1.4–7.0)
Neutrophils: 65 %
Platelets: 263 10*3/uL (ref 150–379)
RBC: 4.74 x10E6/uL (ref 3.77–5.28)
RDW: 13.6 % (ref 12.3–15.4)
WBC: 4.6 10*3/uL (ref 3.4–10.8)

## 2017-02-21 LAB — LIPID PANEL
CHOL/HDL RATIO: 4 ratio (ref 0.0–4.4)
Cholesterol, Total: 191 mg/dL (ref 100–199)
HDL: 48 mg/dL (ref >39)
LDL CALC: 126 mg/dL — AB (ref 0–99)
TRIGLYCERIDES: 87 mg/dL (ref 0–149)
VLDL CHOLESTEROL CAL: 17 mg/dL (ref 5–40)

## 2017-02-21 LAB — HEPATIC FUNCTION PANEL
ALBUMIN: 4.5 g/dL (ref 3.6–4.8)
ALK PHOS: 70 IU/L (ref 39–117)
ALT: 12 IU/L (ref 0–32)
AST: 16 IU/L (ref 0–40)
BILIRUBIN TOTAL: 0.4 mg/dL (ref 0.0–1.2)
Bilirubin, Direct: 0.11 mg/dL (ref 0.00–0.40)
Total Protein: 6.6 g/dL (ref 6.0–8.5)

## 2017-02-21 LAB — BMP8+EGFR
BUN / CREAT RATIO: 15 (ref 12–28)
BUN: 13 mg/dL (ref 8–27)
CO2: 24 mmol/L (ref 20–29)
CREATININE: 0.84 mg/dL (ref 0.57–1.00)
Calcium: 9.5 mg/dL (ref 8.7–10.3)
Chloride: 101 mmol/L (ref 96–106)
GFR calc Af Amer: 86 mL/min/{1.73_m2} (ref >59)
GFR calc non Af Amer: 74 mL/min/{1.73_m2} (ref >59)
GLUCOSE: 83 mg/dL (ref 65–99)
Potassium: 4.1 mmol/L (ref 3.5–5.2)
Sodium: 141 mmol/L (ref 134–144)

## 2017-02-21 LAB — THYROID PANEL WITH TSH
Free Thyroxine Index: 2.6 (ref 1.2–4.9)
T3 Uptake Ratio: 29 % (ref 24–39)
T4, Total: 9.1 ug/dL (ref 4.5–12.0)
TSH: 0.342 u[IU]/mL — ABNORMAL LOW (ref 0.450–4.500)

## 2017-02-21 LAB — VITAMIN D 25 HYDROXY (VIT D DEFICIENCY, FRACTURES): Vit D, 25-Hydroxy: 72 ng/mL (ref 30.0–100.0)

## 2017-02-21 MED ORDER — ROSUVASTATIN CALCIUM 5 MG PO TABS: 5.0000 mg | ORAL_TABLET | ORAL | 1 refills | Status: DC

## 2017-03-01 ENCOUNTER — Other Ambulatory Visit: Payer: Self-pay | Admitting: *Deleted

## 2017-03-09 ENCOUNTER — Other Ambulatory Visit: Payer: Self-pay | Admitting: Family Medicine

## 2017-03-11 ENCOUNTER — Other Ambulatory Visit: Payer: Self-pay | Admitting: Family Medicine

## 2017-03-11 ENCOUNTER — Ambulatory Visit
Admission: RE | Admit: 2017-03-11 | Discharge: 2017-03-11 | Disposition: A | Discharge status: Home / Self Care | Payer: BC Managed Care – PPO | Source: Ambulatory Visit | Attending: Family Medicine | Admitting: Family Medicine

## 2017-03-11 DIAGNOSIS — Z1239 Encounter for other screening for malignant neoplasm of breast: Secondary | ICD-10-CM

## 2017-04-02 ENCOUNTER — Other Ambulatory Visit: Payer: BC Managed Care – PPO

## 2017-04-02 DIAGNOSIS — I10 Essential (primary) hypertension: Secondary | ICD-10-CM

## 2017-04-02 DIAGNOSIS — E039 Hypothyroidism, unspecified: Secondary | ICD-10-CM

## 2017-04-02 NOTE — Addendum Note (Signed)
Addended by: Liliane Bade on: 04/02/2017 11:35 AM   Modules accepted: Orders

## 2017-04-03 LAB — HEPATIC FUNCTION PANEL
ALK PHOS: 65 IU/L (ref 39–117)
ALT: 14 IU/L (ref 0–32)
AST: 12 IU/L (ref 0–40)
Albumin: 4.1 g/dL (ref 3.6–4.8)
Bilirubin Total: 0.4 mg/dL (ref 0.0–1.2)
Bilirubin, Direct: 0.11 mg/dL (ref 0.00–0.40)
Total Protein: 6.6 g/dL (ref 6.0–8.5)

## 2017-04-03 LAB — THYROID PANEL WITH TSH
FREE THYROXINE INDEX: 1.7 (ref 1.2–4.9)
T3 Uptake Ratio: 24 % (ref 24–39)
T4 TOTAL: 7 ug/dL (ref 4.5–12.0)
TSH: 2.69 u[IU]/mL (ref 0.450–4.500)

## 2017-04-05 LAB — SPECIMEN STATUS REPORT

## 2017-04-05 LAB — LIPID PANEL
CHOLESTEROL TOTAL: 156 mg/dL (ref 100–199)
Chol/HDL Ratio: 3.1 ratio (ref 0.0–4.4)
HDL: 51 mg/dL (ref >39)
LDL Calculated: 85 mg/dL (ref 0–99)
Triglycerides: 100 mg/dL (ref 0–149)
VLDL Cholesterol Cal: 20 mg/dL (ref 5–40)

## 2017-04-12 ENCOUNTER — Other Ambulatory Visit: Payer: Self-pay | Admitting: Family Medicine

## 2017-05-19 ENCOUNTER — Other Ambulatory Visit: Payer: Self-pay | Admitting: Family Medicine

## 2017-06-01 ENCOUNTER — Other Ambulatory Visit: Payer: Self-pay | Admitting: Family Medicine

## 2017-07-12 ENCOUNTER — Other Ambulatory Visit: Payer: Self-pay | Admitting: Family Medicine

## 2017-08-06 ENCOUNTER — Other Ambulatory Visit: Payer: Self-pay | Admitting: Family Medicine

## 2017-08-17 ENCOUNTER — Other Ambulatory Visit: Payer: Self-pay | Admitting: Family Medicine

## 2017-08-21 ENCOUNTER — Encounter: Payer: Self-pay | Admitting: Family Medicine

## 2017-08-21 ENCOUNTER — Ambulatory Visit (INDEPENDENT_AMBULATORY_CARE_PROVIDER_SITE_OTHER): Payer: BC Managed Care – PPO

## 2017-08-21 ENCOUNTER — Ambulatory Visit: Payer: BC Managed Care – PPO | Admitting: Family Medicine

## 2017-08-21 VITALS — BP 121/79 | HR 82 | Temp 97.2°F | Ht 63.0 in | Wt 154.0 lb

## 2017-08-21 DIAGNOSIS — M545 Low back pain: Secondary | ICD-10-CM

## 2017-08-21 DIAGNOSIS — E039 Hypothyroidism, unspecified: Secondary | ICD-10-CM

## 2017-08-21 DIAGNOSIS — M797 Fibromyalgia: Secondary | ICD-10-CM

## 2017-08-21 DIAGNOSIS — E782 Mixed hyperlipidemia: Secondary | ICD-10-CM

## 2017-08-21 DIAGNOSIS — E559 Vitamin D deficiency, unspecified: Secondary | ICD-10-CM | POA: Diagnosis not present

## 2017-08-21 DIAGNOSIS — B001 Herpesviral vesicular dermatitis: Secondary | ICD-10-CM

## 2017-08-21 DIAGNOSIS — I10 Essential (primary) hypertension: Secondary | ICD-10-CM

## 2017-08-21 DIAGNOSIS — W57XXXA Bitten or stung by nonvenomous insect and other nonvenomous arthropods, initial encounter: Secondary | ICD-10-CM

## 2017-08-21 DIAGNOSIS — R351 Nocturia: Secondary | ICD-10-CM

## 2017-08-21 LAB — MICROSCOPIC EXAMINATION: RENAL EPITHEL UA: NONE SEEN /HPF

## 2017-08-21 LAB — URINALYSIS, COMPLETE
BILIRUBIN UA: NEGATIVE
Glucose, UA: NEGATIVE
Ketones, UA: NEGATIVE
NITRITE UA: NEGATIVE
PH UA: 6 (ref 5.0–7.5)
PROTEIN UA: NEGATIVE
Specific Gravity, UA: 1.015 (ref 1.005–1.030)
UUROB: 0.2 mg/dL (ref 0.2–1.0)

## 2017-08-21 MED ORDER — DOXYCYCLINE HYCLATE 100 MG PO TABS: 100.0000 mg | ORAL_TABLET | Freq: Two times a day (BID) | ORAL | 1 refills | Status: DC

## 2017-08-21 MED ORDER — VALACYCLOVIR HCL 1 G PO TABS: 2000.0000 mg | ORAL_TABLET | Freq: Two times a day (BID) | ORAL | 2 refills | Status: DC

## 2017-08-21 NOTE — Patient Instructions (Addendum)
Continue current medications. Continue good therapeutic lifestyle changes which include good diet and exercise. Fall precautions discussed with patient. If an FOBT was given today- please return it to our front desk. If you are over 64 years old - you may need Prevnar 78 or the adult Pneumonia vaccine.  **Flu shots are available--- please call and schedule a FLU-CLINIC appointment**  After your visit with [place] today you will receive a survey in the mail or online from [employer] regarding your care with [place]. Please take a moment to fill this out. Your feedback is very important to [place] as you can help [place] better understand your patient needs as well as improve your experience and satisfaction. WE CARE ABOUT YOU!!!   Continue exercise regimen Take Tylenol if needed for back pain Take antibiotic regularly with food for 3 weeks until all lab work is returned Keep self checked regularly for tick bites

## 2017-08-21 NOTE — Progress Notes (Signed)
Subjective:    Patient ID: Julie [name], female    DOB: October 13, 1955, 62 y.o.   MRN: [id number]  HPI Pt here for follow up and management of chronic medical problems which includes hypothyroid and hypertension. She is taking medication regularly.  The patient is doing well overall but does complain of nocturia and frequent tick bites.  She is requesting a refill on her Valtrex.  She is going to plan a pelvic exam and Pap smear with the mid-level here in the office and will be given an FOBT to return as well as get lab work today.  Family history is positive for diabetes coronary artery disease and asthma and a sister.  Her daughters also have thyroid issues.  The patient is pleasant and doing well and is a good historian.  She is fully retired and does seem to be much more relaxed.  Her energy level is good.  She denies any chest pain pressure tightness or shortness of breath.  She denies any trouble with swallowing but does have occasional indigestion for which she takes as needed omeprazole and this seems to help.  This was given to her by Dr. Collene Mares following her last endoscopy/colonoscopy in 2017.  She has not seen any blood in the stool or had any black tarry bowel movements.  She did have some nocturia since last night.  She also complains of some low back pain at times with certain movements and then it is better at other times.  She is currently trying to walk and exercise regularly but is concerned about the back pain.  Get some x-rays before she leaves so she will not be worried about this.  Also check a urinalysis.     Patient Active Problem List   Diagnosis Date Noted  . Dry eye syndrome 09/02/2012  . 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 Anxiety 07/03/2012  . Hypothyroidism 06/25/2012  . Anemia 11/03/2011  . Hypernatremia 11/03/2011  . Hyperchloremia 11/03/2011  . Palpitations 11/02/2011  . Chest pain 11/02/2011  . Hypertension   . Fibromyalgia    Outpatient Encounter  Medications as of 08/21/2017  Medication Sig  . aspirin 81 MG tablet Take 81 mg by mouth daily.  . clonazePAM (KLONOPIN) 0.5 MG tablet Take 1 tablet (0.5 mg total) by mouth 3 (three) times daily as needed.  . cycloSPORINE (RESTASIS) 0.05 % ophthalmic emulsion Place 1 drop into both eyes 2 (two) times daily.  . fluticasone (FLONASE) 50 MCG/ACT nasal spray Place 2 sprays into both nostrils daily.  Marland Kitchen losartan-hydrochlorothiazide (HYZAAR) 50-12.5 MG tablet TAKE 1 TABLET BY MOUTH EVERY DAY  . nitroGLYCERIN (NITROSTAT) 0.4 MG SL tablet PLACE 1 TABLET (0.4 MG TOTAL) UNDER THE TONGUE EVERY 5 (FIVE) MINUTES AS NEEDED FOR CHEST PAIN.  . rosuvastatin (CRESTOR) 5 MG tablet TAKE 1 TABLET (5 MG TOTAL) BY MOUTH 3 (THREE) TIMES A WEEK.  Marland Kitchen SYNTHROID 112 MCG tablet TAKE 1 TABLET (112 MCG TOTAL) DAILY BY MOUTH.  . valACYclovir (VALTREX) 1000 MG tablet Take 2 tablets (2,000 mg total) by mouth 2 (two) times daily. (Patient taking differently: Take 2,000 mg by mouth. As needed)  . Vitamin D, Ergocalciferol, (DRISDOL) 50000 units CAPS capsule TAKE 1 CAPSULE (50,000 UNITS TOTAL) BY MOUTH EVERY 7 (SEVEN) DAYS.   No facility-administered encounter medications on file as of 08/21/2017.      Review of Systems  Constitutional: Negative.   HENT: Negative.   Eyes: Negative.   Respiratory: Negative.   Cardiovascular: Negative.  Gastrointestinal: Negative.   Endocrine: Negative.   Genitourinary: Negative.        Noctuira  Musculoskeletal: Negative.   Skin: Negative.        Some recent tick bites (one there about 7 days)  Allergic/Immunologic: Negative.   Neurological: Negative.   Hematological: Negative.   Psychiatric/Behavioral: Negative.        Objective:   Physical Exam  Constitutional: She is oriented to person, place, and time. She appears well-developed and well-nourished. No distress.  Patient is pleasant and alert  HENT:  Head: Normocephalic and atraumatic.  Right Ear: External ear normal.  Left  Ear: External ear normal.  Nose: Nose normal.  Mouth/Throat: Oropharynx is clear and moist. No oropharyngeal exudate.  Eyes: Pupils are equal, round, and reactive to light. Conjunctivae and EOM are normal. Right eye exhibits no discharge. Left eye exhibits no discharge. No scleral icterus.  She is followed regularly by the ophthalmologist for her eye problems.  Neck: Normal range of motion. Neck supple. No thyromegaly present.  No bruits thyromegaly or anterior cervical adenopathy  Cardiovascular: Normal rate, regular rhythm, normal heart sounds and intact distal pulses.  No murmur heard. The heart is regular at 76/min  Pulmonary/Chest: Effort normal and breath sounds normal. She has no wheezes. She has no rales.  Clear anteriorly and posteriorly  Abdominal: Soft. Bowel sounds are normal. She exhibits no mass. There is no tenderness.  No abdominal tenderness masses organ enlargement or bruits  Musculoskeletal: Normal range of motion. She exhibits no edema.  Lymphadenopathy:    She has no cervical adenopathy.  Neurological: She is alert and oriented to person, place, and time. She has normal reflexes. No cranial nerve deficit or sensory deficit.  Skin: Skin is warm and dry. No rash noted.  Psychiatric: She has a normal mood and affect. Her behavior is normal. Judgment and thought content normal.  The patient is pleasant and alert with normal affect and demeanor  Nursing note and vitals reviewed.  BP 121/79 (BP Location: Right Arm)   Pulse 82   Temp (!) 97.2 F (36.2 C) (Oral)   Ht _0  (1.6 m)   Wt 154 lb (69.9 kg)   BMI 27.28 kg/m         Assessment & Plan:  1. Essential hypertension -Pressure is good today and she will continue with current treatment - CBC with Differential/Platelet - BMP8+EGFR - Hepatic function panel  2. Mixed hyperlipidemia -Continue with aggressive therapeutic lifestyle changes and Crestor pending results of lab work - CBC with  Differential/Platelet - Lipid panel  3. Vitamin D deficiency -Continue with vitamin D replacement pending results of lab work - CBC with Differential/Platelet - VITAMIN D 25 Hydroxy (Vit-D Deficiency, Fractures)  4. Hypothyroidism, unspecified type -Patient's energy level has been good and she will continue with current treatment pending results of lab work - CBC with Differential/Platelet - Thyroid Panel With TSH  5. Fibromyalgia -Continue with regular exercise and current treatment - CBC with Differential/Platelet  6. Nocturia -Drink plenty of fluids and stay well-hydrated - CBC with Differential/Platelet - Urinalysis, Complete - Urine Culture  7. Tick bite, initial encounter -Doxycycline 100 mg twice daily with food for 3 weeks with 1 refill - CBC with Differential/Platelet - Lyme Ab/Western Blot Reflex - Rocky mtn spotted fvr abs pnl(IgG+IgM)  8. Cold sore - valACYclovir (VALTREX) 1000 MG tablet; Take 2 tablets (2,000 mg total) by mouth 2 (two) times daily. as directed  Dispense: 30 tablet; Refill: 2  9. Low back pain, unspecified back pain laterality, unspecified chronicity, with sciatica presence unspecified - DG Lumbar Spine 2-3 Views; Future   Meds ordered this encounter  Medications  . valACYclovir (VALTREX) 1000 MG tablet    Sig: Take 2 tablets (2,000 mg total) by mouth 2 (two) times daily. as directed    Dispense:  30 tablet    Refill:  2  . doxycycline (VIBRA-TABS) 100 MG tablet    Sig: Take 1 tablet (100 mg total) by mouth 2 (two) times daily. 1 po bid    Dispense:  42 tablet    Refill:  1   Patient Instructions  Continue current medications. Continue good therapeutic lifestyle changes which include good diet and exercise. Fall precautions discussed with patient. If an FOBT was given today- please return it to our front desk. If you are over 89 years old - you may need Prevnar 12 or the adult Pneumonia vaccine.  **Flu shots are available--- please  call and schedule a FLU-CLINIC appointment**  After your visit with [place] today you will receive a survey in the mail or online from [employer] regarding your care with [place]. Please take a moment to fill this out. Your feedback is very important to [place] as you can help [place] better understand your patient needs as well as improve your experience and satisfaction. WE CARE ABOUT YOU!!!   Continue exercise regimen Take Tylenol if needed for back pain Take antibiotic regularly with food for 3 weeks until all lab work is returned Keep self checked regularly for tick bites  Arrie Senate MD

## 2017-08-22 LAB — URINE CULTURE

## 2017-08-23 LAB — HEPATIC FUNCTION PANEL
ALBUMIN: 4.6 g/dL (ref 3.6–4.8)
ALT: 13 IU/L (ref 0–32)
AST: 13 IU/L (ref 0–40)
Alkaline Phosphatase: 82 IU/L (ref 39–117)
Bilirubin Total: 0.5 mg/dL (ref 0.0–1.2)
Bilirubin, Direct: 0.14 mg/dL (ref 0.00–0.40)
TOTAL PROTEIN: 6.9 g/dL (ref 6.0–8.5)

## 2017-08-23 LAB — THYROID PANEL WITH TSH
Free Thyroxine Index: 2.1 (ref 1.2–4.9)
T3 Uptake Ratio: 26 % (ref 24–39)
T4 TOTAL: 8 ug/dL (ref 4.5–12.0)
TSH: 0.435 u[IU]/mL — ABNORMAL LOW (ref 0.450–4.500)

## 2017-08-23 LAB — BMP8+EGFR
BUN / CREAT RATIO: 13 (ref 12–28)
BUN: 13 mg/dL (ref 8–27)
CHLORIDE: 101 mmol/L (ref 96–106)
CO2: 23 mmol/L (ref 20–29)
Calcium: 9.4 mg/dL (ref 8.7–10.3)
Creatinine, Ser: 1.01 mg/dL — ABNORMAL HIGH (ref 0.57–1.00)
GFR calc Af Amer: 68 mL/min/{1.73_m2} (ref >59)
GFR calc non Af Amer: 59 mL/min/{1.73_m2} — ABNORMAL LOW (ref >59)
GLUCOSE: 88 mg/dL (ref 65–99)
POTASSIUM: 4 mmol/L (ref 3.5–5.2)
SODIUM: 139 mmol/L (ref 134–144)

## 2017-08-23 LAB — CBC WITH DIFFERENTIAL/PLATELET
BASOS ABS: 0 10*3/uL (ref 0.0–0.2)
Basos: 1 %
EOS (ABSOLUTE): 0.2 10*3/uL (ref 0.0–0.4)
Eos: 4 %
HEMOGLOBIN: 13.3 g/dL (ref 11.1–15.9)
Hematocrit: 39.7 % (ref 34.0–46.6)
IMMATURE GRANS (ABS): 0 10*3/uL (ref 0.0–0.1)
Immature Granulocytes: 0 %
LYMPHS ABS: 1 10*3/uL (ref 0.7–3.1)
LYMPHS: 23 %
MCH: 28.4 pg (ref 26.6–33.0)
MCHC: 33.5 g/dL (ref 31.5–35.7)
MCV: 85 fL (ref 79–97)
MONOCYTES: 8 %
Monocytes Absolute: 0.4 10*3/uL (ref 0.1–0.9)
NEUTROS ABS: 2.9 10*3/uL (ref 1.4–7.0)
Neutrophils: 64 %
PLATELETS: 287 10*3/uL (ref 150–450)
RBC: 4.68 x10E6/uL (ref 3.77–5.28)
RDW: 13.7 % (ref 12.3–15.4)
WBC: 4.4 10*3/uL (ref 3.4–10.8)

## 2017-08-23 LAB — LIPID PANEL
CHOLESTEROL TOTAL: 143 mg/dL (ref 100–199)
Chol/HDL Ratio: 2.9 ratio (ref 0.0–4.4)
HDL: 49 mg/dL (ref >39)
LDL CALC: 78 mg/dL (ref 0–99)
TRIGLYCERIDES: 82 mg/dL (ref 0–149)
VLDL CHOLESTEROL CAL: 16 mg/dL (ref 5–40)

## 2017-08-23 LAB — ROCKY MTN SPOTTED FVR ABS PNL(IGG+IGM)
RMSF IGG: POSITIVE — AB
RMSF IGM: 0.17 {index} (ref 0.00–0.89)

## 2017-08-23 LAB — VITAMIN D 25 HYDROXY (VIT D DEFICIENCY, FRACTURES): Vit D, 25-Hydroxy: 70.1 ng/mL (ref 30.0–100.0)

## 2017-08-23 LAB — LYME AB/WESTERN BLOT REFLEX

## 2017-08-23 LAB — RMSF, IGG, IFA: RMSF, IGG, IFA: <1:64 {titer}

## 2017-08-25 ENCOUNTER — Other Ambulatory Visit: Payer: Self-pay | Admitting: Family Medicine

## 2017-08-26 ENCOUNTER — Encounter: Payer: Self-pay | Admitting: Family

## 2017-08-26 ENCOUNTER — Ambulatory Visit (INDEPENDENT_AMBULATORY_CARE_PROVIDER_SITE_OTHER): Payer: BC Managed Care – PPO | Admitting: Family

## 2017-08-26 VITALS — BP 112/65 | HR 76 | Temp 97.5°F | Ht 63.0 in | Wt 156.4 lb

## 2017-08-26 DIAGNOSIS — Z01419 Encounter for gynecological examination (general) (routine) without abnormal findings: Secondary | ICD-10-CM | POA: Diagnosis not present

## 2017-08-26 DIAGNOSIS — Z0000 Encounter for general adult medical examination without abnormal findings: Secondary | ICD-10-CM | POA: Diagnosis not present

## 2017-08-26 DIAGNOSIS — N952 Postmenopausal atrophic vaginitis: Secondary | ICD-10-CM

## 2017-08-26 DIAGNOSIS — E663 Overweight: Secondary | ICD-10-CM | POA: Insufficient documentation

## 2017-08-26 LAB — URINALYSIS, COMPLETE
BILIRUBIN UA: NEGATIVE
Glucose, UA: NEGATIVE
KETONES UA: NEGATIVE
LEUKOCYTES UA: NEGATIVE
Nitrite, UA: NEGATIVE
PROTEIN UA: NEGATIVE
RBC UA: NEGATIVE
SPEC GRAV UA: 1.02 (ref 1.005–1.030)
UUROB: 0.2 mg/dL (ref 0.2–1.0)
pH, UA: 5.5 (ref 5.0–7.5)

## 2017-08-26 LAB — MICROSCOPIC EXAMINATION
BACTERIA UA: NONE SEEN
RBC, UA: NONE SEEN /hpf (ref 0–2)
RENAL EPITHEL UA: NONE SEEN /HPF
WBC UA: NONE SEEN /HPF (ref 0–5)

## 2017-08-26 MED ORDER — ESTROGENS, CONJUGATED 0.625 MG/GM VA CREA: TOPICAL_CREAM | VAGINAL | 12 refills | Status: DC

## 2017-08-26 NOTE — Patient Instructions (Addendum)
Atrophic Vaginitis Atrophic vaginitis is a condition in which the tissues that line the vagina become dry and thin. This condition is most common in women who have stopped having regular menstrual periods (menopause). This usually starts when a woman is 45-64 years old. Estrogen helps to keep the vagina moist. It stimulates the vagina to produce a clear fluid that lubricates the vagina for sexual intercourse. This fluid also protects the vagina from infection. Lack of estrogen can cause the lining of the vagina to get thinner and dryer. The vagina may also shrink in size. It may become less elastic. Atrophic vaginitis tends to get worse over time as a woman's estrogen level drops. What are the causes? This condition is caused by the normal drop in estrogen that happens around the time of menopause. What increases the risk? Certain conditions or situations may lower a woman's estrogen level, which increases her risk of atrophic vaginitis. These include:  Taking medicine that blocks estrogen.  Having ovaries removed surgically.  Being treated for cancer with X-ray treatment (radiation) or medicines (chemotherapy).  Exercising very hard and often.  Having an eating disorder (anorexia).  Giving birth or breastfeeding.  Being over the age of 50.  Smoking.  What are the signs or symptoms? Symptoms of this condition include:  Pain, soreness, or bleeding during sexual intercourse (dyspareunia).  Vaginal burning, irritation, or itching.  Pain or bleeding during a vaginal examination using a speculum (pelvic exam).  Loss of interest in sexual activity.  Having burning pain when passing urine.  Vaginal discharge that is brown or yellow.  In some cases, there are no symptoms. How is this diagnosed? This condition is diagnosed with a medical history and physical exam. This will include a pelvic exam that checks whether the inside of your vagina appears pale, thin, or dry. Rarely, you may  also have other tests, including:  A urine test.  A test that checks the acid balance in your vaginal fluid (acid balance test).  How is this treated? Treatment for this condition may depend on the severity of your symptoms. Treatment may include:  Using an over-the-counter vaginal lubricant before you have sexual intercourse.  Using a long-acting vaginal moisturizer.  Using low-dose vaginal estrogen for moderate to severe symptoms that do not respond to other treatments. Options include creams, tablets, and inserts (vaginal rings). Before using vaginal estrogen, tell your health care provider if you have a history of: ? Breast cancer. ? Endometrial cancer. ? Blood clots.  Taking medicines. You may be able to take a daily pill for dyspareunia. Discuss all of the risks of this medicine with your health care provider. It is usually not recommended for women who have a family history or personal history of breast cancer.  If your symptoms are very mild and you are not sexually active, you may not need treatment. Follow these instructions at home:  Take medicines only as directed by your health care provider. Do not use herbal or alternative medicines unless your health care provider says that you can.  Use over-the-counter creams, lubricants, or moisturizers for dryness only as directed by your health care provider.  If your atrophic vaginitis is caused by menopause, discuss all of your menopausal symptoms and treatment options with your health care provider.  Do not douche.  Do not use products that can make your vagina dry. These include: ? Scented feminine sprays. ? Scented tampons. ? Scented soaps.  If it hurts to have sex, talk with your sexual   partner. Contact a health care provider if:  Your discharge looks different than normal.  Your vagina has an unusual smell.  You have new symptoms.  Your symptoms do not improve with treatment.  Your symptoms get worse. This  information is not intended to replace advice given to you by your health care provider. Make sure you discuss any questions you have with your health care provider. Document Released: 07/06/2014 Document Revised: 07/28/2015 Document Reviewed: 02/10/2014 Elsevier Interactive Patient Education  2018 [employer]  

## 2017-08-26 NOTE — Progress Notes (Signed)
   Subjective:    Patient ID: Julie [name], female    DOB: 12/30/1955, 62 y.o.   MRN: [id number]  Chief Complaint  Patient presents with  . Gynecologic Exam    HPI Pt presents to the office today for CPE and GYN exam. Pt is followed by Dr. Laurance Flatten every 6 months for chronic follow up. She had lab work drawn 08/21/17.  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 Pt denies any headache, palpitations, SOB, or edema at this time.      Review of Systems  All other systems reviewed and are negative.      Objective:   Physical Exam  Constitutional: She is oriented to person, place, and time. She appears well-developed and well-nourished. No distress.  HENT:  Head: Normocephalic and atraumatic.  Right Ear: External ear normal.  Left Ear: External ear normal.  Mouth/Throat: Oropharynx is clear and moist.  Eyes: Pupils are equal, round, and reactive to light.  Neck: Normal range of motion. Neck supple. No thyromegaly present.  Cardiovascular: Normal rate, regular rhythm, normal heart sounds and intact distal pulses.  No murmur heard. Pulmonary/Chest: Effort normal and breath sounds normal. No respiratory distress. She has no wheezes. Right breast exhibits no inverted nipple, no mass, no nipple discharge, no skin change and no tenderness. Left breast exhibits no inverted nipple, no mass, no nipple discharge, no skin change and no tenderness.  Abdominal: Soft. Bowel sounds are normal. She exhibits no distension. There is no tenderness.  Genitourinary: Vagina normal.  Genitourinary Comments: Bimanual exam- no adnexal masses or tenderness, ovaries nonpalpable   Cervix not present,  No discharge  Tissue atrophic    Musculoskeletal: Normal range of motion. She exhibits no edema or tenderness.  Neurological: She is alert and oriented to person, place, and time. She has normal reflexes. No cranial nerve deficit.  Skin: Skin is warm and dry.  Psychiatric: She  has a normal mood and affect. Her behavior is normal. Judgment and thought content normal.  Vitals reviewed.     BP 112/65   Pulse 76   Temp (!) 97.5 F (36.4 C) (Oral)   Ht 5\' 3"  (1.6 m)   Wt 156 lb 6.4 oz (70.9 kg)   BMI 27.71 kg/m      Assessment & Plan:  Julie [name] comes in today with chief complaint of Gynecologic Exam   Diagnosis and orders addressed:  1. Normal gynecologic examination - Urinalysis, Complete - IGP, Aptima HPV, rfx 16/18,45  2. Annual physical exam - IGP, Aptima HPV, rfx 16/18,45  3. Overweight (BMI 25.0-29.9)  4. Vaginal atrophy - conjugated estrogens (PREMARIN) vaginal cream; 1 application three times a week  Dispense: 42.5 g; Refill: 12   Labs reviewed Health Maintenance reviewed- will get Colonoscopy report and scanned into chart Diet and exercise encouraged  Follow up plan: Keep follow up with PCP   Evelina Dun, FNP

## 2017-08-28 LAB — IGP, APTIMA HPV, RFX 16/18,45
HPV APTIMA: NEGATIVE
PAP SMEAR COMMENT: 0

## 2017-09-06 ENCOUNTER — Other Ambulatory Visit: Payer: BC Managed Care – PPO

## 2017-09-06 ENCOUNTER — Encounter: Payer: Self-pay | Admitting: Family Medicine

## 2017-09-06 DIAGNOSIS — W57XXXA Bitten or stung by nonvenomous insect and other nonvenomous arthropods, initial encounter: Secondary | ICD-10-CM

## 2017-09-10 LAB — ROCKY MTN SPOTTED FVR ABS PNL(IGG+IGM)
RMSF IGM: 0.21 {index} (ref 0.00–0.89)
RMSF IgG: POSITIVE — AB

## 2017-09-10 LAB — LYME AB/WESTERN BLOT REFLEX: Lyme IgG/IgM Ab: <0.91 {ISR} (ref 0.00–0.90)

## 2017-09-10 LAB — RMSF, IGG, IFA: RMSF, IGG, IFA: 1:64 {titer} — ABNORMAL HIGH

## 2017-09-11 ENCOUNTER — Telehealth: Payer: Self-pay | Admitting: Family Medicine

## 2017-09-11 NOTE — Telephone Encounter (Signed)
Patient aware of results.

## 2017-09-18 ENCOUNTER — Encounter: Payer: Self-pay | Admitting: Family Medicine

## 2017-09-26 ENCOUNTER — Other Ambulatory Visit: Payer: Self-pay | Admitting: Family Medicine

## 2017-09-26 NOTE — Telephone Encounter (Signed)
Last seen 08/26/17

## 2017-09-30 ENCOUNTER — Encounter: Payer: Self-pay | Admitting: Family Medicine

## 2017-10-01 ENCOUNTER — Other Ambulatory Visit: Payer: Self-pay | Admitting: *Deleted

## 2017-10-01 ENCOUNTER — Other Ambulatory Visit: Payer: BC Managed Care – PPO

## 2017-10-01 DIAGNOSIS — R5383 Other fatigue: Secondary | ICD-10-CM

## 2017-10-02 ENCOUNTER — Encounter: Payer: Self-pay | Admitting: *Deleted

## 2017-10-02 LAB — VITAMIN B12: Vitamin B-12: 431 pg/mL (ref 232–1245)

## 2017-10-15 ENCOUNTER — Other Ambulatory Visit: Payer: Self-pay | Admitting: Family Medicine

## 2017-11-03 ENCOUNTER — Other Ambulatory Visit: Payer: Self-pay | Admitting: Family Medicine

## 2017-11-13 ENCOUNTER — Other Ambulatory Visit: Payer: Self-pay | Admitting: Family Medicine

## 2017-11-13 NOTE — Telephone Encounter (Signed)
Last Vit D 08/21/17  70.1 

## 2017-11-15 ENCOUNTER — Other Ambulatory Visit: Payer: Self-pay | Admitting: Family Medicine

## 2017-12-08 ENCOUNTER — Other Ambulatory Visit: Payer: Self-pay | Admitting: Family Medicine

## 2017-12-12 ENCOUNTER — Other Ambulatory Visit: Payer: Self-pay | Admitting: Family Medicine

## 2017-12-17 ENCOUNTER — Other Ambulatory Visit: Payer: Self-pay | Admitting: Family Medicine

## 2017-12-27 ENCOUNTER — Other Ambulatory Visit: Payer: Self-pay | Admitting: Family Medicine

## 2018-02-18 ENCOUNTER — Encounter: Payer: Self-pay | Admitting: Family Medicine

## 2018-02-18 ENCOUNTER — Ambulatory Visit: Payer: BC Managed Care – PPO | Admitting: Family Medicine

## 2018-02-18 VITALS — BP 125/83 | HR 86 | Temp 96.5°F | Ht 63.0 in | Wt 159.0 lb

## 2018-02-18 DIAGNOSIS — E039 Hypothyroidism, unspecified: Secondary | ICD-10-CM | POA: Diagnosis not present

## 2018-02-18 DIAGNOSIS — I10 Essential (primary) hypertension: Secondary | ICD-10-CM

## 2018-02-18 DIAGNOSIS — M797 Fibromyalgia: Secondary | ICD-10-CM

## 2018-02-18 DIAGNOSIS — R252 Cramp and spasm: Secondary | ICD-10-CM

## 2018-02-18 DIAGNOSIS — H04121 Dry eye syndrome of right lacrimal gland: Secondary | ICD-10-CM

## 2018-02-18 DIAGNOSIS — R5383 Other fatigue: Secondary | ICD-10-CM

## 2018-02-18 DIAGNOSIS — E782 Mixed hyperlipidemia: Secondary | ICD-10-CM | POA: Diagnosis not present

## 2018-02-18 DIAGNOSIS — E559 Vitamin D deficiency, unspecified: Secondary | ICD-10-CM

## 2018-02-18 DIAGNOSIS — M47816 Spondylosis without myelopathy or radiculopathy, lumbar region: Secondary | ICD-10-CM

## 2018-02-18 MED ORDER — CLONAZEPAM 0.5 MG PO TABS: 0.5000 mg | ORAL_TABLET | Freq: Three times a day (TID) | ORAL | 5 refills | Status: DC | PRN

## 2018-02-18 NOTE — Patient Instructions (Addendum)
Continue current medications. Continue good therapeutic lifestyle changes which include good diet and exercise. Fall precautions discussed with patient. If an FOBT was given today- please return it to our front desk. If you are over 64 years old - you may need Prevnar 96 or the adult Pneumonia vaccine.  **Flu shots are available--- please call and schedule a FLU-CLINIC appointment**  After your visit with [place] today you will receive a survey in the mail or online from [employer] regarding your care with [place]. Please take a moment to fill this out. Your feedback is very important to [place] as you can help [place] better understand your patient needs as well as improve your experience and satisfaction. WE CARE ABOUT YOU!!!   Avoid aggressive exercising in the next week or so and only get out and walk on a regular basis with a good pair shoes Use warm wet compresses to the back and take extra strength Tylenol if needed for pain Keep the house as cool as possible Continue to drink plenty of water and fluids If the back pain continues, call us and we will make arrangements for you to see Dr. Herma Mering and orthopedic specialist with [facility].

## 2018-02-18 NOTE — Progress Notes (Signed)
Subjective:    Patient ID: Julie [name], female    DOB: Oct 29, 1955, 62 y.o.   MRN: [id number]  HPI Pt here for follow up and management of chronic medical problems which includes hypertension and hyperlipidemia. She is taking medication regularly.  Patient comes in today with several complaints.  She has right lower back pain dry eyes and throat and hoarseness.  She also complains with fatigue and muscle cramps.  She requesting a refill on her clonazepam.  She will be given an FOBT to return and will get lab work today.  Her vital signs are stable her weight is up 3 pounds.  She had a colonoscopy in 2017.  The patient has seen the ophthalmologist and he is working on her dry eye issues.  She has seen a rheumatologist because of dry eyes vaginal dryness and was checked by him with no abnormal findings noted.  She also has had an endoscopy and a colonoscopy by Dr. Collene Mares and there was no issues noted at the time with the dryness with swallowing which has been worse recently.  She does have this ongoing fatigue and muscle cramps.  We will add to the blood work that she is getting today a CK and B12 level and magnesium level.  We did indicate to her that if she continues to have back pain and we gave her a copy of her LS spine films that were done the summer that she will get in touch with Dr. Herma Mering regarding her ongoing back pain.  Today she denies any chest pain or shortness of breath.  She did have a recent upper respiratory infection and had some shortness of breath associated with this and this seems to be getting better.  She denies any trouble with her stomach other than dryness with swallowing.  She has had no blood in the stool black tarry bowel movements or change in bowel habits and had her colonoscopy done in November 2017 and was told that she did not need another one for 10 years.  She is passing her water well.     Patient Active Problem List   Diagnosis Date Noted  . Overweight (BMI  25.0-29.9) 08/26/2017  . Dry eye syndrome 09/02/2012  . 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 Anxiety 07/03/2012  . Hypothyroidism 06/25/2012  . Anemia 11/03/2011  . Hypernatremia 11/03/2011  . Hyperchloremia 11/03/2011  . Palpitations 11/02/2011  . Chest pain 11/02/2011  . Hypertension   . Fibromyalgia    Outpatient Encounter Medications as of 02/18/2018  Medication Sig  . aspirin 81 MG tablet Take 81 mg by mouth daily.  . clonazePAM (KLONOPIN) 0.5 MG tablet Take 1 tablet (0.5 mg total) by mouth 3 (three) times daily as needed.  . conjugated estrogens (PREMARIN) vaginal cream 1 application three times a week  . cycloSPORINE (RESTASIS) 0.05 % ophthalmic emulsion Place 1 drop into both eyes 2 (two) times daily.  Marland Kitchen losartan-hydrochlorothiazide (HYZAAR) 50-12.5 MG tablet TAKE 1 TABLET BY MOUTH EVERY DAY  . nitroGLYCERIN (NITROSTAT) 0.4 MG SL tablet PLACE 1 TABLET (0.4 MG TOTAL) UNDER THE TONGUE EVERY 5 (FIVE) MINUTES AS NEEDED FOR CHEST PAIN.  . rosuvastatin (CRESTOR) 5 MG tablet TAKE 1 TABLET (5 MG TOTAL) BY MOUTH 3 (THREE) TIMES A WEEK.  Marland Kitchen SYNTHROID 112 MCG tablet TAKE 1 TABLET (112 MCG TOTAL) DAILY BY MOUTH.  . valACYclovir (VALTREX) 1000 MG tablet Take 2 tablets (2,000 mg total) by mouth 2 (two) times daily. as directed  .  Vitamin D, Ergocalciferol, (DRISDOL) 50000 units CAPS capsule TAKE 1 CAPSULE (50,000 UNITS TOTAL) BY MOUTH EVERY 7 (SEVEN) DAYS.  . [DISCONTINUED] doxycycline (VIBRA-TABS) 100 MG tablet TAKE 1 TABLET BY MOUTH TWICE A DAY  . [DISCONTINUED] fluticasone (FLONASE) 50 MCG/ACT nasal spray Place 2 sprays into both nostrils daily.   No facility-administered encounter medications on file as of 02/18/2018.      Review of Systems  Constitutional: Positive for fatigue.  HENT: Negative.        Dry eyes and dry in throat/ hoarse   Eyes: Negative.   Respiratory: Negative.   Cardiovascular: Negative.   Gastrointestinal: Negative.   Endocrine: Negative.     Genitourinary: Negative.   Musculoskeletal: Positive for back pain (right lower back pain x several days ) and myalgias (muscle cramps).  Skin: Negative.   Allergic/Immunologic: Negative.   Neurological: Negative.   Hematological: Negative.   Psychiatric/Behavioral: Negative.        Objective:   Physical Exam Vitals signs and nursing note reviewed.  Constitutional:      Appearance: Normal appearance. She is well-developed and normal weight.     Comments: Patient is pleasant and alert with several complaints  HENT:     Head: Normocephalic and atraumatic.     Right Ear: Tympanic membrane, ear canal and external ear normal. There is no impacted cerumen.     Left Ear: Tympanic membrane, ear canal and external ear normal. There is no impacted cerumen.     Nose: Congestion present. No rhinorrhea.     Comments: Slight nasal congestion    Mouth/Throat:     Mouth: Mucous membranes are moist.     Pharynx: Oropharynx is clear. No oropharyngeal exudate or posterior oropharyngeal erythema.  Eyes:     General: No scleral icterus.       Right eye: No discharge.        Left eye: No discharge.     Extraocular Movements: Extraocular movements intact.     Conjunctiva/sclera: Conjunctivae normal.     Pupils: Pupils are equal, round, and reactive to light.     Comments: Conjunctival redness bilaterally  Neck:     Musculoskeletal: Normal range of motion and neck supple.     Thyroid: No thyromegaly.     Vascular: No carotid bruit or JVD.  Cardiovascular:     Rate and Rhythm: Normal rate and regular rhythm.     Pulses: Normal pulses.     Heart sounds: Normal heart sounds. No murmur.     Comments: Heart is regular at 72/min Pulmonary:     Effort: Pulmonary effort is normal.     Breath sounds: Normal breath sounds. No wheezing or rales.     Comments: Clear anteriorly and posteriorly with good breath sounds bilaterally. Abdominal:     General: Abdomen is flat. Bowel sounds are normal.      Palpations: Abdomen is soft. There is no mass.     Tenderness: There is no abdominal tenderness.     Comments: No liver or spleen enlargement.  No epigastric tenderness.  No masses no bruits.  Musculoskeletal:        General: No tenderness.     Right lower leg: No edema.     Left lower leg: No edema.     Comments: Some pain in low back and to the right low back with leg raising especially on the right side.  There is some tenderness to palpation.  Lymphadenopathy:     Cervical: No cervical  adenopathy.  Skin:    General: Skin is warm and dry.     Findings: No rash.  Neurological:     Mental Status: She is alert and oriented to person, place, and time. Mental status is at baseline.     Cranial Nerves: No cranial nerve deficit.     Deep Tendon Reflexes: Reflexes abnormal.     Comments: Reflexes were slightly decreased in the right lower extremity compared to the left.  Psychiatric:        Mood and Affect: Mood normal.        Behavior: Behavior normal.        Thought Content: Thought content normal.        Judgment: Judgment normal.     Comments: Mood affect and behavior are normal for this patient.    BP 125/83 (BP Location: Left Arm)   Pulse 86   Temp (!) 96.5 F (35.8 C) (Oral)   Ht 5' 3"  (1.6 m)   Wt 159 lb (72.1 kg)   BMI 28.17 kg/m         Assessment & Plan:  1. Essential hypertension -Blood pressure is good today and she will continue with current treatment - BMP8+EGFR - CBC with Differential/Platelet - Hepatic function panel  2. Mixed hyperlipidemia -Continue with Crestor treatment pending results of lab work and CK and liver function test - Lipid panel - CBC with Differential/Platelet  3. Hypothyroidism, unspecified type -Continue with current treatment pending results of thyroid test - CBC with Differential/Platelet - Thyroid Panel With TSH  4. Fibromyalgia - CBC with Differential/Platelet - Vitamin B12 - Magnesium  5. Vitamin D deficiency -Continue  with vitamin D replacement pending results of lab work - CBC with Differential/Platelet - VITAMIN D 25 Hydroxy (Vit-D Deficiency, Fractures)  6. Muscle cramps - CBC with Differential/Platelet - Vitamin B12 - Magnesium - CK - Sedimentation rate  7. 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  9. Chronically dry eyes, right -Continue to follow-up with ophthalmology  Meds ordered this encounter  Medications  . clonazePAM (KLONOPIN) 0.5 MG tablet    Sig: Take 1 tablet (0.5 mg total) by mouth 3 (three) times daily as needed.    Dispense:  90 tablet    Refill:  5   Patient Instructions  Continue current medications. Continue good therapeutic lifestyle changes which include good diet and exercise. Fall precautions discussed with patient. If an FOBT was given today- please return it to our front desk. If you are over 101 years old - you may need Prevnar 24 or the adult Pneumonia vaccine.  **Flu shots are available--- please call and schedule a FLU-CLINIC appointment**  After your visit with [place] today you will receive a survey in the mail or online from [employer] regarding your care with [place]. Please take a moment to fill this out. Your feedback is very important to [place] as you can help [place] better understand your patient needs as well as improve your experience and satisfaction. WE CARE ABOUT YOU!!!   Avoid aggressive exercising in the next week or so and only get out and walk on a regular basis with a good pair shoes Use warm wet compresses  to the back and take extra strength Tylenol if needed for pain Keep the house as cool as possible Continue to drink plenty of water and fluids If the  back pain continues, call us and we will make arrangements for you to see Dr. Herma Mering and orthopedic specialist with [facility].  Arrie Senate MD

## 2018-02-19 ENCOUNTER — Other Ambulatory Visit: Payer: Self-pay | Admitting: Family Medicine

## 2018-02-19 LAB — BMP8+EGFR
BUN/Creatinine Ratio: 15 (ref 12–28)
BUN: 15 mg/dL (ref 8–27)
CO2: 23 mmol/L (ref 20–29)
CREATININE: 1.03 mg/dL — AB (ref 0.57–1.00)
Calcium: 9.4 mg/dL (ref 8.7–10.3)
Chloride: 98 mmol/L (ref 96–106)
GFR calc Af Amer: 66 mL/min/{1.73_m2} (ref >59)
GFR, EST NON AFRICAN AMERICAN: 58 mL/min/{1.73_m2} — AB (ref >59)
Glucose: 80 mg/dL (ref 65–99)
POTASSIUM: 3.7 mmol/L (ref 3.5–5.2)
SODIUM: 138 mmol/L (ref 134–144)

## 2018-02-19 LAB — CBC WITH DIFFERENTIAL/PLATELET
BASOS: 1 %
Basophils Absolute: 0 10*3/uL (ref 0.0–0.2)
EOS (ABSOLUTE): 0.2 10*3/uL (ref 0.0–0.4)
EOS: 3 %
HEMATOCRIT: 39.8 % (ref 34.0–46.6)
Hemoglobin: 13 g/dL (ref 11.1–15.9)
IMMATURE GRANS (ABS): 0 10*3/uL (ref 0.0–0.1)
IMMATURE GRANULOCYTES: 0 %
Lymphocytes Absolute: 1.1 10*3/uL (ref 0.7–3.1)
Lymphs: 17 %
MCH: 28.2 pg (ref 26.6–33.0)
MCHC: 32.7 g/dL (ref 31.5–35.7)
MCV: 86 fL (ref 79–97)
MONOS ABS: 0.5 10*3/uL (ref 0.1–0.9)
Monocytes: 7 %
NEUTROS ABS: 4.6 10*3/uL (ref 1.4–7.0)
NEUTROS PCT: 72 %
Platelets: 294 10*3/uL (ref 150–450)
RBC: 4.61 x10E6/uL (ref 3.77–5.28)
RDW: 13.2 % (ref 12.3–15.4)
WBC: 6.4 10*3/uL (ref 3.4–10.8)

## 2018-02-19 LAB — THYROID PANEL WITH TSH
FREE THYROXINE INDEX: 2.4 (ref 1.2–4.9)
T3 Uptake Ratio: 27 % (ref 24–39)
T4, Total: 9 ug/dL (ref 4.5–12.0)
TSH: 0.688 u[IU]/mL (ref 0.450–4.500)

## 2018-02-19 LAB — HEPATIC FUNCTION PANEL
ALBUMIN: 4.6 g/dL (ref 3.6–4.8)
ALK PHOS: 84 IU/L (ref 39–117)
ALT: 17 IU/L (ref 0–32)
AST: 17 IU/L (ref 0–40)
BILIRUBIN TOTAL: 0.5 mg/dL (ref 0.0–1.2)
Bilirubin, Direct: 0.16 mg/dL (ref 0.00–0.40)
Total Protein: 6.9 g/dL (ref 6.0–8.5)

## 2018-02-19 LAB — VITAMIN B12: Vitamin B-12: 792 pg/mL (ref 232–1245)

## 2018-02-19 LAB — LIPID PANEL
CHOL/HDL RATIO: 2.9 ratio (ref 0.0–4.4)
CHOLESTEROL TOTAL: 152 mg/dL (ref 100–199)
HDL: 52 mg/dL (ref >39)
LDL CALC: 81 mg/dL (ref 0–99)
TRIGLYCERIDES: 94 mg/dL (ref 0–149)
VLDL Cholesterol Cal: 19 mg/dL (ref 5–40)

## 2018-02-19 LAB — CK: Total CK: 76 U/L (ref 24–173)

## 2018-02-19 LAB — MAGNESIUM: Magnesium: 2.2 mg/dL (ref 1.6–2.3)

## 2018-02-19 LAB — VITAMIN D 25 HYDROXY (VIT D DEFICIENCY, FRACTURES): Vit D, 25-Hydroxy: 70.2 ng/mL (ref 30.0–100.0)

## 2018-02-19 LAB — SEDIMENTATION RATE: SED RATE: 8 mm/h (ref 0–40)

## 2018-02-19 NOTE — Telephone Encounter (Signed)
Last Vit D 08/21/17  70.1

## 2018-03-14 ENCOUNTER — Other Ambulatory Visit: Payer: Self-pay | Admitting: Family Medicine

## 2018-03-14 ENCOUNTER — Ambulatory Visit: Payer: Self-pay | Admitting: Registered Dietitian

## 2018-03-14 NOTE — Interdisciplinary (Signed)
Consult to start back for 4 weeks.  Wearing home heart monitor.  Will begin [facility] class on Mondays at 10 am. Couldn't do [employer] today.  Will weigh in on Monday. Has blood work at home.  Sister passed recently from brain cancer

## 2018-03-26 ENCOUNTER — Ambulatory Visit
Admission: RE | Admit: 2018-03-26 | Discharge: 2018-03-26 | Disposition: A | Discharge status: Home / Self Care | Payer: BC Managed Care – PPO | Source: Ambulatory Visit | Attending: Family Medicine | Admitting: Family Medicine

## 2018-03-26 ENCOUNTER — Other Ambulatory Visit: Payer: Self-pay | Admitting: Family Medicine

## 2018-03-26 DIAGNOSIS — Z1231 Encounter for screening mammogram for malignant neoplasm of breast: Secondary | ICD-10-CM

## 2018-03-31 ENCOUNTER — Encounter: Payer: Self-pay | Admitting: Registered Dietitian

## 2018-04-07 ENCOUNTER — Ambulatory Visit: Payer: Self-pay | Admitting: Registered Dietitian

## 2018-04-07 NOTE — Interdisciplinary (Signed)
Just go released to leave the home   Was very ill

## 2018-04-13 ENCOUNTER — Other Ambulatory Visit: Payer: Self-pay | Admitting: Family Medicine

## 2018-04-14 ENCOUNTER — Other Ambulatory Visit: Payer: Self-pay | Admitting: Family Medicine

## 2018-04-14 ENCOUNTER — Ambulatory Visit: Payer: Self-pay | Admitting: Registered Dietitian

## 2018-04-14 NOTE — Interdisciplinary (Signed)
Has been sick.  Has lost 8 lbs.

## 2018-04-17 ENCOUNTER — Encounter: Payer: Self-pay | Admitting: Registered Dietitian

## 2018-04-28 ENCOUNTER — Encounter: Payer: Self-pay | Admitting: Registered"

## 2018-05-05 ENCOUNTER — Ambulatory Visit: Payer: Self-pay | Admitting: Registered Dietitian

## 2018-05-05 NOTE — Interdisciplinary (Signed)
DID ATTEND BUT DID NOT WEIGH IN.

## 2018-05-12 ENCOUNTER — Ambulatory Visit: Payer: Self-pay | Admitting: Registered Dietitian

## 2018-05-12 NOTE — Interdisciplinary (Signed)
No data but did attend

## 2018-05-19 ENCOUNTER — Encounter: Payer: Self-pay | Admitting: Registered Dietitian

## 2018-05-20 ENCOUNTER — Encounter: Payer: Self-pay | Admitting: Registered"

## 2018-05-26 ENCOUNTER — Encounter: Payer: Self-pay | Admitting: Registered Dietitian

## 2018-05-27 ENCOUNTER — Encounter: Payer: Self-pay | Admitting: Registered"

## 2018-06-02 ENCOUNTER — Encounter: Payer: Self-pay | Admitting: Registered Dietitian

## 2018-06-03 ENCOUNTER — Encounter: Payer: Self-pay | Admitting: Registered"

## 2018-06-04 ENCOUNTER — Other Ambulatory Visit: Payer: Self-pay | Admitting: Family Medicine

## 2018-06-09 ENCOUNTER — Encounter: Payer: Self-pay | Admitting: Registered Dietitian

## 2018-06-10 ENCOUNTER — Encounter: Payer: Self-pay | Admitting: Registered"

## 2018-06-16 ENCOUNTER — Encounter: Payer: Self-pay | Admitting: Registered Dietitian

## 2018-06-17 ENCOUNTER — Encounter: Payer: Self-pay | Admitting: Registered"

## 2018-06-23 ENCOUNTER — Encounter: Payer: Self-pay | Admitting: Registered Dietitian

## 2018-06-24 ENCOUNTER — Encounter: Payer: Self-pay | Admitting: Registered"

## 2018-06-30 ENCOUNTER — Encounter: Payer: Self-pay | Admitting: Registered Dietitian

## 2018-07-01 ENCOUNTER — Encounter: Payer: Self-pay | Admitting: Registered"

## 2018-07-07 ENCOUNTER — Encounter: Payer: Self-pay | Admitting: Registered Dietitian

## 2018-07-08 ENCOUNTER — Encounter: Payer: Self-pay | Admitting: Registered"

## 2018-07-14 ENCOUNTER — Encounter: Payer: Self-pay | Admitting: Registered Dietitian

## 2018-07-15 ENCOUNTER — Encounter: Payer: Self-pay | Admitting: Registered"

## 2018-07-21 ENCOUNTER — Encounter: Payer: Self-pay | Admitting: Registered Dietitian

## 2018-07-22 ENCOUNTER — Encounter: Payer: Self-pay | Admitting: Registered"

## 2018-07-29 ENCOUNTER — Encounter: Payer: Self-pay | Admitting: Registered"

## 2018-08-04 ENCOUNTER — Ambulatory Visit (INDEPENDENT_AMBULATORY_CARE_PROVIDER_SITE_OTHER): Payer: Medicare Other | Admitting: Family Medicine

## 2018-08-04 ENCOUNTER — Other Ambulatory Visit: Payer: Self-pay

## 2018-08-04 ENCOUNTER — Encounter: Payer: Self-pay | Admitting: Family Medicine

## 2018-08-04 ENCOUNTER — Encounter: Payer: Self-pay | Admitting: Registered Dietitian

## 2018-08-04 DIAGNOSIS — E782 Mixed hyperlipidemia: Secondary | ICD-10-CM

## 2018-08-04 DIAGNOSIS — E039 Hypothyroidism, unspecified: Secondary | ICD-10-CM | POA: Diagnosis not present

## 2018-08-04 DIAGNOSIS — I10 Essential (primary) hypertension: Secondary | ICD-10-CM | POA: Diagnosis not present

## 2018-08-04 DIAGNOSIS — E559 Vitamin D deficiency, unspecified: Secondary | ICD-10-CM | POA: Diagnosis not present

## 2018-08-04 DIAGNOSIS — M47816 Spondylosis without myelopathy or radiculopathy, lumbar region: Secondary | ICD-10-CM

## 2018-08-04 DIAGNOSIS — F410 Panic disorder [episodic paroxysmal anxiety] without agoraphobia: Secondary | ICD-10-CM

## 2018-08-04 DIAGNOSIS — M797 Fibromyalgia: Secondary | ICD-10-CM

## 2018-08-04 DIAGNOSIS — J9801 Acute bronchospasm: Secondary | ICD-10-CM

## 2018-08-04 DIAGNOSIS — Z1211 Encounter for screening for malignant neoplasm of colon: Secondary | ICD-10-CM

## 2018-08-04 MED ORDER — ALBUTEROL SULFATE HFA 108 (90 BASE) MCG/ACT IN AERS: 2.0000 | INHALATION_SPRAY | Freq: Four times a day (QID) | RESPIRATORY_TRACT | 0 refills | Status: DC | PRN
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
## 2018-08-04 NOTE — Addendum Note (Signed)
Addended by: Zannie Cove on: 08/04/2018 02:18 PM   Modules accepted: Orders

## 2018-08-04 NOTE — Patient Instructions (Addendum)
Please call the nurse if she does not call you first Come to the office for routine lab work plus chest x-ray We will call in a prescription for an albuterol to use as a rescue inhaler that should be used 1 puff every 4-6 hours if needed Drink plenty of fluids and stay well-hydrated Continue to practice good hand and respiratory hygiene If the previous FOBT that was turned and cannot be located with the results, you will need to be given another FOBT card to resubmit. We will call with blood work results as soon as these results become available Continue to avoid caffeine intake

## 2018-08-05 ENCOUNTER — Other Ambulatory Visit: Payer: Self-pay | Admitting: *Deleted

## 2018-08-05 ENCOUNTER — Other Ambulatory Visit: Payer: Self-pay | Admitting: Family Medicine

## 2018-08-05 ENCOUNTER — Telehealth: Payer: Self-pay | Admitting: Family Medicine

## 2018-08-05 ENCOUNTER — Encounter: Payer: Self-pay | Admitting: Registered"

## 2018-08-05 DIAGNOSIS — E039 Hypothyroidism, unspecified: Secondary | ICD-10-CM

## 2018-08-05 NOTE — Telephone Encounter (Signed)
Pt come tomorrow for labs

## 2018-08-06 ENCOUNTER — Other Ambulatory Visit (INDEPENDENT_AMBULATORY_CARE_PROVIDER_SITE_OTHER): Payer: Medicare Other

## 2018-08-06 ENCOUNTER — Other Ambulatory Visit: Payer: Self-pay | Admitting: Family Medicine

## 2018-08-06 ENCOUNTER — Other Ambulatory Visit: Payer: Self-pay

## 2018-08-06 ENCOUNTER — Other Ambulatory Visit: Payer: Medicare Other

## 2018-08-06 ENCOUNTER — Ambulatory Visit: Payer: Self-pay | Admitting: Registered"

## 2018-08-06 DIAGNOSIS — E559 Vitamin D deficiency, unspecified: Secondary | ICD-10-CM

## 2018-08-06 DIAGNOSIS — R05 Cough: Secondary | ICD-10-CM

## 2018-08-06 DIAGNOSIS — I10 Essential (primary) hypertension: Secondary | ICD-10-CM

## 2018-08-06 DIAGNOSIS — Z1211 Encounter for screening for malignant neoplasm of colon: Secondary | ICD-10-CM

## 2018-08-06 DIAGNOSIS — E039 Hypothyroidism, unspecified: Secondary | ICD-10-CM

## 2018-08-06 DIAGNOSIS — R059 Cough, unspecified: Secondary | ICD-10-CM

## 2018-08-06 DIAGNOSIS — J9801 Acute bronchospasm: Secondary | ICD-10-CM

## 2018-08-06 DIAGNOSIS — F410 Panic disorder [episodic paroxysmal anxiety] without agoraphobia: Secondary | ICD-10-CM

## 2018-08-06 DIAGNOSIS — E782 Mixed hyperlipidemia: Secondary | ICD-10-CM

## 2018-08-06 DIAGNOSIS — M797 Fibromyalgia: Secondary | ICD-10-CM

## 2018-08-06 DIAGNOSIS — M47816 Spondylosis without myelopathy or radiculopathy, lumbar region: Secondary | ICD-10-CM

## 2018-08-06 LAB — LIPID PANEL

## 2018-08-06 NOTE — Interdisciplinary (Signed)
Met w/ pt for In[employer] composition analysis and results review.  Pt also doing Noom while in the program which she states helps with mindful/emotional eating.  Overall, doing well and motivated to continue losing weight.  This was her first scan. Pt bought 4 pack.  Discussed calorie deficit/wt loss basics.  Encouraged strength training to ensure holding on to muscle and keeping metabolism boosted.    Allen Kell, [facility]    [phone]  [email or web address]

## 2018-08-07 LAB — LIPID PANEL
Chol/HDL Ratio: 3.1 ratio (ref 0.0–4.4)
Cholesterol, Total: 167 mg/dL (ref 100–199)
HDL: 54 mg/dL (ref >39)
LDL Calculated: 96 mg/dL (ref 0–99)
Triglycerides: 85 mg/dL (ref 0–149)
VLDL Cholesterol Cal: 17 mg/dL (ref 5–40)

## 2018-08-07 LAB — CBC WITH DIFFERENTIAL/PLATELET
Basophils Absolute: 0 10*3/uL (ref 0.0–0.2)
Basos: 1 %
EOS (ABSOLUTE): 0.4 10*3/uL (ref 0.0–0.4)
Eos: 8 %
Hematocrit: 39.2 % (ref 34.0–46.6)
Hemoglobin: 13.3 g/dL (ref 11.1–15.9)
Immature Grans (Abs): 0 10*3/uL (ref 0.0–0.1)
Immature Granulocytes: 0 %
Lymphocytes Absolute: 1.3 10*3/uL (ref 0.7–3.1)
Lymphs: 26 %
MCH: 28.4 pg (ref 26.6–33.0)
MCHC: 33.9 g/dL (ref 31.5–35.7)
MCV: 84 fL (ref 79–97)
Monocytes Absolute: 0.4 10*3/uL (ref 0.1–0.9)
Monocytes: 9 %
Neutrophils Absolute: 2.8 10*3/uL (ref 1.4–7.0)
Neutrophils: 56 %
Platelets: 290 10*3/uL (ref 150–450)
RBC: 4.69 x10E6/uL (ref 3.77–5.28)
RDW: 13.2 % (ref 11.7–15.4)
WBC: 5 10*3/uL (ref 3.4–10.8)

## 2018-08-07 LAB — HEPATIC FUNCTION PANEL
ALT: 13 IU/L (ref 0–32)
AST: 15 IU/L (ref 0–40)
Albumin: 4.5 g/dL (ref 3.8–4.8)
Alkaline Phosphatase: 76 IU/L (ref 39–117)
Bilirubin Total: 0.4 mg/dL (ref 0.0–1.2)
Bilirubin, Direct: 0.1 mg/dL (ref 0.00–0.40)
Total Protein: 6.7 g/dL (ref 6.0–8.5)

## 2018-08-07 LAB — BMP8+EGFR
BUN/Creatinine Ratio: 15 (ref 12–28)
BUN: 17 mg/dL (ref 8–27)
CO2: 22 mmol/L (ref 20–29)
Calcium: 9.2 mg/dL (ref 8.7–10.3)
Chloride: 102 mmol/L (ref 96–106)
Creatinine, Ser: 1.11 mg/dL — ABNORMAL HIGH (ref 0.57–1.00)
GFR calc Af Amer: 60 mL/min/{1.73_m2} (ref >59)
GFR calc non Af Amer: 52 mL/min/{1.73_m2} — ABNORMAL LOW (ref >59)
Glucose: 91 mg/dL (ref 65–99)
Potassium: 3.8 mmol/L (ref 3.5–5.2)
Sodium: 141 mmol/L (ref 134–144)

## 2018-08-07 LAB — THYROID PANEL WITH TSH
Free Thyroxine Index: 1.9 (ref 1.2–4.9)
T3 Uptake Ratio: 25 % (ref 24–39)
T4, Total: 7.5 ug/dL (ref 4.5–12.0)
TSH: 0.79 u[IU]/mL (ref 0.450–4.500)

## 2018-08-07 LAB — VITAMIN D 25 HYDROXY (VIT D DEFICIENCY, FRACTURES): Vit D, 25-Hydroxy: 71.8 ng/mL (ref 30.0–100.0)

## 2018-08-11 ENCOUNTER — Encounter: Payer: Self-pay | Admitting: Registered Dietitian

## 2018-08-12 ENCOUNTER — Encounter: Payer: Self-pay | Admitting: Registered"

## 2018-08-19 ENCOUNTER — Ambulatory Visit: Payer: BC Managed Care – PPO | Admitting: Family Medicine

## 2018-08-19 ENCOUNTER — Encounter: Payer: Self-pay | Admitting: Registered"

## 2018-08-23 ENCOUNTER — Other Ambulatory Visit: Payer: Self-pay | Admitting: Family Medicine

## 2018-08-26 ENCOUNTER — Encounter: Payer: Self-pay | Admitting: Registered"

## 2018-08-29 ENCOUNTER — Other Ambulatory Visit: Payer: Self-pay | Admitting: Family Medicine

## 2018-09-02 ENCOUNTER — Encounter: Payer: Self-pay | Admitting: Registered"

## 2018-09-09 ENCOUNTER — Encounter: Payer: Self-pay | Admitting: Registered"

## 2018-09-16 ENCOUNTER — Encounter: Payer: Self-pay | Admitting: Registered"

## 2018-09-23 ENCOUNTER — Encounter: Payer: Self-pay | Admitting: Registered"

## 2018-09-30 ENCOUNTER — Encounter: Payer: Self-pay | Admitting: Registered"

## 2018-10-07 ENCOUNTER — Encounter: Payer: Self-pay | Admitting: Registered"

## 2018-10-14 ENCOUNTER — Encounter: Payer: Self-pay | Admitting: Registered"

## 2018-10-20 ENCOUNTER — Other Ambulatory Visit: Payer: Self-pay | Admitting: Family Medicine

## 2018-10-21 ENCOUNTER — Encounter: Payer: Self-pay | Admitting: Registered"

## 2018-10-28 ENCOUNTER — Encounter: Payer: Self-pay | Admitting: Registered"

## 2018-11-04 ENCOUNTER — Encounter: Payer: Self-pay | Admitting: Registered"

## 2018-11-11 ENCOUNTER — Encounter: Payer: Self-pay | Admitting: Registered"

## 2018-11-18 ENCOUNTER — Encounter: Payer: Self-pay | Admitting: Registered"

## 2018-11-25 ENCOUNTER — Encounter: Payer: Self-pay | Admitting: Registered"

## 2018-12-01 ENCOUNTER — Other Ambulatory Visit: Payer: Self-pay | Admitting: Family Medicine

## 2018-12-02 ENCOUNTER — Encounter: Payer: Self-pay | Admitting: Registered"

## 2018-12-09 ENCOUNTER — Encounter: Payer: Self-pay | Admitting: Registered"

## 2018-12-17 IMAGING — MG 2D DIGITAL SCREENING BILATERAL MAMMOGRAM WITH CAD AND ADJUNCT TO
8 of 12 series · 8 of 28 positions shown · non-contrast
Comparison: Previous exam(s).

CLINICAL DATA: Screening.

EXAM:
2D DIGITAL SCREENING BILATERAL MAMMOGRAM WITH CAD AND ADJUNCT TOMO

[L MLO]
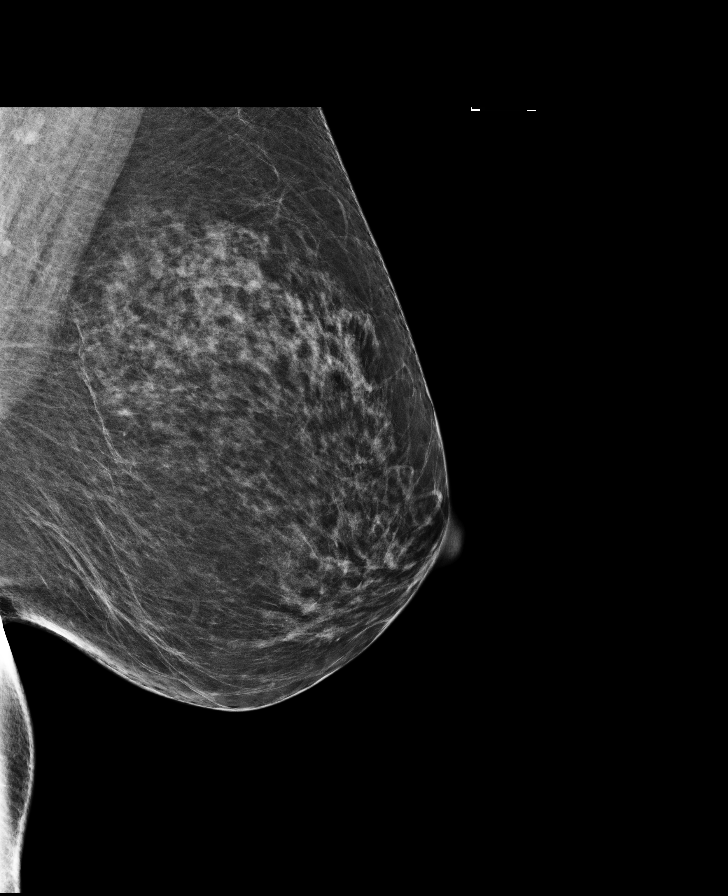

[R CC]
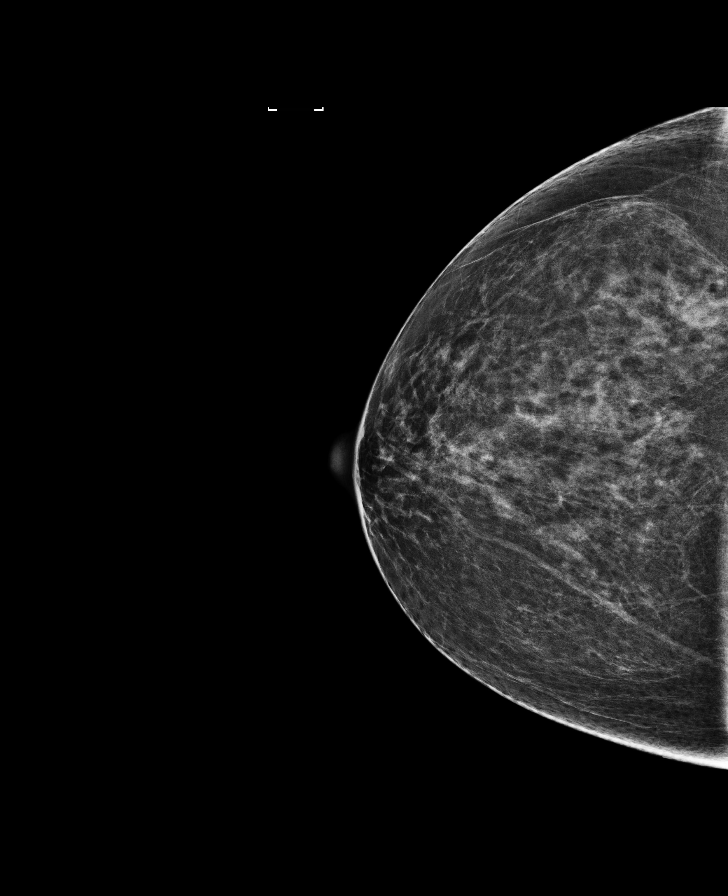

[R CC synth-2D]
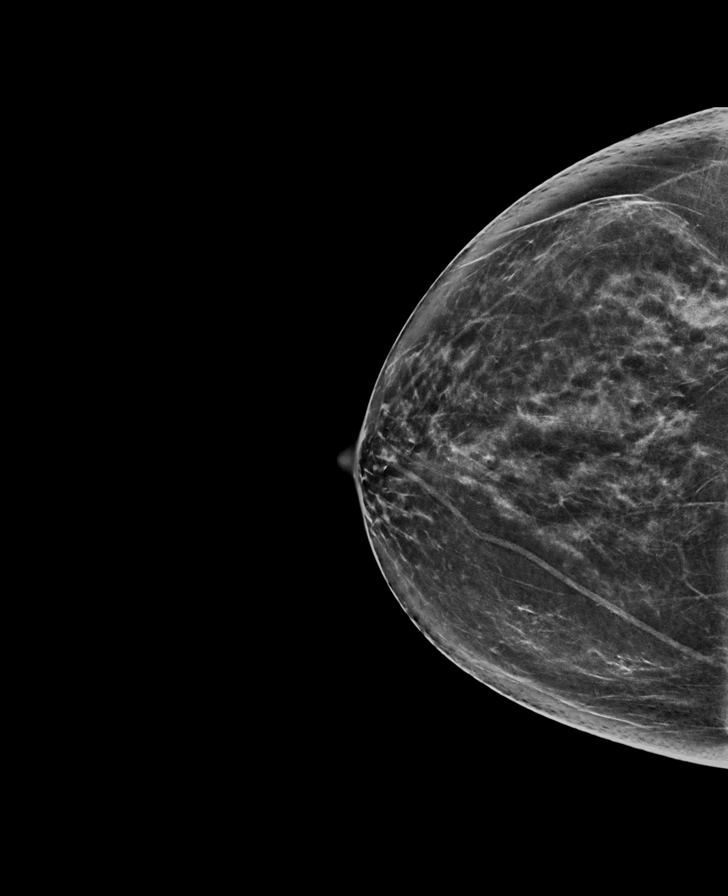

[L CC synth-2D]
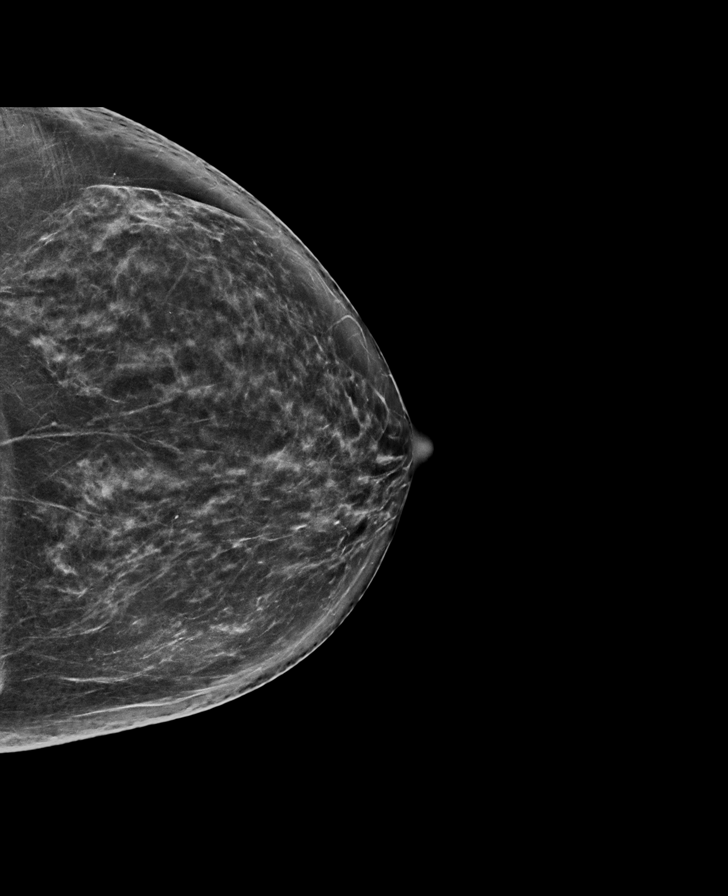

[L MLO synth-2D]
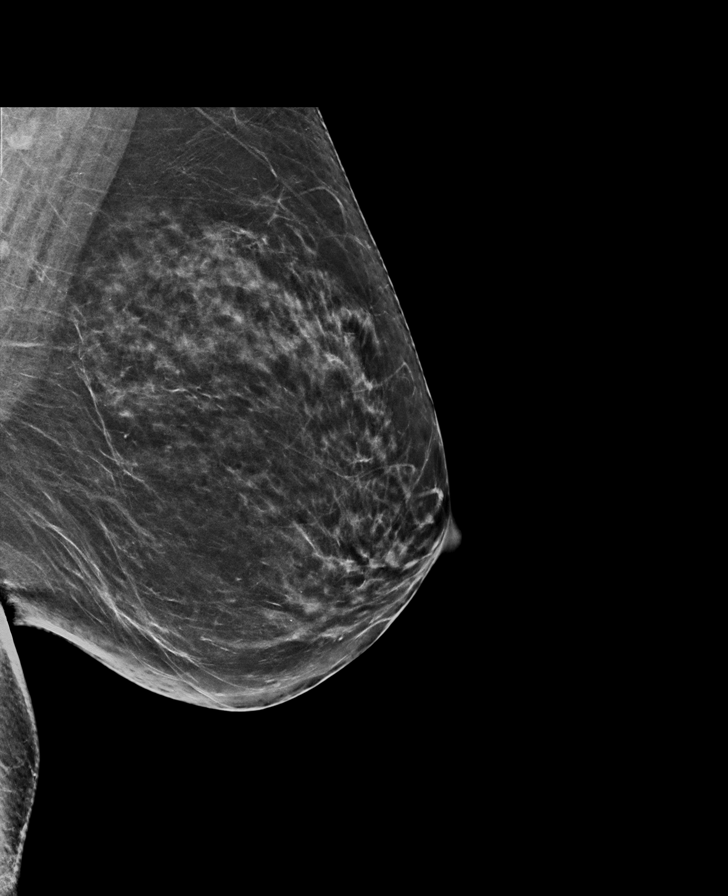

[R MLO synth-2D]
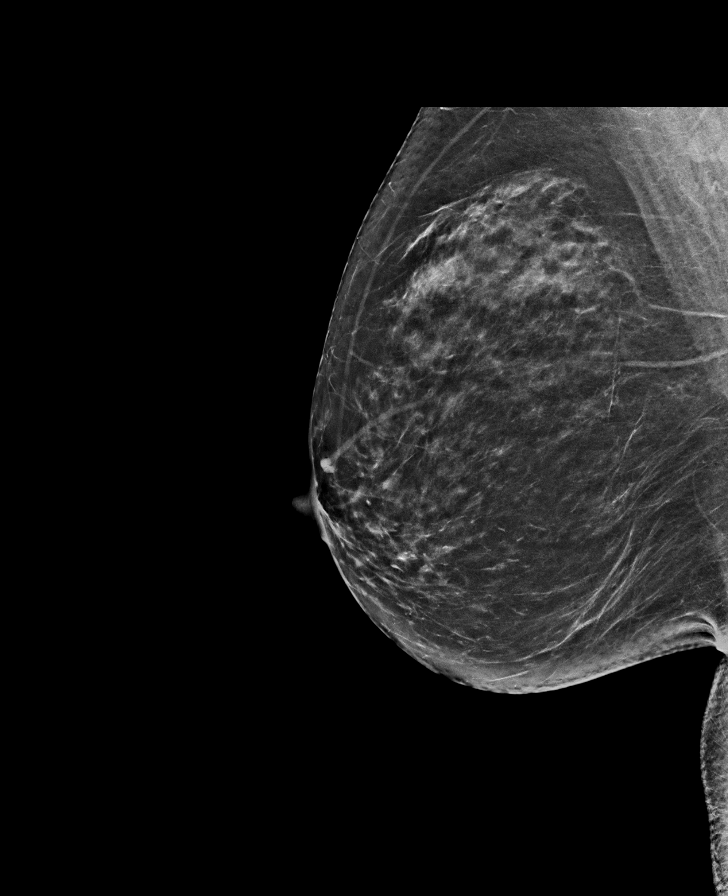

[R MLO]
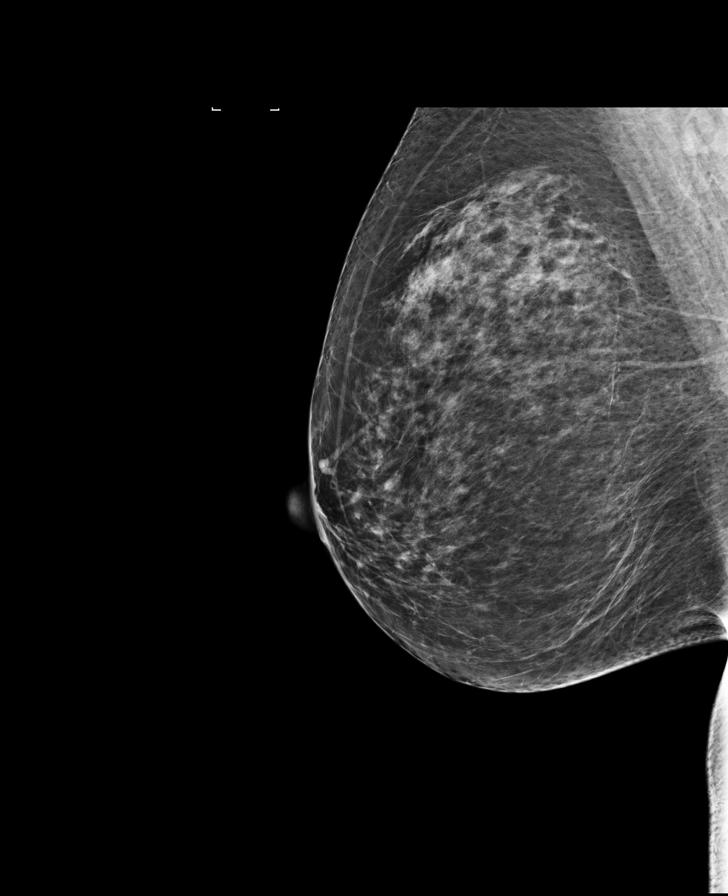

[L CC]
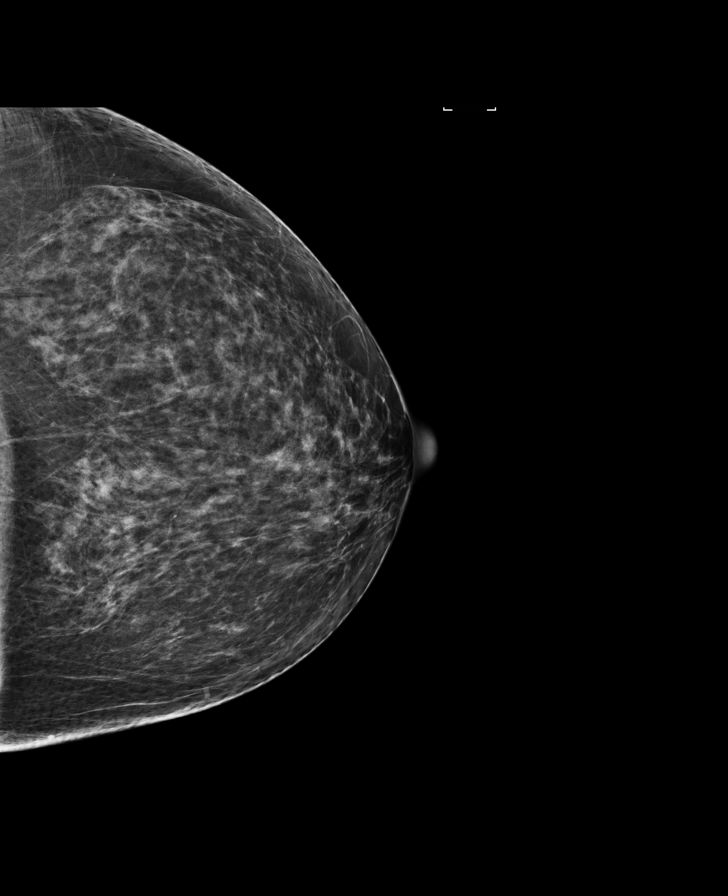

[8 of 28 positions shown; findings below may reference images not displayed]

ACR Breast Density Category b: There are scattered areas of
fibroglandular density.
FINDINGS: There are no findings suspicious for malignancy. Images were
processed with CAD.
IMPRESSION: No mammographic evidence of malignancy. A result letter of this
screening mammogram will be mailed directly to the patient.

RECOMMENDATION:
Screening mammogram in one year. (Code:97-6-RS4)

BI-RADS CATEGORY  1: Negative.

## 2019-02-18 ENCOUNTER — Other Ambulatory Visit: Payer: Self-pay | Admitting: Family Medicine

## 2019-03-02 ENCOUNTER — Other Ambulatory Visit: Payer: Self-pay | Admitting: Family Medicine

## 2019-04-18 ENCOUNTER — Other Ambulatory Visit: Payer: Self-pay | Admitting: Family

## 2019-04-22 ENCOUNTER — Other Ambulatory Visit: Payer: Self-pay | Admitting: Family Medicine

## 2019-04-30 ENCOUNTER — Other Ambulatory Visit: Payer: Self-pay | Admitting: Family Medicine

## 2019-05-02 ENCOUNTER — Other Ambulatory Visit: Payer: Self-pay | Admitting: Family Medicine

## 2019-05-08 ENCOUNTER — Other Ambulatory Visit: Payer: Self-pay

## 2019-05-11 ENCOUNTER — Ambulatory Visit: Payer: Medicare Other | Admitting: Family Medicine

## 2019-05-14 ENCOUNTER — Other Ambulatory Visit: Payer: Self-pay

## 2019-05-15 ENCOUNTER — Encounter: Payer: Self-pay | Admitting: Family Medicine

## 2019-05-15 ENCOUNTER — Ambulatory Visit: Payer: Medicare PPO | Admitting: Family Medicine

## 2019-05-15 VITALS — BP 124/78 | HR 79 | Temp 98.9°F | Ht 63.0 in | Wt 158.0 lb

## 2019-05-15 DIAGNOSIS — R079 Chest pain, unspecified: Secondary | ICD-10-CM | POA: Diagnosis not present

## 2019-05-15 DIAGNOSIS — E039 Hypothyroidism, unspecified: Secondary | ICD-10-CM | POA: Diagnosis not present

## 2019-05-15 DIAGNOSIS — I10 Essential (primary) hypertension: Secondary | ICD-10-CM | POA: Diagnosis not present

## 2019-05-15 DIAGNOSIS — E559 Vitamin D deficiency, unspecified: Secondary | ICD-10-CM

## 2019-05-15 DIAGNOSIS — F419 Anxiety disorder, unspecified: Secondary | ICD-10-CM

## 2019-05-15 DIAGNOSIS — J4520 Mild intermittent asthma, uncomplicated: Secondary | ICD-10-CM

## 2019-05-15 DIAGNOSIS — E663 Overweight: Secondary | ICD-10-CM

## 2019-05-15 MED ORDER — BUDESONIDE-FORMOTEROL FUMARATE 80-4.5 MCG/ACT IN AERO: 2.0000 | INHALATION_SPRAY | Freq: Two times a day (BID) | RESPIRATORY_TRACT | 3 refills | Status: DC

## 2019-05-15 MED ORDER — VITAMIN D (ERGOCALCIFEROL) 1.25 MG (50000 UNIT) PO CAPS: ORAL_CAPSULE | ORAL | 6 refills | Status: DC

## 2019-05-15 MED ORDER — VORTIOXETINE HBR 5 MG PO TABS: 5.0000 mg | ORAL_TABLET | Freq: Every day | ORAL | 6 refills | Status: DC

## 2019-05-15 MED ORDER — NITROGLYCERIN 0.4 MG SL SUBL: 0.4000 mg | SUBLINGUAL_TABLET | SUBLINGUAL | 0 refills | Status: DC | PRN

## 2019-05-15 NOTE — Patient Instructions (Signed)
If your symptoms worsen or you have thoughts of suicide/homicide, PLEASE SEEK IMMEDIATE MEDICAL ATTENTION.  You may always call the National Suicide Hotline.  This is available 24 hours a day, 7 days a week.  Their number is: 1-800-273-8255  Taking the medicine as directed and not missing any doses is one of the best things you can do to treat your depression.  Here are some things to keep in mind:  1) Side effects (stomach upset, some increased anxiety) may happen before you notice a benefit.  These side effects typically go away over time. 2) Changes to your dose of medicine or a change in medication all together is sometimes necessary 3) Most people need to be on medication at least 12 months 4) Many people will notice an improvement within two weeks but the full effect of the medication can take up to 4-6 weeks 5) Stopping the medication when you start feeling better often results in a return of symptoms 6) Never discontinue your medication without contacting a health care professional first.  Some medications require gradual discontinuation/ taper and can make you sick if you stop them abruptly.  If your symptoms worsen or you have thoughts of suicide/homicide, PLEASE SEEK IMMEDIATE MEDICAL ATTENTION.  You may always call:  National Suicide Hotline: 800-273-8255 [place] Crisis Line: 336-832-9700 Crisis Recovery in [place]: 800-939-5911   These are available 24 hours a day, 7 days a week.   
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
## 2019-05-16 ENCOUNTER — Other Ambulatory Visit: Payer: Self-pay | Admitting: Family

## 2019-05-16 LAB — CMP14+EGFR
ALT: 16 IU/L (ref 0–32)
AST: 16 IU/L (ref 0–40)
Albumin/Globulin Ratio: 1.7 (ref 1.2–2.2)
Albumin: 4.5 g/dL (ref 3.8–4.8)
Alkaline Phosphatase: 83 IU/L (ref 39–117)
BUN/Creatinine Ratio: 11 — ABNORMAL LOW (ref 12–28)
BUN: 10 mg/dL (ref 8–27)
Bilirubin Total: 0.4 mg/dL (ref 0.0–1.2)
CO2: 23 mmol/L (ref 20–29)
Calcium: 9.8 mg/dL (ref 8.7–10.3)
Chloride: 98 mmol/L (ref 96–106)
Creatinine, Ser: 0.94 mg/dL (ref 0.57–1.00)
GFR calc Af Amer: 73 mL/min/{1.73_m2} (ref >59)
GFR calc non Af Amer: 63 mL/min/{1.73_m2} (ref >59)
Globulin, Total: 2.7 g/dL (ref 1.5–4.5)
Glucose: 86 mg/dL (ref 65–99)
Potassium: 3.9 mmol/L (ref 3.5–5.2)
Sodium: 141 mmol/L (ref 134–144)
Total Protein: 7.2 g/dL (ref 6.0–8.5)

## 2019-05-16 LAB — LIPID PANEL
Chol/HDL Ratio: 3.2 ratio (ref 0.0–4.4)
Cholesterol, Total: 141 mg/dL (ref 100–199)
HDL: 44 mg/dL (ref >39)
LDL Chol Calc (NIH): 79 mg/dL (ref 0–99)
Triglycerides: 97 mg/dL (ref 0–149)
VLDL Cholesterol Cal: 18 mg/dL (ref 5–40)

## 2019-05-16 LAB — THYROID PANEL WITH TSH
Free Thyroxine Index: 2.6 (ref 1.2–4.9)
T3 Uptake Ratio: 30 % (ref 24–39)
T4, Total: 8.6 ug/dL (ref 4.5–12.0)
TSH: 1.96 u[IU]/mL (ref 0.450–4.500)

## 2019-05-16 LAB — CBC WITH DIFFERENTIAL/PLATELET
Basophils Absolute: 0 10*3/uL (ref 0.0–0.2)
Basos: 1 %
EOS (ABSOLUTE): 0.4 10*3/uL (ref 0.0–0.4)
Eos: 8 %
Hematocrit: 42.4 % (ref 34.0–46.6)
Hemoglobin: 13.5 g/dL (ref 11.1–15.9)
Immature Grans (Abs): 0 10*3/uL (ref 0.0–0.1)
Immature Granulocytes: 0 %
Lymphocytes Absolute: 1.1 10*3/uL (ref 0.7–3.1)
Lymphs: 21 %
MCH: 28.4 pg (ref 26.6–33.0)
MCHC: 31.8 g/dL (ref 31.5–35.7)
MCV: 89 fL (ref 79–97)
Monocytes Absolute: 0.4 10*3/uL (ref 0.1–0.9)
Monocytes: 7 %
Neutrophils Absolute: 3.2 10*3/uL (ref 1.4–7.0)
Neutrophils: 63 %
Platelets: 256 10*3/uL (ref 150–450)
RBC: 4.75 x10E6/uL (ref 3.77–5.28)
RDW: 13.3 % (ref 11.7–15.4)
WBC: 5.2 10*3/uL (ref 3.4–10.8)

## 2019-05-16 LAB — VITAMIN D 25 HYDROXY (VIT D DEFICIENCY, FRACTURES): Vit D, 25-Hydroxy: 74.1 ng/mL (ref 30.0–100.0)

## 2019-05-28 ENCOUNTER — Encounter: Payer: Self-pay | Admitting: Family Medicine

## 2019-05-28 ENCOUNTER — Other Ambulatory Visit: Payer: Self-pay | Admitting: Family Medicine

## 2019-05-28 DIAGNOSIS — J069 Acute upper respiratory infection, unspecified: Secondary | ICD-10-CM

## 2019-05-28 MED ORDER — AZITHROMYCIN 250 MG PO TABS: ORAL_TABLET | ORAL | 0 refills | Status: DC

## 2019-05-28 MED ORDER — PREDNISONE 20 MG PO TABS: ORAL_TABLET | ORAL | 0 refills | Status: DC

## 2019-05-29 ENCOUNTER — Encounter: Payer: Self-pay | Admitting: Family Medicine

## 2019-06-15 ENCOUNTER — Telehealth: Payer: Self-pay | Admitting: Family Medicine

## 2019-06-15 NOTE — Telephone Encounter (Signed)
Please send in name brand symbicort peer patient request.

## 2019-07-24 ENCOUNTER — Other Ambulatory Visit: Payer: Self-pay | Admitting: *Deleted

## 2019-07-24 MED ORDER — LEVOTHYROXINE SODIUM 112 MCG PO TABS: ORAL_TABLET | ORAL | 3 refills | Status: DC

## 2019-08-06 ENCOUNTER — Other Ambulatory Visit: Payer: Self-pay | Admitting: Family Medicine

## 2019-08-08 ENCOUNTER — Other Ambulatory Visit: Payer: Self-pay | Admitting: Family Medicine

## 2019-08-08 ENCOUNTER — Encounter: Payer: Self-pay | Admitting: Family Medicine

## 2019-08-26 ENCOUNTER — Other Ambulatory Visit: Payer: Self-pay | Admitting: Family

## 2019-08-26 ENCOUNTER — Other Ambulatory Visit: Payer: Self-pay | Admitting: Family Medicine

## 2019-08-26 DIAGNOSIS — Z1231 Encounter for screening mammogram for malignant neoplasm of breast: Secondary | ICD-10-CM

## 2019-08-30 ENCOUNTER — Other Ambulatory Visit: Payer: Self-pay | Admitting: Family Medicine

## 2019-09-01 ENCOUNTER — Ambulatory Visit
Admission: RE | Admit: 2019-09-01 | Discharge: 2019-09-01 | Disposition: A | Discharge status: Home / Self Care | Payer: Medicare PPO | Source: Ambulatory Visit | Attending: Family | Admitting: Family

## 2019-09-01 ENCOUNTER — Other Ambulatory Visit: Payer: Self-pay

## 2019-09-01 DIAGNOSIS — Z1231 Encounter for screening mammogram for malignant neoplasm of breast: Secondary | ICD-10-CM

## 2019-09-10 ENCOUNTER — Other Ambulatory Visit: Payer: Self-pay | Admitting: *Deleted

## 2019-09-10 MED ORDER — LOSARTAN POTASSIUM-HCTZ 50-12.5 MG PO TABS: 1.0000 | ORAL_TABLET | Freq: Every day | ORAL | 1 refills | Status: DC

## 2019-09-17 ENCOUNTER — Other Ambulatory Visit: Payer: Self-pay | Admitting: Family Medicine

## 2019-09-17 DIAGNOSIS — J4520 Mild intermittent asthma, uncomplicated: Secondary | ICD-10-CM

## 2019-10-22 ENCOUNTER — Other Ambulatory Visit: Payer: Self-pay | Admitting: Family Medicine

## 2019-11-13 ENCOUNTER — Ambulatory Visit: Payer: Medicare PPO | Admitting: Family Medicine

## 2019-11-13 ENCOUNTER — Encounter: Payer: Self-pay | Admitting: Family Medicine

## 2019-11-13 ENCOUNTER — Other Ambulatory Visit: Payer: Self-pay

## 2019-11-13 VITALS — BP 150/87 | HR 76 | Temp 97.9°F | Ht 63.0 in | Wt 158.6 lb

## 2019-11-13 DIAGNOSIS — R6882 Decreased libido: Secondary | ICD-10-CM

## 2019-11-13 DIAGNOSIS — F419 Anxiety disorder, unspecified: Secondary | ICD-10-CM

## 2019-11-13 DIAGNOSIS — J4520 Mild intermittent asthma, uncomplicated: Secondary | ICD-10-CM

## 2019-11-13 DIAGNOSIS — E890 Postprocedural hypothyroidism: Secondary | ICD-10-CM | POA: Diagnosis not present

## 2019-11-13 DIAGNOSIS — Z79899 Other long term (current) drug therapy: Secondary | ICD-10-CM

## 2019-11-13 DIAGNOSIS — Z7689 Persons encountering health services in other specified circumstances: Secondary | ICD-10-CM | POA: Diagnosis not present

## 2019-11-13 DIAGNOSIS — R197 Diarrhea, unspecified: Secondary | ICD-10-CM

## 2019-11-13 DIAGNOSIS — H9192 Unspecified hearing loss, left ear: Secondary | ICD-10-CM

## 2019-11-13 MED ORDER — BUDESONIDE-FORMOTEROL FUMARATE 80-4.5 MCG/ACT IN AERO: INHALATION_SPRAY | RESPIRATORY_TRACT | 3 refills | Status: DC

## 2019-11-13 MED ORDER — LORAZEPAM 0.5 MG PO TABS: 0.5000 mg | ORAL_TABLET | Freq: Two times a day (BID) | ORAL | 1 refills | Status: DC | PRN
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
## 2019-11-13 NOTE — Patient Instructions (Signed)

## 2019-11-17 ENCOUNTER — Other Ambulatory Visit: Payer: Self-pay | Admitting: Family Medicine

## 2019-11-17 LAB — TOXASSURE SELECT 13 (MW), URINE

## 2019-11-20 LAB — ALPHA-GAL PANEL
Alpha Gal IgE*: <0.1 kU/L (ref <0.10)
Beef (Bos spp) IgE: <0.1 kU/L (ref <0.35)
Class Interpretation: 0
Class Interpretation: 0
Class Interpretation: 0
Lamb/Mutton (Ovis spp) IgE: <0.1 kU/L (ref <0.35)
Pork (Sus spp) IgE: <0.1 kU/L (ref <0.35)

## 2019-11-20 LAB — THYROID PANEL WITH TSH
Free Thyroxine Index: 1.9 (ref 1.2–4.9)
T3 Uptake Ratio: 25 % (ref 24–39)
T4, Total: 7.7 ug/dL (ref 4.5–12.0)
TSH: 5.55 u[IU]/mL — ABNORMAL HIGH (ref 0.450–4.500)

## 2019-11-21 ENCOUNTER — Encounter: Payer: Self-pay | Admitting: Family Medicine

## 2019-11-23 ENCOUNTER — Other Ambulatory Visit: Payer: Self-pay | Admitting: *Deleted

## 2019-11-23 MED ORDER — LEVOTHYROXINE SODIUM 125 MCG PO TABS: 125.0000 ug | ORAL_TABLET | Freq: Every day | ORAL | 2 refills | Status: DC

## 2019-11-30 ENCOUNTER — Ambulatory Visit
Admission: RE | Admit: 2019-11-30 | Discharge: 2019-11-30 | Disposition: A | Discharge status: Home / Self Care | Payer: Medicare PPO | Source: Ambulatory Visit | Attending: Allergy | Admitting: Allergy

## 2019-11-30 ENCOUNTER — Other Ambulatory Visit: Payer: Self-pay | Admitting: Allergy

## 2019-11-30 DIAGNOSIS — R059 Cough, unspecified: Secondary | ICD-10-CM

## 2019-12-01 ENCOUNTER — Ambulatory Visit: Payer: Medicare PPO | Admitting: Audiologist

## 2019-12-02 ENCOUNTER — Other Ambulatory Visit: Payer: Self-pay | Admitting: Family Medicine

## 2019-12-02 ENCOUNTER — Ambulatory Visit: Payer: Medicare PPO | Attending: Audiologist | Admitting: Audiologist

## 2019-12-02 ENCOUNTER — Other Ambulatory Visit: Payer: Self-pay | Admitting: Family

## 2019-12-02 ENCOUNTER — Other Ambulatory Visit: Payer: Self-pay

## 2019-12-02 DIAGNOSIS — H9042 Sensorineural hearing loss, unilateral, left ear, with unrestricted hearing on the contralateral side: Secondary | ICD-10-CM | POA: Insufficient documentation

## 2019-12-02 DIAGNOSIS — IMO0001 Reserved for inherently not codable concepts without codable children: Secondary | ICD-10-CM

## 2019-12-02 NOTE — Procedures (Signed)
  Outpatient Audiology and [place] [place], [place]  [id number] [phone]  AUDIOLOGICAL  EVALUATION  NAME: Julie [name]     DOB:   September 04, 1955      MRN: [id number]                                                                                     DATE: 12/02/2019     REFERENT: Janora Norlander, DO STATUS: Outpatient DIAGNOSIS: Sensorineural hearing loss (SNHL) of left ear with unrestricted hearing of right ear    History: Julie [name] was seen for an audiological evaluation.  Julie [name] is receiving a hearing evaluation due to concerns for hearing loss in her left ear. Julie [name] has difficulty hearing in background noise, crowds, and when people are at a distance. This difficulty began suddenly after she had gallbladder surgery several years ago. After surgery she woke up with no hearing in the left ear. She then only heard crackling cricket noises in her left ear. The hearing then slowly returned.  No pain or pressure reported in either ear. Tinnitus present in both ears in intermittently. Christne has no significant history of noise exposure or significant family history of hearing loss.  Medical history negative for any risk factor for hearing loss. No other relevant case history reported.    Evaluation:   Otoscopy showed a clear view of the tympanic membranes, bilaterally  Tympanometry results were consistent with normal middle ear function bilaterally    Audiometric testing was completed using conventional audiometry with insert transducer. Speech Recognition Thresholds were consistent with pure tone averages. Word Recognition was good at conversation and an elevated level. Pure tone thresholds show normal hearing in the right ear and a moderate sensorineural  hearing loss after 1.5k Hz in the left ear. Test results are consistent with patient report of asymmetric hearing loss.   Results:  The test results were reviewed with Julie [name]. She was counseled  on the nature and degree of her hearing loss. [name] needs to see an ENT Physician.   She reported understanding, and was given two copies of her audiogram.   Recommendations: 1. Referral to ENT Physician necessary due to asymmetric hearing loss with left ear worse.  2. Janora Norlander, DO please send a referral to Dr. Lucia Gaskins, ENT, for a medical evaluation of asymmetric hearing loss.    Alfonse Alpers  Audiologist, Au.D., CCC-A 12/02/2019  10:52 AM  Cc: Janora Norlander, DO

## 2019-12-03 ENCOUNTER — Telehealth: Payer: Self-pay | Admitting: Family Medicine

## 2019-12-03 ENCOUNTER — Telehealth: Payer: Self-pay

## 2019-12-03 NOTE — Telephone Encounter (Signed)
LMTCB, pt is to ask for [facility]

## 2019-12-03 NOTE — Telephone Encounter (Signed)
appt made for 12/08/19, pt aware

## 2019-12-03 NOTE — Telephone Encounter (Signed)
appt made for 12/08/19 pt aware

## 2019-12-08 ENCOUNTER — Other Ambulatory Visit: Payer: Self-pay

## 2019-12-08 ENCOUNTER — Ambulatory Visit: Payer: Medicare PPO | Admitting: Family Medicine

## 2019-12-08 ENCOUNTER — Encounter: Payer: Self-pay | Admitting: Family Medicine

## 2019-12-08 VITALS — BP 149/85 | HR 77 | Temp 97.5°F | Ht 63.0 in | Wt 156.2 lb

## 2019-12-08 DIAGNOSIS — F4310 Post-traumatic stress disorder, unspecified: Secondary | ICD-10-CM

## 2019-12-08 DIAGNOSIS — F330 Major depressive disorder, recurrent, mild: Secondary | ICD-10-CM | POA: Diagnosis not present

## 2019-12-08 DIAGNOSIS — F419 Anxiety disorder, unspecified: Secondary | ICD-10-CM

## 2019-12-08 DIAGNOSIS — F410 Panic disorder [episodic paroxysmal anxiety] without agoraphobia: Secondary | ICD-10-CM

## 2019-12-08 DIAGNOSIS — I10 Essential (primary) hypertension: Secondary | ICD-10-CM
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
## 2019-12-11 ENCOUNTER — Other Ambulatory Visit: Payer: Self-pay | Admitting: Family Medicine

## 2019-12-17 ENCOUNTER — Other Ambulatory Visit (INDEPENDENT_AMBULATORY_CARE_PROVIDER_SITE_OTHER): Payer: Self-pay

## 2019-12-22 ENCOUNTER — Encounter (INDEPENDENT_AMBULATORY_CARE_PROVIDER_SITE_OTHER): Payer: Self-pay | Admitting: Otolaryngology

## 2019-12-22 ENCOUNTER — Other Ambulatory Visit: Payer: Self-pay

## 2019-12-22 ENCOUNTER — Ambulatory Visit (INDEPENDENT_AMBULATORY_CARE_PROVIDER_SITE_OTHER): Payer: Medicare PPO | Admitting: Otolaryngology

## 2019-12-22 VITALS — Temp 97.3°F

## 2019-12-22 DIAGNOSIS — H9042 Sensorineural hearing loss, unilateral, left ear, with unrestricted hearing on the contralateral side: Secondary | ICD-10-CM | POA: Diagnosis not present
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
## 2019-12-25 ENCOUNTER — Telehealth: Payer: Self-pay

## 2019-12-25 NOTE — Telephone Encounter (Signed)
DR G - phone number and name are in note.

## 2019-12-25 NOTE — Telephone Encounter (Signed)
Notes have been faxed to number requested.

## 2019-12-25 NOTE — Telephone Encounter (Signed)
Spoke with Julie [name], she just needs the notes faxed. No peer to peer needed at this time.  I have printed and given to [employer]

## 2019-12-25 NOTE — Telephone Encounter (Signed)
Yes, glad to do a peer to peer if you can give me the information to do so.

## 2019-12-28 ENCOUNTER — Telehealth: Payer: Self-pay | Admitting: Family Medicine

## 2019-12-28 ENCOUNTER — Encounter: Payer: Self-pay | Admitting: Family Medicine

## 2019-12-28 ENCOUNTER — Other Ambulatory Visit (INDEPENDENT_AMBULATORY_CARE_PROVIDER_SITE_OTHER): Payer: Self-pay

## 2019-12-28 ENCOUNTER — Telehealth (INDEPENDENT_AMBULATORY_CARE_PROVIDER_SITE_OTHER): Payer: Self-pay

## 2019-12-28 ENCOUNTER — Telehealth: Payer: Self-pay

## 2019-12-28 DIAGNOSIS — H9042 Sensorineural hearing loss, unilateral, left ear, with unrestricted hearing on the contralateral side: Secondary | ICD-10-CM

## 2019-12-28 MED ORDER — ALPRAZOLAM 0.25 MG PO TABS: 0.2500 mg | ORAL_TABLET | ORAL | 0 refills | Status: AC

## 2019-12-28 NOTE — Telephone Encounter (Signed)
I called her to review her gene site testing results.  I also saw a telephone note that indicates she was having increased anxiety symptoms.  Of note, she recently had her Synthroid increased by one of my colleagues to 125 mcg daily.  I would like to see her in office and see that she has an appointment scheduled on Wednesday.  We will plan to repeat her thyroid labs at that point.  We will also plan to review her gene site testing in person and make a plan for medication initiation.  I did reach her but unfortunately we are disconnected and after 2 additional attempts to reach her again there was no answer.  I suspect that the weather may be impacting the reception.  Should she return my call, please inform her of the above.

## 2019-12-28 NOTE — Progress Notes (Signed)
Xan

## 2019-12-28 NOTE — Progress Notes (Signed)
Mri

## 2019-12-30 ENCOUNTER — Other Ambulatory Visit: Payer: Self-pay

## 2019-12-30 ENCOUNTER — Ambulatory Visit: Payer: Medicare PPO | Admitting: Family Medicine

## 2019-12-30 ENCOUNTER — Encounter: Payer: Self-pay | Admitting: Family Medicine

## 2019-12-30 VITALS — BP 132/74 | HR 80 | Temp 97.8°F | Ht 63.0 in | Wt 157.6 lb

## 2019-12-30 DIAGNOSIS — Z130 Encounter for screening for diseases of the blood and blood-forming organs and certain disorders involving the immune mechanism: Secondary | ICD-10-CM | POA: Diagnosis not present

## 2019-12-30 DIAGNOSIS — F410 Panic disorder [episodic paroxysmal anxiety] without agoraphobia: Secondary | ICD-10-CM | POA: Diagnosis not present

## 2019-12-30 DIAGNOSIS — F419 Anxiety disorder, unspecified: Secondary | ICD-10-CM

## 2019-12-30 DIAGNOSIS — E890 Postprocedural hypothyroidism: Secondary | ICD-10-CM

## 2019-12-30 MED ORDER — FLUOXETINE HCL 20 MG PO CAPS: 20.0000 mg | ORAL_CAPSULE | Freq: Every day | ORAL | 1 refills | Status: DC

## 2019-12-30 MED ORDER — CLONAZEPAM 0.5 MG PO TABS: 0.2500 mg | ORAL_TABLET | Freq: Two times a day (BID) | ORAL | 1 refills | Status: DC | PRN
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
## 2019-12-31 LAB — LIPID PANEL
Chol/HDL Ratio: 2.7 ratio (ref 0.0–4.4)
Cholesterol, Total: 180 mg/dL (ref 100–199)
HDL: 67 mg/dL (ref >39)
LDL Chol Calc (NIH): 100 mg/dL — ABNORMAL HIGH (ref 0–99)
Triglycerides: 70 mg/dL (ref 0–149)
VLDL Cholesterol Cal: 13 mg/dL (ref 5–40)

## 2019-12-31 LAB — CMP14+EGFR
ALT: 11 IU/L (ref 0–32)
AST: 14 IU/L (ref 0–40)
Albumin/Globulin Ratio: 1.8 (ref 1.2–2.2)
Albumin: 4.6 g/dL (ref 3.8–4.8)
Alkaline Phosphatase: 74 IU/L (ref 44–121)
BUN/Creatinine Ratio: 19 (ref 12–28)
BUN: 18 mg/dL (ref 8–27)
Bilirubin Total: 0.3 mg/dL (ref 0.0–1.2)
CO2: 23 mmol/L (ref 20–29)
Calcium: 9.4 mg/dL (ref 8.7–10.3)
Chloride: 98 mmol/L (ref 96–106)
Creatinine, Ser: 0.93 mg/dL (ref 0.57–1.00)
GFR calc Af Amer: 74 mL/min/{1.73_m2} (ref >59)
GFR calc non Af Amer: 64 mL/min/{1.73_m2} (ref >59)
Globulin, Total: 2.5 g/dL (ref 1.5–4.5)
Glucose: 84 mg/dL (ref 65–99)
Potassium: 4.2 mmol/L (ref 3.5–5.2)
Sodium: 136 mmol/L (ref 134–144)
Total Protein: 7.1 g/dL (ref 6.0–8.5)

## 2019-12-31 LAB — CBC
Hematocrit: 40.4 % (ref 34.0–46.6)
Hemoglobin: 13.9 g/dL (ref 11.1–15.9)
MCH: 30 pg (ref 26.6–33.0)
MCHC: 34.4 g/dL (ref 31.5–35.7)
MCV: 87 fL (ref 79–97)
Platelets: 254 10*3/uL (ref 150–450)
RBC: 4.64 x10E6/uL (ref 3.77–5.28)
RDW: 13.1 % (ref 11.7–15.4)
WBC: 5.4 10*3/uL (ref 3.4–10.8)

## 2019-12-31 LAB — THYROID PANEL WITH TSH
Free Thyroxine Index: 2.2 (ref 1.2–4.9)
T3 Uptake Ratio: 27 % (ref 24–39)
T4, Total: 8.1 ug/dL (ref 4.5–12.0)
TSH: 1.22 u[IU]/mL (ref 0.450–4.500)

## 2020-01-05 DIAGNOSIS — D333 Benign neoplasm of cranial nerves: Secondary | ICD-10-CM | POA: Diagnosis not present

## 2020-01-08 ENCOUNTER — Telehealth (INDEPENDENT_AMBULATORY_CARE_PROVIDER_SITE_OTHER): Payer: Self-pay

## 2020-01-08 NOTE — Telephone Encounter (Signed)
Called Julie [name] concerning results of her recent temporal bone MRI scan. 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 I recommended patient seeing Dr. Freddy Jaksch concerning any further treatment at this tumor. We will fax referral information to Dr. Freddy Jaksch. Patient has a copy of the disc. She will call us if she has any further questions.

## 2020-01-22 ENCOUNTER — Other Ambulatory Visit: Payer: Self-pay | Admitting: Family Medicine

## 2020-01-22 DIAGNOSIS — F419 Anxiety disorder, unspecified: Secondary | ICD-10-CM

## 2020-01-22 DIAGNOSIS — F410 Panic disorder [episodic paroxysmal anxiety] without agoraphobia: Secondary | ICD-10-CM

## 2020-01-26 ENCOUNTER — Other Ambulatory Visit: Payer: Self-pay | Admitting: Family Medicine

## 2020-02-01 ENCOUNTER — Ambulatory Visit (INDEPENDENT_AMBULATORY_CARE_PROVIDER_SITE_OTHER): Payer: Medicare PPO | Admitting: Family Medicine

## 2020-02-01 VITALS — BP 120/60

## 2020-02-01 DIAGNOSIS — F410 Panic disorder [episodic paroxysmal anxiety] without agoraphobia: Secondary | ICD-10-CM

## 2020-02-01 DIAGNOSIS — D333 Benign neoplasm of cranial nerves: Secondary | ICD-10-CM | POA: Diagnosis not present

## 2020-02-01 DIAGNOSIS — F418 Other specified anxiety disorders: Secondary | ICD-10-CM | POA: Diagnosis not present

## 2020-02-01 MED ORDER — CITALOPRAM HYDROBROMIDE 10 MG PO TABS: 5.0000 mg | ORAL_TABLET | Freq: Every day | ORAL | 1 refills | Status: DC

## 2020-02-01 MED ORDER — LEVOTHYROXINE SODIUM 125 MCG PO TABS
125.0000 ug | ORAL_TABLET | Freq: Every day | ORAL | 3 refills | Status: DC
Start: 2020-02-01 — End: 2020-08-29
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
## 2020-02-04 DIAGNOSIS — D333 Benign neoplasm of cranial nerves: Secondary | ICD-10-CM | POA: Diagnosis not present

## 2020-02-16 ENCOUNTER — Encounter: Payer: Self-pay | Admitting: Family Medicine

## 2020-02-28 ENCOUNTER — Other Ambulatory Visit: Payer: Self-pay | Admitting: Family Medicine

## 2020-02-28 DIAGNOSIS — F410 Panic disorder [episodic paroxysmal anxiety] without agoraphobia: Secondary | ICD-10-CM

## 2020-02-28 DIAGNOSIS — F418 Other specified anxiety disorders: Secondary | ICD-10-CM

## 2020-03-04 ENCOUNTER — Other Ambulatory Visit: Payer: Self-pay | Admitting: Family Medicine

## 2020-03-14 ENCOUNTER — Ambulatory Visit (INDEPENDENT_AMBULATORY_CARE_PROVIDER_SITE_OTHER): Payer: Medicare PPO | Admitting: Family Medicine

## 2020-03-14 DIAGNOSIS — F418 Other specified anxiety disorders: Secondary | ICD-10-CM | POA: Diagnosis not present

## 2020-03-14 DIAGNOSIS — F410 Panic disorder [episodic paroxysmal anxiety] without agoraphobia: Secondary | ICD-10-CM | POA: Diagnosis not present

## 2020-03-14 MED ORDER — CITALOPRAM HYDROBROMIDE 10 MG PO TABS: 15.0000 mg | ORAL_TABLET | Freq: Every day | ORAL | 0 refills | Status: DC
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
## 2020-03-23 DIAGNOSIS — H04123 Dry eye syndrome of bilateral lacrimal glands: Secondary | ICD-10-CM | POA: Diagnosis not present

## 2020-03-23 DIAGNOSIS — L718 Other rosacea: Secondary | ICD-10-CM | POA: Diagnosis not present

## 2020-03-23 DIAGNOSIS — H0288B Meibomian gland dysfunction left eye, upper and lower eyelids: Secondary | ICD-10-CM | POA: Diagnosis not present

## 2020-03-23 DIAGNOSIS — H0288A Meibomian gland dysfunction right eye, upper and lower eyelids: Secondary | ICD-10-CM | POA: Diagnosis not present

## 2020-03-23 DIAGNOSIS — Z961 Presence of intraocular lens: Secondary | ICD-10-CM | POA: Diagnosis not present

## 2020-03-25 ENCOUNTER — Other Ambulatory Visit: Payer: Self-pay | Admitting: Family Medicine

## 2020-04-16 ENCOUNTER — Other Ambulatory Visit: Payer: Self-pay | Admitting: Family Medicine

## 2020-04-19 ENCOUNTER — Ambulatory Visit (INDEPENDENT_AMBULATORY_CARE_PROVIDER_SITE_OTHER): Payer: Medicare PPO | Admitting: Family Medicine

## 2020-04-19 DIAGNOSIS — F410 Panic disorder [episodic paroxysmal anxiety] without agoraphobia: Secondary | ICD-10-CM

## 2020-04-19 DIAGNOSIS — F418 Other specified anxiety disorders: Secondary | ICD-10-CM

## 2020-04-19 DIAGNOSIS — H04129 Dry eye syndrome of unspecified lacrimal gland: Secondary | ICD-10-CM

## 2020-04-19 MED ORDER — CITALOPRAM HYDROBROMIDE 20 MG PO TABS
20.0000 mg | ORAL_TABLET | Freq: Every day | ORAL | 3 refills | Status: DC
Start: 2020-04-19 — End: 2020-05-02
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
## 2020-04-30 ENCOUNTER — Encounter: Payer: Self-pay | Admitting: Family Medicine

## 2020-05-02 ENCOUNTER — Other Ambulatory Visit: Payer: Self-pay | Admitting: *Deleted

## 2020-05-02 DIAGNOSIS — F410 Panic disorder [episodic paroxysmal anxiety] without agoraphobia: Secondary | ICD-10-CM

## 2020-05-02 DIAGNOSIS — F418 Other specified anxiety disorders: Secondary | ICD-10-CM

## 2020-05-02 MED ORDER — CITALOPRAM HYDROBROMIDE 20 MG PO TABS: 20.0000 mg | ORAL_TABLET | Freq: Every day | ORAL | 1 refills | Status: DC

## 2020-06-01 DIAGNOSIS — J301 Allergic rhinitis due to pollen: Secondary | ICD-10-CM | POA: Diagnosis not present

## 2020-06-01 DIAGNOSIS — R21 Rash and other nonspecific skin eruption: Secondary | ICD-10-CM | POA: Diagnosis not present

## 2020-06-01 DIAGNOSIS — R059 Cough, unspecified: Secondary | ICD-10-CM | POA: Diagnosis not present

## 2020-06-02 ENCOUNTER — Other Ambulatory Visit: Payer: Self-pay | Admitting: Family Medicine

## 2020-06-25 ENCOUNTER — Other Ambulatory Visit: Payer: Self-pay | Admitting: Family Medicine

## 2020-06-28 ENCOUNTER — Encounter: Payer: Self-pay | Admitting: Family Medicine

## 2020-06-28 ENCOUNTER — Other Ambulatory Visit: Payer: Self-pay

## 2020-06-28 ENCOUNTER — Ambulatory Visit: Payer: Medicare PPO | Admitting: Family Medicine

## 2020-06-28 VITALS — BP 125/85 | HR 81 | Temp 98.2°F | Ht 63.0 in | Wt 161.6 lb

## 2020-06-28 DIAGNOSIS — B001 Herpesviral vesicular dermatitis: Secondary | ICD-10-CM | POA: Diagnosis not present

## 2020-06-28 DIAGNOSIS — R079 Chest pain, unspecified: Secondary | ICD-10-CM

## 2020-06-28 DIAGNOSIS — I10 Essential (primary) hypertension: Secondary | ICD-10-CM

## 2020-06-28 DIAGNOSIS — F410 Panic disorder [episodic paroxysmal anxiety] without agoraphobia: Secondary | ICD-10-CM

## 2020-06-28 DIAGNOSIS — F418 Other specified anxiety disorders: Secondary | ICD-10-CM

## 2020-06-28 DIAGNOSIS — E7800 Pure hypercholesterolemia, unspecified: Secondary | ICD-10-CM

## 2020-06-28 DIAGNOSIS — E890 Postprocedural hypothyroidism: Secondary | ICD-10-CM | POA: Diagnosis not present

## 2020-06-28 DIAGNOSIS — E559 Vitamin D deficiency, unspecified: Secondary | ICD-10-CM

## 2020-06-28 MED ORDER — CLONAZEPAM 0.5 MG PO TABS: 0.2500 mg | ORAL_TABLET | Freq: Two times a day (BID) | ORAL | 1 refills | Status: DC | PRN

## 2020-06-28 MED ORDER — VALACYCLOVIR HCL 1 G PO TABS: 2000.0000 mg | ORAL_TABLET | Freq: Two times a day (BID) | ORAL | 0 refills | Status: DC

## 2020-06-28 MED ORDER — NITROGLYCERIN 0.4 MG SL SUBL: 0.4000 mg | SUBLINGUAL_TABLET | SUBLINGUAL | 0 refills | Status: AC | PRN

## 2020-06-28 MED ORDER — CITALOPRAM HYDROBROMIDE 20 MG PO TABS: 20.0000 mg | ORAL_TABLET | Freq: Every day | ORAL | 3 refills | Status: DC

## 2020-06-28 MED ORDER — LOSARTAN POTASSIUM-HCTZ 50-12.5 MG PO TABS: 1.0000 | ORAL_TABLET | Freq: Every day | ORAL | 3 refills | Status: DC

## 2020-06-28 MED ORDER — ROSUVASTATIN CALCIUM 5 MG PO TABS: ORAL_TABLET | ORAL | 3 refills | Status: DC
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
## 2020-07-09 DIAGNOSIS — J301 Allergic rhinitis due to pollen: Secondary | ICD-10-CM | POA: Diagnosis not present

## 2020-07-11 ENCOUNTER — Other Ambulatory Visit: Payer: Self-pay | Admitting: *Deleted

## 2020-07-11 DIAGNOSIS — F419 Anxiety disorder, unspecified: Secondary | ICD-10-CM

## 2020-07-12 ENCOUNTER — Other Ambulatory Visit: Payer: Medicare PPO

## 2020-07-12 DIAGNOSIS — E559 Vitamin D deficiency, unspecified: Secondary | ICD-10-CM | POA: Diagnosis not present

## 2020-07-12 DIAGNOSIS — E7800 Pure hypercholesterolemia, unspecified: Secondary | ICD-10-CM | POA: Diagnosis not present

## 2020-07-12 DIAGNOSIS — E890 Postprocedural hypothyroidism: Secondary | ICD-10-CM

## 2020-07-13 ENCOUNTER — Other Ambulatory Visit: Payer: Self-pay

## 2020-07-13 DIAGNOSIS — E890 Postprocedural hypothyroidism: Secondary | ICD-10-CM

## 2020-07-13 DIAGNOSIS — H0288A Meibomian gland dysfunction right eye, upper and lower eyelids: Secondary | ICD-10-CM | POA: Diagnosis not present

## 2020-07-13 DIAGNOSIS — Z961 Presence of intraocular lens: Secondary | ICD-10-CM | POA: Diagnosis not present

## 2020-07-13 DIAGNOSIS — L718 Other rosacea: Secondary | ICD-10-CM | POA: Diagnosis not present

## 2020-07-13 DIAGNOSIS — H0288B Meibomian gland dysfunction left eye, upper and lower eyelids: Secondary | ICD-10-CM | POA: Diagnosis not present

## 2020-07-13 DIAGNOSIS — H04123 Dry eye syndrome of bilateral lacrimal glands: Secondary | ICD-10-CM | POA: Diagnosis not present

## 2020-07-13 LAB — CMP14+EGFR
ALT: 17 IU/L (ref 0–32)
AST: 17 IU/L (ref 0–40)
Albumin/Globulin Ratio: 1.8 (ref 1.2–2.2)
Albumin: 4.2 g/dL (ref 3.8–4.8)
Alkaline Phosphatase: 73 IU/L (ref 44–121)
BUN/Creatinine Ratio: 15 (ref 12–28)
BUN: 16 mg/dL (ref 8–27)
Bilirubin Total: 0.4 mg/dL (ref 0.0–1.2)
CO2: 23 mmol/L (ref 20–29)
Calcium: 9.6 mg/dL (ref 8.7–10.3)
Chloride: 103 mmol/L (ref 96–106)
Creatinine, Ser: 1.08 mg/dL — ABNORMAL HIGH (ref 0.57–1.00)
Globulin, Total: 2.4 g/dL (ref 1.5–4.5)
Glucose: 89 mg/dL (ref 65–99)
Potassium: 4.4 mmol/L (ref 3.5–5.2)
Sodium: 142 mmol/L (ref 134–144)
Total Protein: 6.6 g/dL (ref 6.0–8.5)
eGFR: 56 mL/min/{1.73_m2} — ABNORMAL LOW (ref >59)

## 2020-07-13 LAB — LIPID PANEL
Chol/HDL Ratio: 2.8 ratio (ref 0.0–4.4)
Cholesterol, Total: 171 mg/dL (ref 100–199)
HDL: 62 mg/dL (ref >39)
LDL Chol Calc (NIH): 94 mg/dL (ref 0–99)
Triglycerides: 83 mg/dL (ref 0–149)
VLDL Cholesterol Cal: 15 mg/dL (ref 5–40)

## 2020-07-13 LAB — T4, FREE: Free T4: 1.58 ng/dL (ref 0.82–1.77)

## 2020-07-13 LAB — VITAMIN D 25 HYDROXY (VIT D DEFICIENCY, FRACTURES): Vit D, 25-Hydroxy: 80.5 ng/mL (ref 30.0–100.0)

## 2020-07-13 LAB — TSH: TSH: 0.05 u[IU]/mL — ABNORMAL LOW (ref 0.450–4.500)

## 2020-07-28 ENCOUNTER — Ambulatory Visit (INDEPENDENT_AMBULATORY_CARE_PROVIDER_SITE_OTHER): Payer: Medicare PPO

## 2020-07-28 VITALS — Ht 63.0 in | Wt 157.0 lb

## 2020-07-28 DIAGNOSIS — Z0000 Encounter for general adult medical examination without abnormal findings: Secondary | ICD-10-CM | POA: Diagnosis not present

## 2020-07-28 DIAGNOSIS — R21 Rash and other nonspecific skin eruption: Secondary | ICD-10-CM | POA: Insufficient documentation

## 2020-07-28 DIAGNOSIS — R059 Cough, unspecified: Secondary | ICD-10-CM | POA: Insufficient documentation

## 2020-07-28 DIAGNOSIS — Z1231 Encounter for screening mammogram for malignant neoplasm of breast: Secondary | ICD-10-CM

## 2020-07-28 DIAGNOSIS — J301 Allergic rhinitis due to pollen: Secondary | ICD-10-CM | POA: Insufficient documentation

## 2020-07-28 NOTE — Patient Instructions (Signed)
Julie [name] , Thank you for taking time to come for your Medicare Wellness Visit. I appreciate your ongoing commitment to your health goals. Please review the following plan we discussed and let me know if I can assist you in the future.   Screening recommendations/referrals: Colonoscopy: Done 01/25/2016 - Repeat in 10 years Mammogram: Done 09/01/2019 - Repeat annually Bone Density: Done 08/13/2016 - Repeat in 5 years Recommended yearly ophthalmology/optometry visit for glaucoma screening and checkup Recommended yearly dental visit for hygiene and checkup  Vaccinations: Influenza vaccine: Done 11/25/2019 - Repeat annually Pneumococcal vaccine: Prevnar done 01/19/2014; due for Pneumovax (had bad localized reaction to Prevnar - concerned she may have same reaction) Tdap vaccine: Done 2010 - DUE (every 10 years) Shingles vaccine: Zostavax done 01/19/2015; DUE for Shingrix discussed. Please contact your pharmacy for coverage information.    Covid-19: Done 04/10/19, 05/08/19, & 01/21/20  Advanced directives: Advance directive discussed with you today. I have provided a copy for you to complete at home and have notarized. Once this is complete please bring a copy in to our office so we can scan it into your chart.  Conditions/risks identified: Aim for 30 minutes of exercise or brisk walking each day, drink 6-8 glasses of water and eat lots of fruits and vegetables.  Next appointment: Follow up in one year for your annual wellness visit    Preventive Care 65 Years and Older, Female Preventive care refers to lifestyle choices and visits with your health care provider that can promote health and wellness. What does preventive care include?  A yearly physical exam. This is also called an annual well check.  Dental exams once or twice a year.  Routine eye exams. Ask your health care provider how often you should have your eyes checked.  Personal lifestyle choices, including:  Daily care of your teeth  and gums.  Regular physical activity.  Eating a healthy diet.  Avoiding tobacco and drug use.  Limiting alcohol use.  Practicing safe sex.  Taking low-dose aspirin every day.  Taking vitamin and mineral supplements as recommended by your health care provider. What happens during an annual well check? The services and screenings done by your health care provider during your annual well check will depend on your age, overall health, lifestyle risk factors, and family history of disease. Counseling  Your health care provider may ask you questions about your:  Alcohol use.  Tobacco use.  Drug use.  Emotional well-being.  Home and relationship well-being.  Sexual activity.  Eating habits.  History of falls.  Memory and ability to understand (cognition).  Work and work Statistician.  Reproductive health. Screening  You may have the following tests or measurements:  Height, weight, and BMI.  Blood pressure.  Lipid and cholesterol levels. These may be checked every 5 years, or more frequently if you are over 23 years old.  Skin check.  Lung cancer screening. You may have this screening every year starting at age 53 if you have a 30-pack-year history of smoking and currently smoke or have quit within the past 15 years.  Fecal occult blood test (FOBT) of the stool. You may have this test every year starting at age 60.  Flexible sigmoidoscopy or colonoscopy. You may have a sigmoidoscopy every 5 years or a colonoscopy every 10 years starting at age 63.  Hepatitis C blood test.  Hepatitis B blood test.  Sexually transmitted disease (STD) testing.  Diabetes screening. This is done by checking your blood sugar (glucose)  after you have not eaten for a while (fasting). You may have this done every 1-3 years.  Bone density scan. This is done to screen for osteoporosis. You may have this done starting at age 26.  Mammogram. This may be done every 1-2 years. Talk to  your health care provider about how often you should have regular mammograms. Talk with your health care provider about your test results, treatment options, and if necessary, the need for more tests. Vaccines  Your health care provider may recommend certain vaccines, such as:  Influenza vaccine. This is recommended every year.  Tetanus, diphtheria, and acellular pertussis (Tdap, Td) vaccine. You may need a Td booster every 10 years.  Zoster vaccine. You may need this after age 45.  Pneumococcal 13-valent conjugate (PCV13) vaccine. One dose is recommended after age 80.  Pneumococcal polysaccharide (PPSV23) vaccine. One dose is recommended after age 45. Talk to your health care provider about which screenings and vaccines you need and how often you need them. This information is not intended to replace advice given to you by your health care provider. Make sure you discuss any questions you have with your health care provider. Document Released: 03/18/2015 Document Revised: 11/09/2015 Document Reviewed: 12/21/2014 Elsevier Interactive Patient Education  2017 [employer] Prevention in the Home Falls can cause injuries. They can happen to people of all ages. There are many things you can do to make your home safe and to help prevent falls. What can I do on the outside of my home?  Regularly fix the edges of walkways and driveways and fix any cracks.  Remove anything that might make you trip as you walk through a door, such as a raised step or threshold.  Trim any bushes or trees on the path to your home.  Use bright outdoor lighting.  Clear any walking paths of anything that might make someone trip, such as rocks or tools.  Regularly check to see if handrails are loose or broken. Make sure that both sides of any steps have handrails.  Any raised decks and porches should have guardrails on the edges.  Have any leaves, snow, or ice cleared regularly.  Use sand or salt on  walking paths during winter.  Clean up any spills in your garage right away. This includes oil or grease spills. What can I do in the bathroom?  Use night lights.  Install grab bars by the toilet and in the tub and shower. Do not use towel bars as grab bars.  Use non-skid mats or decals in the tub or shower.  If you need to sit down in the shower, use a plastic, non-slip stool.  Keep the floor dry. Clean up any water that spills on the floor as soon as it happens.  Remove soap buildup in the tub or shower regularly.  Attach bath mats securely with double-sided non-slip rug tape.  Do not have throw rugs and other things on the floor that can make you trip. What can I do in the bedroom?  Use night lights.  Make sure that you have a light by your bed that is easy to reach.  Do not use any sheets or blankets that are too big for your bed. They should not hang down onto the floor.  Have a firm chair that has side arms. You can use this for support while you get dressed.  Do not have throw rugs and other things on the floor that can make you  trip. What can I do in the kitchen?  Clean up any spills right away.  Avoid walking on wet floors.  Keep items that you use a lot in easy-to-reach places.  If you need to reach something above you, use a strong step stool that has a grab bar.  Keep electrical cords out of the way.  Do not use floor polish or wax that makes floors slippery. If you must use wax, use non-skid floor wax.  Do not have throw rugs and other things on the floor that can make you trip. What can I do with my stairs?  Do not leave any items on the stairs.  Make sure that there are handrails on both sides of the stairs and use them. Fix handrails that are broken or loose. Make sure that handrails are as long as the stairways.  Check any carpeting to make sure that it is firmly attached to the stairs. Fix any carpet that is loose or worn.  Avoid having throw  rugs at the top or bottom of the stairs. If you do have throw rugs, attach them to the floor with carpet tape.  Make sure that you have a light switch at the top of the stairs and the bottom of the stairs. If you do not have them, ask someone to add them for you. What else can I do to help prevent falls?  Wear shoes that:  Do not have high heels.  Have rubber bottoms.  Are comfortable and fit you well.  Are closed at the toe. Do not wear sandals.  If you use a stepladder:  Make sure that it is fully opened. Do not climb a closed stepladder.  Make sure that both sides of the stepladder are locked into place.  Ask someone to hold it for you, if possible.  Clearly mark and make sure that you can see:  Any grab bars or handrails.  First and last steps.  Where the edge of each step is.  Use tools that help you move around (mobility aids) if they are needed. These include:  Canes.  Walkers.  Scooters.  Crutches.  Turn on the lights when you go into a dark area. Replace any light bulbs as soon as they burn out.  Set up your furniture so you have a clear path. Avoid moving your furniture around.  If any of your floors are uneven, fix them.  If there are any pets around you, be aware of where they are.  Review your medicines with your doctor. Some medicines can make you feel dizzy. This can increase your chance of falling. Ask your doctor what other things that you can do to help prevent falls. This information is not intended to replace advice given to you by your health care provider. Make sure you discuss any questions you have with your health care provider. Document Released: 12/16/2008 Document Revised: 07/28/2015 Document Reviewed: 03/26/2014 Elsevier Interactive Patient Education  2017 [employer].
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
## 2020-07-29 ENCOUNTER — Other Ambulatory Visit: Payer: Self-pay

## 2020-07-29 DIAGNOSIS — F419 Anxiety disorder, unspecified: Secondary | ICD-10-CM

## 2020-07-29 MED ORDER — VITAMIN D (ERGOCALCIFEROL) 1.25 MG (50000 UNIT) PO CAPS: ORAL_CAPSULE | ORAL | 1 refills | Status: DC

## 2020-08-26 ENCOUNTER — Other Ambulatory Visit: Payer: Medicare PPO

## 2020-08-26 ENCOUNTER — Other Ambulatory Visit: Payer: Self-pay

## 2020-08-26 DIAGNOSIS — E890 Postprocedural hypothyroidism: Secondary | ICD-10-CM

## 2020-08-27 LAB — TSH: TSH: 0.07 u[IU]/mL — ABNORMAL LOW (ref 0.450–4.500)

## 2020-08-27 LAB — T4, FREE: Free T4: 1.39 ng/dL (ref 0.82–1.77)

## 2020-08-29 ENCOUNTER — Other Ambulatory Visit: Payer: Self-pay | Admitting: Family Medicine

## 2020-08-29 DIAGNOSIS — E890 Postprocedural hypothyroidism: Secondary | ICD-10-CM

## 2020-08-29 MED ORDER — LEVOTHYROXINE SODIUM 112 MCG PO TABS: 112.0000 ug | ORAL_TABLET | Freq: Every day | ORAL | 1 refills | Status: DC

## 2020-09-12 ENCOUNTER — Ambulatory Visit
Admission: RE | Admit: 2020-09-12 | Discharge: 2020-09-12 | Disposition: A | Discharge status: Home / Self Care | Payer: Medicare PPO | Source: Ambulatory Visit | Attending: Family Medicine | Admitting: Family Medicine

## 2020-09-12 ENCOUNTER — Other Ambulatory Visit: Payer: Self-pay

## 2020-09-12 DIAGNOSIS — Z1231 Encounter for screening mammogram for malignant neoplasm of breast: Secondary | ICD-10-CM

## 2020-09-26 DIAGNOSIS — J301 Allergic rhinitis due to pollen: Secondary | ICD-10-CM | POA: Diagnosis not present

## 2020-09-26 DIAGNOSIS — L501 Idiopathic urticaria: Secondary | ICD-10-CM | POA: Diagnosis not present

## 2020-09-26 DIAGNOSIS — R21 Rash and other nonspecific skin eruption: Secondary | ICD-10-CM | POA: Diagnosis not present

## 2020-09-26 DIAGNOSIS — R059 Cough, unspecified: Secondary | ICD-10-CM | POA: Diagnosis not present

## 2020-10-17 ENCOUNTER — Other Ambulatory Visit: Payer: Self-pay

## 2020-10-17 ENCOUNTER — Other Ambulatory Visit: Payer: Medicare PPO

## 2020-10-17 DIAGNOSIS — E890 Postprocedural hypothyroidism: Secondary | ICD-10-CM | POA: Diagnosis not present

## 2020-10-18 LAB — TSH: TSH: 0.203 u[IU]/mL — ABNORMAL LOW (ref 0.450–4.500)

## 2020-10-18 LAB — T4, FREE: Free T4: 1.22 ng/dL (ref 0.82–1.77)

## 2020-10-21 ENCOUNTER — Other Ambulatory Visit: Payer: Self-pay | Admitting: Family Medicine

## 2020-10-21 DIAGNOSIS — E7800 Pure hypercholesterolemia, unspecified: Secondary | ICD-10-CM

## 2020-10-27 DIAGNOSIS — J301 Allergic rhinitis due to pollen: Secondary | ICD-10-CM | POA: Diagnosis not present

## 2020-10-31 DIAGNOSIS — J301 Allergic rhinitis due to pollen: Secondary | ICD-10-CM | POA: Diagnosis not present

## 2020-11-03 DIAGNOSIS — J301 Allergic rhinitis due to pollen: Secondary | ICD-10-CM | POA: Diagnosis not present

## 2020-11-08 DIAGNOSIS — J301 Allergic rhinitis due to pollen: Secondary | ICD-10-CM | POA: Diagnosis not present

## 2020-11-10 DIAGNOSIS — J301 Allergic rhinitis due to pollen: Secondary | ICD-10-CM | POA: Diagnosis not present

## 2020-11-15 DIAGNOSIS — J301 Allergic rhinitis due to pollen: Secondary | ICD-10-CM | POA: Diagnosis not present

## 2020-11-17 DIAGNOSIS — J301 Allergic rhinitis due to pollen: Secondary | ICD-10-CM | POA: Diagnosis not present

## 2020-11-21 DIAGNOSIS — J301 Allergic rhinitis due to pollen: Secondary | ICD-10-CM | POA: Diagnosis not present

## 2020-11-24 DIAGNOSIS — J301 Allergic rhinitis due to pollen: Secondary | ICD-10-CM | POA: Diagnosis not present

## 2020-11-28 DIAGNOSIS — J301 Allergic rhinitis due to pollen: Secondary | ICD-10-CM | POA: Diagnosis not present

## 2020-12-01 DIAGNOSIS — J301 Allergic rhinitis due to pollen: Secondary | ICD-10-CM | POA: Diagnosis not present

## 2020-12-05 DIAGNOSIS — J301 Allergic rhinitis due to pollen: Secondary | ICD-10-CM | POA: Diagnosis not present

## 2020-12-08 DIAGNOSIS — J301 Allergic rhinitis due to pollen: Secondary | ICD-10-CM | POA: Diagnosis not present

## 2020-12-13 DIAGNOSIS — J301 Allergic rhinitis due to pollen: Secondary | ICD-10-CM | POA: Diagnosis not present

## 2020-12-22 DIAGNOSIS — J301 Allergic rhinitis due to pollen: Secondary | ICD-10-CM | POA: Diagnosis not present

## 2020-12-26 DIAGNOSIS — J301 Allergic rhinitis due to pollen: Secondary | ICD-10-CM | POA: Diagnosis not present

## 2020-12-27 ENCOUNTER — Other Ambulatory Visit: Payer: Self-pay

## 2020-12-27 ENCOUNTER — Encounter: Payer: Self-pay | Admitting: Family Medicine

## 2020-12-27 ENCOUNTER — Ambulatory Visit: Payer: Medicare PPO | Admitting: Family Medicine

## 2020-12-27 VITALS — BP 125/84 | HR 79 | Temp 97.7°F | Ht 63.0 in | Wt 150.4 lb

## 2020-12-27 DIAGNOSIS — E7800 Pure hypercholesterolemia, unspecified: Secondary | ICD-10-CM

## 2020-12-27 DIAGNOSIS — Z79899 Other long term (current) drug therapy: Secondary | ICD-10-CM | POA: Diagnosis not present

## 2020-12-27 DIAGNOSIS — F410 Panic disorder [episodic paroxysmal anxiety] without agoraphobia: Secondary | ICD-10-CM

## 2020-12-27 DIAGNOSIS — E890 Postprocedural hypothyroidism: Secondary | ICD-10-CM

## 2020-12-27 DIAGNOSIS — R233 Spontaneous ecchymoses: Secondary | ICD-10-CM

## 2020-12-27 MED ORDER — CLONAZEPAM 0.5 MG PO TABS: 0.2500 mg | ORAL_TABLET | Freq: Two times a day (BID) | ORAL | 1 refills | Status: DC | PRN

## 2020-12-27 MED ORDER — LEVOTHYROXINE SODIUM 112 MCG PO TABS: ORAL_TABLET | ORAL | 1 refills | Status: DC

## 2020-12-27 MED ORDER — ROSUVASTATIN CALCIUM 5 MG PO TABS: ORAL_TABLET | ORAL | 3 refills | Status: DC

## 2020-12-27 NOTE — Addendum Note (Signed)
Addended by: Janora Norlander on: 12/27/2020 08:58 AM   Modules accepted: Orders
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
## 2020-12-27 NOTE — Patient Instructions (Signed)

## 2020-12-30 DIAGNOSIS — J301 Allergic rhinitis due to pollen: Secondary | ICD-10-CM | POA: Diagnosis not present

## 2021-01-01 LAB — T4, FREE: Free T4: 1.37 ng/dL (ref 0.82–1.77)

## 2021-01-01 LAB — DRUG SCREEN 10 W/CONF, SERUM
Amphetamines, IA: NEGATIVE ng/mL
Barbiturates, IA: NEGATIVE ug/mL
Benzodiazepines, IA: NEGATIVE ng/mL
Cocaine & Metabolite, IA: NEGATIVE ng/mL
Methadone, IA: NEGATIVE ng/mL
Opiates, IA: NEGATIVE ng/mL
Oxycodones, IA: NEGATIVE ng/mL
Phencyclidine, IA: NEGATIVE ng/mL
Propoxyphene, IA: NEGATIVE ng/mL
THC(Marijuana) Metabolite, IA: NEGATIVE ng/mL

## 2021-01-01 LAB — CBC
Hematocrit: 39.5 % (ref 34.0–46.6)
Hemoglobin: 13.5 g/dL (ref 11.1–15.9)
MCH: 29.1 pg (ref 26.6–33.0)
MCHC: 34.2 g/dL (ref 31.5–35.7)
MCV: 85 fL (ref 79–97)
Platelets: 313 10*3/uL (ref 150–450)
RBC: 4.64 x10E6/uL (ref 3.77–5.28)
RDW: 13.1 % (ref 11.7–15.4)
WBC: 4.3 10*3/uL (ref 3.4–10.8)

## 2021-01-01 LAB — TSH: TSH: 0.684 u[IU]/mL (ref 0.450–4.500)

## 2021-01-03 DIAGNOSIS — J301 Allergic rhinitis due to pollen: Secondary | ICD-10-CM | POA: Diagnosis not present

## 2021-01-04 DIAGNOSIS — L718 Other rosacea: Secondary | ICD-10-CM | POA: Diagnosis not present

## 2021-01-04 DIAGNOSIS — H0288B Meibomian gland dysfunction left eye, upper and lower eyelids: Secondary | ICD-10-CM | POA: Diagnosis not present

## 2021-01-04 DIAGNOSIS — H04123 Dry eye syndrome of bilateral lacrimal glands: Secondary | ICD-10-CM | POA: Diagnosis not present

## 2021-01-04 DIAGNOSIS — H0288A Meibomian gland dysfunction right eye, upper and lower eyelids: Secondary | ICD-10-CM | POA: Diagnosis not present

## 2021-01-04 DIAGNOSIS — Z961 Presence of intraocular lens: Secondary | ICD-10-CM | POA: Diagnosis not present

## 2021-01-06 DIAGNOSIS — J301 Allergic rhinitis due to pollen: Secondary | ICD-10-CM | POA: Diagnosis not present

## 2021-01-10 ENCOUNTER — Other Ambulatory Visit: Payer: Self-pay | Admitting: Family Medicine

## 2021-01-10 DIAGNOSIS — J301 Allergic rhinitis due to pollen: Secondary | ICD-10-CM | POA: Diagnosis not present

## 2021-01-10 DIAGNOSIS — E7800 Pure hypercholesterolemia, unspecified: Secondary | ICD-10-CM

## 2021-01-12 DIAGNOSIS — J301 Allergic rhinitis due to pollen: Secondary | ICD-10-CM | POA: Diagnosis not present

## 2021-01-14 ENCOUNTER — Other Ambulatory Visit: Payer: Self-pay | Admitting: Family Medicine

## 2021-01-14 DIAGNOSIS — F419 Anxiety disorder, unspecified: Secondary | ICD-10-CM

## 2021-01-16 DIAGNOSIS — J301 Allergic rhinitis due to pollen: Secondary | ICD-10-CM | POA: Diagnosis not present

## 2021-01-19 DIAGNOSIS — J301 Allergic rhinitis due to pollen: Secondary | ICD-10-CM | POA: Diagnosis not present

## 2021-01-23 DIAGNOSIS — J3089 Other allergic rhinitis: Secondary | ICD-10-CM | POA: Diagnosis not present

## 2021-01-23 DIAGNOSIS — J3081 Allergic rhinitis due to animal (cat) (dog) hair and dander: Secondary | ICD-10-CM | POA: Diagnosis not present

## 2021-01-23 DIAGNOSIS — J301 Allergic rhinitis due to pollen: Secondary | ICD-10-CM | POA: Diagnosis not present

## 2021-01-31 DIAGNOSIS — J301 Allergic rhinitis due to pollen: Secondary | ICD-10-CM | POA: Diagnosis not present

## 2021-02-06 DIAGNOSIS — J301 Allergic rhinitis due to pollen: Secondary | ICD-10-CM | POA: Diagnosis not present

## 2021-02-16 DIAGNOSIS — J301 Allergic rhinitis due to pollen: Secondary | ICD-10-CM | POA: Diagnosis not present

## 2021-02-22 DIAGNOSIS — J301 Allergic rhinitis due to pollen: Secondary | ICD-10-CM | POA: Diagnosis not present

## 2021-03-02 DIAGNOSIS — J301 Allergic rhinitis due to pollen: Secondary | ICD-10-CM | POA: Diagnosis not present

## 2021-03-07 ENCOUNTER — Other Ambulatory Visit: Payer: Self-pay | Admitting: Family Medicine

## 2021-03-07 DIAGNOSIS — B001 Herpesviral vesicular dermatitis: Secondary | ICD-10-CM

## 2021-03-10 DIAGNOSIS — J301 Allergic rhinitis due to pollen: Secondary | ICD-10-CM | POA: Diagnosis not present

## 2021-03-16 DIAGNOSIS — J301 Allergic rhinitis due to pollen: Secondary | ICD-10-CM | POA: Diagnosis not present

## 2021-03-20 DIAGNOSIS — J301 Allergic rhinitis due to pollen: Secondary | ICD-10-CM | POA: Diagnosis not present

## 2021-03-21 DIAGNOSIS — M1712 Unilateral primary osteoarthritis, left knee: Secondary | ICD-10-CM | POA: Diagnosis not present

## 2021-03-24 DIAGNOSIS — J301 Allergic rhinitis due to pollen: Secondary | ICD-10-CM | POA: Diagnosis not present

## 2021-03-30 DIAGNOSIS — J301 Allergic rhinitis due to pollen: Secondary | ICD-10-CM | POA: Diagnosis not present

## 2021-04-04 DIAGNOSIS — J301 Allergic rhinitis due to pollen: Secondary | ICD-10-CM | POA: Diagnosis not present

## 2021-04-04 DIAGNOSIS — D333 Benign neoplasm of cranial nerves: Secondary | ICD-10-CM | POA: Diagnosis not present

## 2021-04-06 ENCOUNTER — Other Ambulatory Visit: Payer: Self-pay | Admitting: Otolaryngology

## 2021-04-06 DIAGNOSIS — D333 Benign neoplasm of cranial nerves: Secondary | ICD-10-CM

## 2021-04-07 DIAGNOSIS — H9042 Sensorineural hearing loss, unilateral, left ear, with unrestricted hearing on the contralateral side: Secondary | ICD-10-CM | POA: Diagnosis not present

## 2021-04-11 ENCOUNTER — Other Ambulatory Visit: Payer: Self-pay | Admitting: Physician Assistant

## 2021-04-11 DIAGNOSIS — J301 Allergic rhinitis due to pollen: Secondary | ICD-10-CM | POA: Diagnosis not present

## 2021-04-14 DIAGNOSIS — D333 Benign neoplasm of cranial nerves: Secondary | ICD-10-CM | POA: Diagnosis not present

## 2021-04-14 DIAGNOSIS — R519 Headache, unspecified: Secondary | ICD-10-CM | POA: Diagnosis not present

## 2021-04-19 DIAGNOSIS — J301 Allergic rhinitis due to pollen: Secondary | ICD-10-CM | POA: Diagnosis not present

## 2021-04-21 DIAGNOSIS — M1712 Unilateral primary osteoarthritis, left knee: Secondary | ICD-10-CM | POA: Diagnosis not present

## 2021-04-24 DIAGNOSIS — Z882 Allergy status to sulfonamides status: Secondary | ICD-10-CM | POA: Diagnosis not present

## 2021-04-24 DIAGNOSIS — H9042 Sensorineural hearing loss, unilateral, left ear, with unrestricted hearing on the contralateral side: Secondary | ICD-10-CM | POA: Diagnosis not present

## 2021-04-24 DIAGNOSIS — Z91041 Radiographic dye allergy status: Secondary | ICD-10-CM | POA: Diagnosis not present

## 2021-04-24 DIAGNOSIS — R2689 Other abnormalities of gait and mobility: Secondary | ICD-10-CM | POA: Diagnosis not present

## 2021-04-24 DIAGNOSIS — D333 Benign neoplasm of cranial nerves: Secondary | ICD-10-CM | POA: Diagnosis not present

## 2021-04-24 DIAGNOSIS — H933X2 Disorders of left acoustic nerve: Secondary | ICD-10-CM | POA: Diagnosis not present

## 2021-04-26 DIAGNOSIS — J301 Allergic rhinitis due to pollen: Secondary | ICD-10-CM | POA: Diagnosis not present

## 2021-05-03 DIAGNOSIS — J301 Allergic rhinitis due to pollen: Secondary | ICD-10-CM | POA: Diagnosis not present

## 2021-05-11 DIAGNOSIS — J301 Allergic rhinitis due to pollen: Secondary | ICD-10-CM | POA: Diagnosis not present

## 2021-05-11 DIAGNOSIS — M1712 Unilateral primary osteoarthritis, left knee: Secondary | ICD-10-CM | POA: Diagnosis not present

## 2021-05-18 DIAGNOSIS — M1712 Unilateral primary osteoarthritis, left knee: Secondary | ICD-10-CM | POA: Diagnosis not present

## 2021-05-18 DIAGNOSIS — J301 Allergic rhinitis due to pollen: Secondary | ICD-10-CM | POA: Diagnosis not present

## 2021-05-26 DIAGNOSIS — M1712 Unilateral primary osteoarthritis, left knee: Secondary | ICD-10-CM | POA: Diagnosis not present

## 2021-05-26 DIAGNOSIS — J3089 Other allergic rhinitis: Secondary | ICD-10-CM | POA: Diagnosis not present

## 2021-05-26 DIAGNOSIS — J301 Allergic rhinitis due to pollen: Secondary | ICD-10-CM | POA: Diagnosis not present

## 2021-05-26 DIAGNOSIS — J3081 Allergic rhinitis due to animal (cat) (dog) hair and dander: Secondary | ICD-10-CM | POA: Diagnosis not present

## 2021-05-30 DIAGNOSIS — J301 Allergic rhinitis due to pollen: Secondary | ICD-10-CM | POA: Diagnosis not present

## 2021-05-30 DIAGNOSIS — L501 Idiopathic urticaria: Secondary | ICD-10-CM | POA: Diagnosis not present

## 2021-05-30 DIAGNOSIS — R059 Cough, unspecified: Secondary | ICD-10-CM | POA: Diagnosis not present

## 2021-05-30 DIAGNOSIS — R21 Rash and other nonspecific skin eruption: Secondary | ICD-10-CM | POA: Diagnosis not present

## 2021-06-07 DIAGNOSIS — J301 Allergic rhinitis due to pollen: Secondary | ICD-10-CM | POA: Diagnosis not present

## 2021-06-20 DIAGNOSIS — J3081 Allergic rhinitis due to animal (cat) (dog) hair and dander: Secondary | ICD-10-CM | POA: Diagnosis not present

## 2021-06-20 DIAGNOSIS — J301 Allergic rhinitis due to pollen: Secondary | ICD-10-CM | POA: Diagnosis not present

## 2021-06-20 DIAGNOSIS — J3089 Other allergic rhinitis: Secondary | ICD-10-CM | POA: Diagnosis not present

## 2021-06-26 DIAGNOSIS — H0288A Meibomian gland dysfunction right eye, upper and lower eyelids: Secondary | ICD-10-CM | POA: Diagnosis not present

## 2021-06-26 DIAGNOSIS — J3089 Other allergic rhinitis: Secondary | ICD-10-CM | POA: Diagnosis not present

## 2021-06-26 DIAGNOSIS — Z961 Presence of intraocular lens: Secondary | ICD-10-CM | POA: Diagnosis not present

## 2021-06-26 DIAGNOSIS — J3081 Allergic rhinitis due to animal (cat) (dog) hair and dander: Secondary | ICD-10-CM | POA: Diagnosis not present

## 2021-06-26 DIAGNOSIS — H04123 Dry eye syndrome of bilateral lacrimal glands: Secondary | ICD-10-CM | POA: Diagnosis not present

## 2021-06-26 DIAGNOSIS — J301 Allergic rhinitis due to pollen: Secondary | ICD-10-CM | POA: Diagnosis not present

## 2021-06-26 DIAGNOSIS — H0288B Meibomian gland dysfunction left eye, upper and lower eyelids: Secondary | ICD-10-CM | POA: Diagnosis not present

## 2021-06-26 DIAGNOSIS — L718 Other rosacea: Secondary | ICD-10-CM | POA: Diagnosis not present

## 2021-06-27 ENCOUNTER — Ambulatory Visit (INDEPENDENT_AMBULATORY_CARE_PROVIDER_SITE_OTHER): Payer: Medicare PPO | Admitting: Family Medicine

## 2021-06-27 ENCOUNTER — Encounter: Payer: Self-pay | Admitting: Family Medicine

## 2021-06-27 VITALS — BP 128/79 | HR 87 | Temp 97.2°F | Ht 63.0 in | Wt 159.6 lb

## 2021-06-27 DIAGNOSIS — F410 Panic disorder [episodic paroxysmal anxiety] without agoraphobia: Secondary | ICD-10-CM | POA: Diagnosis not present

## 2021-06-27 DIAGNOSIS — J4520 Mild intermittent asthma, uncomplicated: Secondary | ICD-10-CM | POA: Diagnosis not present

## 2021-06-27 DIAGNOSIS — E890 Postprocedural hypothyroidism: Secondary | ICD-10-CM | POA: Diagnosis not present

## 2021-06-27 DIAGNOSIS — F418 Other specified anxiety disorders: Secondary | ICD-10-CM | POA: Diagnosis not present

## 2021-06-27 DIAGNOSIS — I10 Essential (primary) hypertension: Secondary | ICD-10-CM | POA: Diagnosis not present

## 2021-06-27 DIAGNOSIS — G8929 Other chronic pain: Secondary | ICD-10-CM | POA: Diagnosis not present

## 2021-06-27 DIAGNOSIS — Z0001 Encounter for general adult medical examination with abnormal findings: Secondary | ICD-10-CM

## 2021-06-27 DIAGNOSIS — M25512 Pain in left shoulder: Secondary | ICD-10-CM | POA: Diagnosis not present

## 2021-06-27 DIAGNOSIS — E7800 Pure hypercholesterolemia, unspecified: Secondary | ICD-10-CM

## 2021-06-27 DIAGNOSIS — E559 Vitamin D deficiency, unspecified: Secondary | ICD-10-CM

## 2021-06-27 DIAGNOSIS — Z0000 Encounter for general adult medical examination without abnormal findings: Secondary | ICD-10-CM

## 2021-06-27 MED ORDER — ROSUVASTATIN CALCIUM 5 MG PO TABS: ORAL_TABLET | ORAL | 3 refills | Status: DC

## 2021-06-27 MED ORDER — VITAMIN D (ERGOCALCIFEROL) 1.25 MG (50000 UNIT) PO CAPS: ORAL_CAPSULE | ORAL | 3 refills | Status: DC

## 2021-06-27 MED ORDER — METHYLPREDNISOLONE ACETATE 40 MG/ML IJ SUSP
40.0000 mg | Freq: Once | INTRAMUSCULAR | Status: AC
  Administered 2021-06-27: 40 mg via INTRAMUSCULAR

## 2021-06-27 MED ORDER — LOSARTAN POTASSIUM-HCTZ 50-12.5 MG PO TABS: 1.0000 | ORAL_TABLET | Freq: Every day | ORAL | 3 refills | Status: DC

## 2021-06-27 MED ORDER — CITALOPRAM HYDROBROMIDE 20 MG PO TABS: 20.0000 mg | ORAL_TABLET | Freq: Every day | ORAL | 3 refills | Status: DC

## 2021-06-27 MED ORDER — CLONAZEPAM 0.5 MG PO TABS: 0.2500 mg | ORAL_TABLET | Freq: Two times a day (BID) | ORAL | 1 refills | Status: DC | PRN

## 2021-06-27 MED ORDER — LEVOTHYROXINE SODIUM 112 MCG PO TABS: ORAL_TABLET | ORAL | 3 refills | Status: DC
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
## 2021-06-27 NOTE — Patient Instructions (Signed)
You had labs performed today.  You will be contacted with the results of the labs once they are available, usually in the next 3 business days for routine lab work.  If you have an active my chart account, they will be released to your MyChart.  If you prefer to have these labs released to you via telephone, please let us know. ?  ?Health Maintenance, Female ?Adopting a healthy lifestyle and getting preventive care are important in promoting health and wellness. Ask your health care provider about: ?The right schedule for you to have regular tests and exams. ?Things you can do on your own to prevent diseases and keep yourself healthy. ?What should I know about diet, weight, and exercise? ?Eat a healthy diet ? ?Eat a diet that includes plenty of vegetables, fruits, low-fat dairy products, and lean protein. ?Do not eat a lot of foods that are high in solid fats, added sugars, or sodium. ?Maintain a healthy weight ?Body mass index (BMI) is used to identify weight problems. It estimates body fat based on height and weight. Your health care provider can help determine your BMI and help you achieve or maintain a healthy weight. ?Get regular exercise ?Get regular exercise. This is one of the most important things you can do for your health. Most adults should: ?Exercise for at least 150 minutes each week. The exercise should increase your heart rate and make you sweat (moderate-intensity exercise). ?Do strengthening exercises at least twice a week. This is in addition to the moderate-intensity exercise. ?Spend less time sitting. Even light physical activity can be beneficial. ?Watch cholesterol and blood lipids ?Have your blood tested for lipids and cholesterol at 68 years of age, then have this test every 5 years. ?Have your cholesterol levels checked more often if: ?Your lipid or cholesterol levels are high. ?You are older than 68 years of age. ?You are at high risk for heart disease. ?What should I know about cancer  screening? ?Depending on your health history and family history, you may need to have cancer screening at various ages. This may include screening for: ?Breast cancer. ?Cervical cancer. ?Colorectal cancer. ?Skin cancer. ?Lung cancer. ?What should I know about heart disease, diabetes, and high blood pressure? ?Blood pressure and heart disease ?High blood pressure causes heart disease and increases the risk of stroke. This is more likely to develop in people who have high blood pressure readings or are overweight. ?Have your blood pressure checked: ?Every 3-5 years if you are 108-8 years of age. ?Every year if you are 68 years old or older. ?Diabetes ?Have regular diabetes screenings. This checks your fasting blood sugar level. Have the screening done: ?Once every three years after age 44 if you are at a normal weight and have a low risk for diabetes. ?More often and at a younger age if you are overweight or have a high risk for diabetes. ?What should I know about preventing infection? ?Hepatitis B ?If you have a higher risk for hepatitis B, you should be screened for this virus. Talk with your health care provider to find out if you are at risk for hepatitis B infection. ?Hepatitis C ?Testing is recommended for: ?Everyone born from [birth year] through 1967. ?Anyone with known risk factors for hepatitis C. ?Sexually transmitted infections (STIs) ?Get screened for STIs, including gonorrhea and chlamydia, if: ?You are sexually active and are younger than 68 years of age. ?You are older than 68 years of age and your health care provider tells you that  you are at risk for this type of infection. ?Your sexual activity has changed since you were last screened, and you are at increased risk for chlamydia or gonorrhea. Ask your health care provider if you are at risk. ?Ask your health care provider about whether you are at high risk for HIV. Your health care provider may recommend a prescription medicine to help prevent HIV  infection. If you choose to take medicine to prevent HIV, you should first get tested for HIV. You should then be tested every 3 months for as long as you are taking the medicine. ?Pregnancy ?If you are about to stop having your period (premenopausal) and you may become pregnant, seek counseling before you get pregnant. ?Take 400 to 800 micrograms (mcg) of folic acid every day if you become pregnant. ?Ask for birth control (contraception) if you want to prevent pregnancy. ?Osteoporosis and menopause ?Osteoporosis is a disease in which the bones lose minerals and strength with aging. This can result in bone fractures. If you are 40 years old or older, or if you are at risk for osteoporosis and fractures, ask your health care provider if you should: ?Be screened for bone loss. ?Take a calcium or vitamin D supplement to lower your risk of fractures. ?Be given hormone replacement therapy (HRT) to treat symptoms of menopause. ?Follow these instructions at home: ?Alcohol use ?Do not drink alcohol if: ?Your health care provider tells you not to drink. ?You are pregnant, may be pregnant, or are planning to become pregnant. ?If you drink alcohol: ?Limit how much you have to: ?0-1 drink a day. ?Know how much alcohol is in your drink. In the [place] one drink equals one 12 oz bottle of beer (355 mL), one 5 oz glass of wine (148 mL), or one 1? oz glass of hard liquor (44 mL). ?Lifestyle ?Do not use any products that contain nicotine or tobacco. These products include cigarettes, chewing tobacco, and vaping devices, such as e-cigarettes. If you need help quitting, ask your health care provider. ?Do not use street drugs. ?Do not share needles. ?Ask your health care provider for help if you need support or information about quitting drugs. ?General instructions ?Schedule regular health, dental, and eye exams. ?Stay current with your vaccines. ?Tell your health care provider if: ?You often feel depressed. ?You have ever been abused  or do not feel safe at home. ?Summary ?Adopting a healthy lifestyle and getting preventive care are important in promoting health and wellness. ?Follow your health care provider's instructions about healthy diet, exercising, and getting tested or screened for diseases. ?Follow your health care provider's instructions on monitoring your cholesterol and blood pressure. ?This information is not intended to replace advice given to you by your health care provider. Make sure you discuss any questions you have with your health care provider. ?Document Revised: 07/11/2020 Document Reviewed: 07/11/2020 ?Elsevier Patient Education ? [employer]. ? ? ?

## 2021-06-28 LAB — CMP14+EGFR
ALT: 11 IU/L (ref 0–32)
AST: 14 IU/L (ref 0–40)
Albumin/Globulin Ratio: 2.4 — ABNORMAL HIGH (ref 1.2–2.2)
Albumin: 4.5 g/dL (ref 3.8–4.8)
Alkaline Phosphatase: 86 IU/L (ref 44–121)
BUN/Creatinine Ratio: 12 (ref 12–28)
BUN: 14 mg/dL (ref 8–27)
Bilirubin Total: 0.4 mg/dL (ref 0.0–1.2)
CO2: 24 mmol/L (ref 20–29)
Calcium: 9.4 mg/dL (ref 8.7–10.3)
Chloride: 100 mmol/L (ref 96–106)
Creatinine, Ser: 1.13 mg/dL — ABNORMAL HIGH (ref 0.57–1.00)
Globulin, Total: 1.9 g/dL (ref 1.5–4.5)
Glucose: 81 mg/dL (ref 70–99)
Potassium: 4.3 mmol/L (ref 3.5–5.2)
Sodium: 138 mmol/L (ref 134–144)
Total Protein: 6.4 g/dL (ref 6.0–8.5)
eGFR: 53 mL/min/{1.73_m2} — ABNORMAL LOW (ref >59)

## 2021-06-28 LAB — TSH: TSH: 1.31 u[IU]/mL (ref 0.450–4.500)

## 2021-06-28 LAB — SPECIMEN STATUS

## 2021-06-28 LAB — LIPID PANEL
Chol/HDL Ratio: 3.4 ratio (ref 0.0–4.4)
Cholesterol, Total: 162 mg/dL (ref 100–199)
HDL: 47 mg/dL (ref >39)
LDL Chol Calc (NIH): 89 mg/dL (ref 0–99)
Triglycerides: 150 mg/dL — ABNORMAL HIGH (ref 0–149)
VLDL Cholesterol Cal: 26 mg/dL (ref 5–40)

## 2021-06-28 LAB — T4, FREE: Free T4: 1.38 ng/dL (ref 0.82–1.77)

## 2021-07-02 ENCOUNTER — Other Ambulatory Visit: Payer: Self-pay | Admitting: Family Medicine

## 2021-07-02 DIAGNOSIS — E890 Postprocedural hypothyroidism: Secondary | ICD-10-CM

## 2021-07-07 DIAGNOSIS — M1712 Unilateral primary osteoarthritis, left knee: Secondary | ICD-10-CM | POA: Diagnosis not present

## 2021-07-07 DIAGNOSIS — J3089 Other allergic rhinitis: Secondary | ICD-10-CM | POA: Diagnosis not present

## 2021-07-07 DIAGNOSIS — J301 Allergic rhinitis due to pollen: Secondary | ICD-10-CM | POA: Diagnosis not present

## 2021-07-07 DIAGNOSIS — J3081 Allergic rhinitis due to animal (cat) (dog) hair and dander: Secondary | ICD-10-CM | POA: Diagnosis not present

## 2021-07-12 DIAGNOSIS — J3081 Allergic rhinitis due to animal (cat) (dog) hair and dander: Secondary | ICD-10-CM | POA: Diagnosis not present

## 2021-07-12 DIAGNOSIS — J3089 Other allergic rhinitis: Secondary | ICD-10-CM | POA: Diagnosis not present

## 2021-07-12 DIAGNOSIS — J301 Allergic rhinitis due to pollen: Secondary | ICD-10-CM | POA: Diagnosis not present

## 2021-07-18 DIAGNOSIS — J301 Allergic rhinitis due to pollen: Secondary | ICD-10-CM | POA: Diagnosis not present

## 2021-07-26 DIAGNOSIS — J3089 Other allergic rhinitis: Secondary | ICD-10-CM | POA: Diagnosis not present

## 2021-07-26 DIAGNOSIS — J3081 Allergic rhinitis due to animal (cat) (dog) hair and dander: Secondary | ICD-10-CM | POA: Diagnosis not present

## 2021-07-26 DIAGNOSIS — J301 Allergic rhinitis due to pollen: Secondary | ICD-10-CM | POA: Diagnosis not present

## 2021-08-01 ENCOUNTER — Ambulatory Visit (INDEPENDENT_AMBULATORY_CARE_PROVIDER_SITE_OTHER): Payer: Medicare PPO

## 2021-08-01 ENCOUNTER — Telehealth: Payer: Self-pay

## 2021-08-01 VITALS — Wt 159.0 lb

## 2021-08-01 DIAGNOSIS — L501 Idiopathic urticaria: Secondary | ICD-10-CM | POA: Insufficient documentation

## 2021-08-01 DIAGNOSIS — Z0000 Encounter for general adult medical examination without abnormal findings: Secondary | ICD-10-CM | POA: Diagnosis not present

## 2021-08-01 DIAGNOSIS — D649 Anemia, unspecified: Secondary | ICD-10-CM

## 2021-08-01 NOTE — Patient Instructions (Signed)
Julie [name] , Thank you for taking time to come for your Medicare Wellness Visit. I appreciate your ongoing commitment to your health goals. Please review the following plan we discussed and let me know if I can assist you in the future.   Screening recommendations/referrals: Colonoscopy: Done 01/25/2016 - Repeat in 5 years *make appointment with Dr Collene Mares soon Mammogram: Done 09/12/2020 - Repeat annually  Bone Density: Done 08/13/2016 - Repeat in 5 years *this year Recommended yearly ophthalmology/optometry visit for glaucoma screening and checkup Recommended yearly dental visit for hygiene and checkup  Vaccinations: Influenza vaccine: Done 05/30/2021 - repeat in fall Pneumococcal vaccine: Done  01/19/2014, 09/26/2020, & 05/30/2021 Tdap vaccine: Done 2010 - Repeat in 10 years *due Shingles vaccine: Zostavax done 2016 - Due for Shingrix which is 2 doses 2-6 months apart and over 90% effective     Covid-19: Done 04/10/2019, 05/08/2019, & 01/21/2020  Advanced directives: Advance directive discussed with you today. Even though you declined this today, please call our office should you change your mind, and we can give you the proper paperwork for you to fill out.   Conditions/risks identified: Aim for 30 minutes of exercise or brisk walking, 6-8 glasses of water, and 5 servings of fruits and vegetables each day. Read the information at the end of this summary regarding Chronic kidney disease.  Next appointment: Follow up in one year for your annual wellness visit    Preventive Care 65 Years and Older, Female Preventive care refers to lifestyle choices and visits with your health care provider that can promote health and wellness. What does preventive care include? A yearly physical exam. This is also called an annual well check. Dental exams once or twice a year. Routine eye exams. Ask your health care provider how often you should have your eyes checked. Personal lifestyle choices, including: Daily  care of your teeth and gums. Regular physical activity. Eating a healthy diet. Avoiding tobacco and drug use. Limiting alcohol use. Practicing safe sex. Taking low-dose aspirin every day. Taking vitamin and mineral supplements as recommended by your health care provider. What happens during an annual well check? The services and screenings done by your health care provider during your annual well check will depend on your age, overall health, lifestyle risk factors, and family history of disease. Counseling  Your health care provider may ask you questions about your: Alcohol use. Tobacco use. Drug use. Emotional well-being. Home and relationship well-being. Sexual activity. Eating habits. History of falls. Memory and ability to understand (cognition). Work and work Statistician. Reproductive health. Screening  You may have the following tests or measurements: Height, weight, and BMI. Blood pressure. Lipid and cholesterol levels. These may be checked every 5 years, or more frequently if you are over 3 years old. Skin check. Lung cancer screening. You may have this screening every year starting at age 54 if you have a 30-pack-year history of smoking and currently smoke or have quit within the past 15 years. Fecal occult blood test (FOBT) of the stool. You may have this test every year starting at age 86. Flexible sigmoidoscopy or colonoscopy. You may have a sigmoidoscopy every 5 years or a colonoscopy every 10 years starting at age 31. Hepatitis C blood test. Hepatitis B blood test. Sexually transmitted disease (STD) testing. Diabetes screening. This is done by checking your blood sugar (glucose) after you have not eaten for a while (fasting). You may have this done every 1-3 years. Bone density scan. This is done to  screen for osteoporosis. You may have this done starting at age 7. Mammogram. This may be done every 1-2 years. Talk to your health care provider about how often you  should have regular mammograms. Talk with your health care provider about your test results, treatment options, and if necessary, the need for more tests. Vaccines  Your health care provider may recommend certain vaccines, such as: Influenza vaccine. This is recommended every year. Tetanus, diphtheria, and acellular pertussis (Tdap, Td) vaccine. You may need a Td booster every 10 years. Zoster vaccine. You may need this after age 34. Pneumococcal 13-valent conjugate (PCV13) vaccine. One dose is recommended after age 48. Pneumococcal polysaccharide (PPSV23) vaccine. One dose is recommended after age 11. Talk to your health care provider about which screenings and vaccines you need and how often you need them. This information is not intended to replace advice given to you by your health care provider. Make sure you discuss any questions you have with your health care provider. Document Released: 03/18/2015 Document Revised: 11/09/2015 Document Reviewed: 12/21/2014 Elsevier Interactive Patient Education  2017 [place] Prevention in the Home Falls can cause injuries. They can happen to people of all ages. There are many things you can do to make your home safe and to help prevent falls. What can I do on the outside of my home? Regularly fix the edges of walkways and driveways and fix any cracks. Remove anything that might make you trip as you walk through a door, such as a raised step or threshold. Trim any bushes or trees on the path to your home. Use bright outdoor lighting. Clear any walking paths of anything that might make someone trip, such as rocks or tools. Regularly check to see if handrails are loose or broken. Make sure that both sides of any steps have handrails. Any raised decks and porches should have guardrails on the edges. Have any leaves, snow, or ice cleared regularly. Use sand or salt on walking paths during winter. Clean up any spills in your garage right away.  This includes oil or grease spills. What can I do in the bathroom? Use night lights. Install grab bars by the toilet and in the tub and shower. Do not use towel bars as grab bars. Use non-skid mats or decals in the tub or shower. If you need to sit down in the shower, use a plastic, non-slip stool. Keep the floor dry. Clean up any water that spills on the floor as soon as it happens. Remove soap buildup in the tub or shower regularly. Attach bath mats securely with double-sided non-slip rug tape. Do not have throw rugs and other things on the floor that can make you trip. What can I do in the bedroom? Use night lights. Make sure that you have a light by your bed that is easy to reach. Do not use any sheets or blankets that are too big for your bed. They should not hang down onto the floor. Have a firm chair that has side arms. You can use this for support while you get dressed. Do not have throw rugs and other things on the floor that can make you trip. What can I do in the kitchen? Clean up any spills right away. Avoid walking on wet floors. Keep items that you use a lot in easy-to-reach places. If you need to reach something above you, use a strong step stool that has a grab bar. Keep electrical cords out of the way.  Do not use floor polish or wax that makes floors slippery. If you must use wax, use non-skid floor wax. Do not have throw rugs and other things on the floor that can make you trip. What can I do with my stairs? Do not leave any items on the stairs. Make sure that there are handrails on both sides of the stairs and use them. Fix handrails that are broken or loose. Make sure that handrails are as long as the stairways. Check any carpeting to make sure that it is firmly attached to the stairs. Fix any carpet that is loose or worn. Avoid having throw rugs at the top or bottom of the stairs. If you do have throw rugs, attach them to the floor with carpet tape. Make sure that  you have a light switch at the top of the stairs and the bottom of the stairs. If you do not have them, ask someone to add them for you. What else can I do to help prevent falls? Wear shoes that: Do not have high heels. Have rubber bottoms. Are comfortable and fit you well. Are closed at the toe. Do not wear sandals. If you use a stepladder: Make sure that it is fully opened. Do not climb a closed stepladder. Make sure that both sides of the stepladder are locked into place. Ask someone to hold it for you, if possible. Clearly mark and make sure that you can see: Any grab bars or handrails. First and last steps. Where the edge of each step is. Use tools that help you move around (mobility aids) if they are needed. These include: Canes. Walkers. Scooters. Crutches. Turn on the lights when you go into a dark area. Replace any light bulbs as soon as they burn out. Set up your furniture so you have a clear path. Avoid moving your furniture around. If any of your floors are uneven, fix them. If there are any pets around you, be aware of where they are. Review your medicines with your doctor. Some medicines can make you feel dizzy. This can increase your chance of falling. Ask your doctor what other things that you can do to help prevent falls. This information is not intended to replace advice given to you by your health care provider. Make sure you discuss any questions you have with your health care provider. Document Released: 12/16/2008 Document Revised: 07/28/2015 Document Reviewed: 03/26/2014 Elsevier Interactive Patient Education  2017 [employer]   Chronic Kidney Disease, Adult Chronic kidney disease (CKD) occurs when the kidneys are slowly and permanently damaged over a long period of time. The kidneys are a pair of organs that do many important jobs in the body, including: Removing waste and extra fluid from the blood to make urine. Making hormones that maintain the amount of  fluid in tissues and blood vessels. Maintaining the right amount of fluids and chemicals in the body. A small amount of kidney damage may not cause problems, but a large amount of damage may make it hard or impossible for the kidneys to work right. Steps must be taken to slow kidney damage or to stop it from getting worse. If steps are not taken, the kidneys may stop working permanently (end-stage renal disease, or ESRD). Most of the time, CKD does not go away, but it can often be controlled. People who have CKD are usually able to live full lives. What are the causes? The most common causes of this condition are diabetes and high blood pressure (  hypertension). Other causes include: Cardiovascular diseases. These affect the heart and blood vessels. Kidney diseases. These include: Glomerulonephritis, or inflammation of the tiny filters in the kidneys. Interstitial nephritis. This is swelling of the small tubes of the kidneys and of the surrounding structures. Polycystic kidney disease, in which clusters of fluid-filled sacs form within the kidneys. Renal vascular disease. This includes disorders that affect the arteries and veins of the kidneys. Diseases that affect the body's defense system (immune system). A problem with urine flow. This may be caused by: Kidney stones. Cancer. An enlarged prostate, in males. A kidney infection or urinary tract infection (UTI) that keeps coming back. Vasculitis. This is swelling or inflammation of the blood vessels. What increases the risk? Your chances of having kidney disease increase with age. The following factors may make you more likely to develop this condition: A family history of kidney disease or kidney failure. Kidney failure means the kidneys can no longer work right. Certain genetic diseases. Taking medicines often that are damaging to the kidneys. Being around or being in contact with toxic substances. Obesity. A history of tobacco  use. What are the signs or symptoms? Symptoms of this condition include: Feeling very tired (lethargic) and having less energy. Swelling, or edema, of the face, legs, ankles, or feet. Nausea or vomiting, or loss of appetite. Confusion or trouble concentrating. Muscle twitches and cramps, especially in the legs. Dry, itchy skin. A metallic taste in the mouth. Producing less urine, or producing more urine (especially at night). Shortness of breath. Trouble sleeping. CKD may also result in not having enough red blood cells or hemoglobin in the blood (anemia) or having weak bones (bone disease). Symptoms develop slowly and may not be obvious until the kidney damage becomes severe. It is possible to have kidney disease for years without having symptoms. How is this diagnosed? This condition may be diagnosed based on: Blood tests. Urine tests. Imaging tests, such as an ultrasound or a CT scan. A kidney biopsy. This involves removing a sample of kidney tissue to be looked at under a microscope. Results from these tests will help to determine how serious the CKD is. How is this treated? There is no cure for most cases of this condition, but treatment usually relieves symptoms and prevents or slows the worsening of the disease. Treatment may include: Diet changes, which may require you to avoid alcohol and foods that are high in salt, potassium, phosphorous, and protein. Medicines. These may: Lower blood pressure. Control blood sugar (glucose). Relieve anemia. Relieve swelling. Protect your bones. Improve the balance of salts and minerals in your blood (electrolytes). Dialysis, which is a type of treatment that removes toxic waste from the body. It may be needed if you have kidney failure. Managing any other conditions that are causing your CKD or making it worse. Follow these instructions at home: Medicines Take over-the-counter and prescription medicines only as told by your health care  provider. The amount of some medicines that you take may need to be changed. Do not take any new medicines unless approved by your health care provider. Many medicines can make kidney damage worse. Do not take any vitamin and mineral supplements unless approved by your health care provider. Many nutritional supplements can make kidney damage worse. Lifestyle  Do not use any products that contain nicotine or tobacco, such as cigarettes, e-cigarettes, and chewing tobacco. If you need help quitting, ask your health care provider. If you drink alcohol: Limit how much you use to:  0-1 drink a day for women who are not pregnant. 0-2 drinks a day for men. Know how much alcohol is in your drink. In the [place] one drink equals one 12 oz bottle of beer (355 mL), one 5 oz glass of wine (148 mL), or one 1 oz glass of hard liquor (44 mL). Maintain a healthy weight. If you need help, ask your health care provider. General instructions  Follow instructions from your health care provider about eating or drinking restrictions, including any prescribed diet. Track your blood pressure at home. Report changes in your blood pressure as told. If you are being treated for diabetes, track your blood glucose levels as told. Start or continue an exercise plan. Exercise at least 30 minutes a day, 5 days a week. Keep your immunizations up to date as told. Keep all follow-up visits. This is important. Where to find more information American Association of Kidney Patients: BombTimer.gl National Kidney Foundation: www.kidney.[place]: https://mathis.com/ Life Options: www.lifeoptions.org Kidney School: www.kidneyschool.org Contact a health care provider if: Your symptoms get worse. You develop new symptoms. Get help right away if: You develop symptoms of ESRD. These include: Headaches. Numbness in your hands or feet. Easy bruising. Frequent hiccups. Chest pain. Shortness of breath. Lack of menstrual  periods, in women. You have a fever. You are producing less urine than usual. You have pain or bleeding when you urinate or when you have a bowel movement. These symptoms may represent a serious problem that is an emergency. Do not wait to see if the symptoms will go away. Get medical help right away. Call your local emergency services (911 in the [place] Do not drive yourself to the hospital. Summary Chronic kidney disease (CKD) occurs when the kidneys become damaged slowly over a long period of time. The most common causes of this condition are diabetes and high blood pressure (hypertension). There is no cure for most cases of CKD, but treatment usually relieves symptoms and prevents or slows the worsening of the disease. Treatment may include a combination of lifestyle changes, medicines, and dialysis. This information is not intended to replace advice given to you by your health care provider. Make sure you discuss any questions you have with your health care provider. Document Revised: 05/27/2019 Document Reviewed: 05/27/2019 Elsevier Patient Education  [place].
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
## 2021-08-01 NOTE — Telephone Encounter (Signed)
Pt aware, states she will think about referral. Ad will come back in for cbc

## 2021-08-01 NOTE — Telephone Encounter (Signed)
She wants to know if she needs referral to nephrology for her impaired renal function; Never got results for CBC - does she need to come in for this? Also wants to know if Trelegy could cause her triglycerides to be elevated. Thanks

## 2021-08-02 ENCOUNTER — Other Ambulatory Visit: Payer: Medicare PPO

## 2021-08-02 DIAGNOSIS — D649 Anemia, unspecified: Secondary | ICD-10-CM

## 2021-08-02 DIAGNOSIS — J3089 Other allergic rhinitis: Secondary | ICD-10-CM | POA: Diagnosis not present

## 2021-08-02 DIAGNOSIS — J301 Allergic rhinitis due to pollen: Secondary | ICD-10-CM | POA: Diagnosis not present

## 2021-08-02 DIAGNOSIS — J3081 Allergic rhinitis due to animal (cat) (dog) hair and dander: Secondary | ICD-10-CM | POA: Diagnosis not present

## 2021-08-03 LAB — CBC WITH DIFFERENTIAL/PLATELET
Basophils Absolute: 0 10*3/uL (ref 0.0–0.2)
Basos: 1 %
EOS (ABSOLUTE): 0.2 10*3/uL (ref 0.0–0.4)
Eos: 3 %
Hematocrit: 41.5 % (ref 34.0–46.6)
Hemoglobin: 13.9 g/dL (ref 11.1–15.9)
Immature Grans (Abs): 0 10*3/uL (ref 0.0–0.1)
Immature Granulocytes: 0 %
Lymphocytes Absolute: 1.2 10*3/uL (ref 0.7–3.1)
Lymphs: 23 %
MCH: 29.3 pg (ref 26.6–33.0)
MCHC: 33.5 g/dL (ref 31.5–35.7)
MCV: 88 fL (ref 79–97)
Monocytes Absolute: 0.4 10*3/uL (ref 0.1–0.9)
Monocytes: 7 %
Neutrophils Absolute: 3.5 10*3/uL (ref 1.4–7.0)
Neutrophils: 66 %
Platelets: 281 10*3/uL (ref 150–450)
RBC: 4.74 x10E6/uL (ref 3.77–5.28)
RDW: 13.1 % (ref 11.7–15.4)
WBC: 5.3 10*3/uL (ref 3.4–10.8)

## 2021-08-11 DIAGNOSIS — J3089 Other allergic rhinitis: Secondary | ICD-10-CM | POA: Diagnosis not present

## 2021-08-11 DIAGNOSIS — J3081 Allergic rhinitis due to animal (cat) (dog) hair and dander: Secondary | ICD-10-CM | POA: Diagnosis not present

## 2021-08-11 DIAGNOSIS — J301 Allergic rhinitis due to pollen: Secondary | ICD-10-CM | POA: Diagnosis not present

## 2021-08-17 DIAGNOSIS — J301 Allergic rhinitis due to pollen: Secondary | ICD-10-CM | POA: Diagnosis not present

## 2021-08-17 DIAGNOSIS — J3081 Allergic rhinitis due to animal (cat) (dog) hair and dander: Secondary | ICD-10-CM | POA: Diagnosis not present

## 2021-08-17 DIAGNOSIS — J3089 Other allergic rhinitis: Secondary | ICD-10-CM | POA: Diagnosis not present

## 2021-08-25 DIAGNOSIS — J301 Allergic rhinitis due to pollen: Secondary | ICD-10-CM | POA: Diagnosis not present

## 2021-08-28 DIAGNOSIS — J301 Allergic rhinitis due to pollen: Secondary | ICD-10-CM | POA: Diagnosis not present

## 2021-08-31 DIAGNOSIS — H9042 Sensorineural hearing loss, unilateral, left ear, with unrestricted hearing on the contralateral side: Secondary | ICD-10-CM | POA: Diagnosis not present

## 2021-08-31 DIAGNOSIS — J3089 Other allergic rhinitis: Secondary | ICD-10-CM | POA: Diagnosis not present

## 2021-08-31 DIAGNOSIS — J301 Allergic rhinitis due to pollen: Secondary | ICD-10-CM | POA: Diagnosis not present

## 2021-08-31 DIAGNOSIS — J3081 Allergic rhinitis due to animal (cat) (dog) hair and dander: Secondary | ICD-10-CM | POA: Diagnosis not present

## 2021-09-06 DIAGNOSIS — J3081 Allergic rhinitis due to animal (cat) (dog) hair and dander: Secondary | ICD-10-CM | POA: Diagnosis not present

## 2021-09-06 DIAGNOSIS — J3089 Other allergic rhinitis: Secondary | ICD-10-CM | POA: Diagnosis not present

## 2021-09-06 DIAGNOSIS — J301 Allergic rhinitis due to pollen: Secondary | ICD-10-CM | POA: Diagnosis not present

## 2021-09-15 DIAGNOSIS — J3081 Allergic rhinitis due to animal (cat) (dog) hair and dander: Secondary | ICD-10-CM | POA: Diagnosis not present

## 2021-09-15 DIAGNOSIS — J3089 Other allergic rhinitis: Secondary | ICD-10-CM | POA: Diagnosis not present

## 2021-09-15 DIAGNOSIS — J301 Allergic rhinitis due to pollen: Secondary | ICD-10-CM | POA: Diagnosis not present

## 2021-09-19 DIAGNOSIS — J301 Allergic rhinitis due to pollen: Secondary | ICD-10-CM | POA: Diagnosis not present

## 2021-09-19 DIAGNOSIS — J3081 Allergic rhinitis due to animal (cat) (dog) hair and dander: Secondary | ICD-10-CM | POA: Diagnosis not present

## 2021-09-19 DIAGNOSIS — J3089 Other allergic rhinitis: Secondary | ICD-10-CM | POA: Diagnosis not present

## 2021-09-22 DIAGNOSIS — J3081 Allergic rhinitis due to animal (cat) (dog) hair and dander: Secondary | ICD-10-CM | POA: Diagnosis not present

## 2021-09-22 DIAGNOSIS — J3089 Other allergic rhinitis: Secondary | ICD-10-CM | POA: Diagnosis not present

## 2021-09-22 DIAGNOSIS — J301 Allergic rhinitis due to pollen: Secondary | ICD-10-CM | POA: Diagnosis not present

## 2021-09-25 DIAGNOSIS — J301 Allergic rhinitis due to pollen: Secondary | ICD-10-CM | POA: Diagnosis not present

## 2021-10-02 DIAGNOSIS — J301 Allergic rhinitis due to pollen: Secondary | ICD-10-CM | POA: Diagnosis not present

## 2021-10-09 DIAGNOSIS — J3081 Allergic rhinitis due to animal (cat) (dog) hair and dander: Secondary | ICD-10-CM | POA: Diagnosis not present

## 2021-10-09 DIAGNOSIS — J301 Allergic rhinitis due to pollen: Secondary | ICD-10-CM | POA: Diagnosis not present

## 2021-10-09 DIAGNOSIS — J3089 Other allergic rhinitis: Secondary | ICD-10-CM | POA: Diagnosis not present

## 2021-10-16 DIAGNOSIS — J3089 Other allergic rhinitis: Secondary | ICD-10-CM | POA: Diagnosis not present

## 2021-10-16 DIAGNOSIS — J301 Allergic rhinitis due to pollen: Secondary | ICD-10-CM | POA: Diagnosis not present

## 2021-10-16 DIAGNOSIS — J3081 Allergic rhinitis due to animal (cat) (dog) hair and dander: Secondary | ICD-10-CM | POA: Diagnosis not present

## 2021-10-23 DIAGNOSIS — J301 Allergic rhinitis due to pollen: Secondary | ICD-10-CM | POA: Diagnosis not present

## 2021-10-30 DIAGNOSIS — J3089 Other allergic rhinitis: Secondary | ICD-10-CM | POA: Diagnosis not present

## 2021-10-30 DIAGNOSIS — J3081 Allergic rhinitis due to animal (cat) (dog) hair and dander: Secondary | ICD-10-CM | POA: Diagnosis not present

## 2021-10-30 DIAGNOSIS — J301 Allergic rhinitis due to pollen: Secondary | ICD-10-CM | POA: Diagnosis not present

## 2021-11-07 DIAGNOSIS — J301 Allergic rhinitis due to pollen: Secondary | ICD-10-CM | POA: Diagnosis not present

## 2021-11-07 DIAGNOSIS — J3081 Allergic rhinitis due to animal (cat) (dog) hair and dander: Secondary | ICD-10-CM | POA: Diagnosis not present

## 2021-11-07 DIAGNOSIS — J3089 Other allergic rhinitis: Secondary | ICD-10-CM | POA: Diagnosis not present

## 2021-11-15 DIAGNOSIS — J301 Allergic rhinitis due to pollen: Secondary | ICD-10-CM | POA: Diagnosis not present

## 2021-11-21 DIAGNOSIS — J301 Allergic rhinitis due to pollen: Secondary | ICD-10-CM | POA: Diagnosis not present

## 2021-11-21 DIAGNOSIS — J3081 Allergic rhinitis due to animal (cat) (dog) hair and dander: Secondary | ICD-10-CM | POA: Diagnosis not present

## 2021-11-21 DIAGNOSIS — J3089 Other allergic rhinitis: Secondary | ICD-10-CM | POA: Diagnosis not present

## 2021-11-29 DIAGNOSIS — J3089 Other allergic rhinitis: Secondary | ICD-10-CM | POA: Diagnosis not present

## 2021-11-29 DIAGNOSIS — J301 Allergic rhinitis due to pollen: Secondary | ICD-10-CM | POA: Diagnosis not present

## 2021-11-29 DIAGNOSIS — J3081 Allergic rhinitis due to animal (cat) (dog) hair and dander: Secondary | ICD-10-CM | POA: Diagnosis not present

## 2021-12-06 DIAGNOSIS — J301 Allergic rhinitis due to pollen: Secondary | ICD-10-CM | POA: Diagnosis not present

## 2021-12-13 DIAGNOSIS — J3081 Allergic rhinitis due to animal (cat) (dog) hair and dander: Secondary | ICD-10-CM | POA: Diagnosis not present

## 2021-12-21 DIAGNOSIS — J301 Allergic rhinitis due to pollen: Secondary | ICD-10-CM | POA: Diagnosis not present

## 2021-12-21 DIAGNOSIS — J3089 Other allergic rhinitis: Secondary | ICD-10-CM | POA: Diagnosis not present

## 2021-12-21 DIAGNOSIS — J3081 Allergic rhinitis due to animal (cat) (dog) hair and dander: Secondary | ICD-10-CM | POA: Diagnosis not present

## 2021-12-22 DIAGNOSIS — M1712 Unilateral primary osteoarthritis, left knee: Secondary | ICD-10-CM | POA: Diagnosis not present

## 2021-12-27 ENCOUNTER — Ambulatory Visit (INDEPENDENT_AMBULATORY_CARE_PROVIDER_SITE_OTHER): Payer: Medicare PPO

## 2021-12-27 ENCOUNTER — Encounter: Payer: Self-pay | Admitting: Family Medicine

## 2021-12-27 ENCOUNTER — Ambulatory Visit: Payer: Medicare PPO | Admitting: Family Medicine

## 2021-12-27 VITALS — BP 128/76 | HR 78 | Temp 97.8°F | Ht 63.0 in | Wt 157.8 lb

## 2021-12-27 DIAGNOSIS — J45909 Unspecified asthma, uncomplicated: Secondary | ICD-10-CM | POA: Diagnosis not present

## 2021-12-27 DIAGNOSIS — Z79899 Other long term (current) drug therapy: Secondary | ICD-10-CM

## 2021-12-27 DIAGNOSIS — N1831 Chronic kidney disease, stage 3a: Secondary | ICD-10-CM | POA: Diagnosis not present

## 2021-12-27 DIAGNOSIS — F410 Panic disorder [episodic paroxysmal anxiety] without agoraphobia: Secondary | ICD-10-CM | POA: Diagnosis not present

## 2021-12-27 DIAGNOSIS — Z780 Asymptomatic menopausal state: Secondary | ICD-10-CM

## 2021-12-27 DIAGNOSIS — E890 Postprocedural hypothyroidism: Secondary | ICD-10-CM

## 2021-12-27 DIAGNOSIS — F418 Other specified anxiety disorders: Secondary | ICD-10-CM

## 2021-12-27 DIAGNOSIS — Z01818 Encounter for other preprocedural examination: Secondary | ICD-10-CM

## 2021-12-27 DIAGNOSIS — J301 Allergic rhinitis due to pollen: Secondary | ICD-10-CM | POA: Diagnosis not present

## 2021-12-27 DIAGNOSIS — J4520 Mild intermittent asthma, uncomplicated: Secondary | ICD-10-CM

## 2021-12-27 DIAGNOSIS — J3089 Other allergic rhinitis: Secondary | ICD-10-CM | POA: Diagnosis not present

## 2021-12-27 DIAGNOSIS — J3081 Allergic rhinitis due to animal (cat) (dog) hair and dander: Secondary | ICD-10-CM | POA: Diagnosis not present

## 2021-12-27 DIAGNOSIS — R4589 Other symptoms and signs involving emotional state: Secondary | ICD-10-CM

## 2021-12-27 LAB — COAGUCHEK XS/INR WAIVED
INR: 0.9 (ref 0.9–1.1)
Prothrombin Time: 11.3 s

## 2021-12-27 LAB — URINALYSIS
Bilirubin, UA: NEGATIVE
Glucose, UA: NEGATIVE
Ketones, UA: NEGATIVE
Leukocytes,UA: NEGATIVE
Nitrite, UA: NEGATIVE
Protein,UA: NEGATIVE
RBC, UA: NEGATIVE
Specific Gravity, UA: 1.015 (ref 1.005–1.030)
Urobilinogen, Ur: 0.2 mg/dL (ref 0.2–1.0)
pH, UA: 7.5 (ref 5.0–7.5)

## 2021-12-27 MED ORDER — CLONAZEPAM 0.5 MG PO TABS: 0.2500 mg | ORAL_TABLET | Freq: Two times a day (BID) | ORAL | 1 refills | Status: DC | PRN
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
## 2021-12-28 DIAGNOSIS — Z780 Asymptomatic menopausal state: Secondary | ICD-10-CM | POA: Diagnosis not present

## 2021-12-28 LAB — CMP14+EGFR
ALT: 15 IU/L (ref 0–32)
AST: 16 IU/L (ref 0–40)
Albumin/Globulin Ratio: 1.9 (ref 1.2–2.2)
Albumin: 4.5 g/dL (ref 3.9–4.9)
Alkaline Phosphatase: 91 IU/L (ref 44–121)
BUN/Creatinine Ratio: 14 (ref 12–28)
BUN: 14 mg/dL (ref 8–27)
Bilirubin Total: 0.4 mg/dL (ref 0.0–1.2)
CO2: 23 mmol/L (ref 20–29)
Calcium: 9.5 mg/dL (ref 8.7–10.3)
Chloride: 95 mmol/L — ABNORMAL LOW (ref 96–106)
Creatinine, Ser: 0.97 mg/dL (ref 0.57–1.00)
Globulin, Total: 2.4 g/dL (ref 1.5–4.5)
Glucose: 83 mg/dL (ref 70–99)
Potassium: 4.1 mmol/L (ref 3.5–5.2)
Sodium: 135 mmol/L (ref 134–144)
Total Protein: 6.9 g/dL (ref 6.0–8.5)
eGFR: 64 mL/min/{1.73_m2} (ref >59)

## 2021-12-28 LAB — CBC
Hematocrit: 40.6 % (ref 34.0–46.6)
Hemoglobin: 13.7 g/dL (ref 11.1–15.9)
MCH: 28.7 pg (ref 26.6–33.0)
MCHC: 33.7 g/dL (ref 31.5–35.7)
MCV: 85 fL (ref 79–97)
Platelets: 294 10*3/uL (ref 150–450)
RBC: 4.78 x10E6/uL (ref 3.77–5.28)
RDW: 12.8 % (ref 11.7–15.4)
WBC: 5.3 10*3/uL (ref 3.4–10.8)

## 2021-12-28 LAB — T4, FREE: Free T4: 1.33 ng/dL (ref 0.82–1.77)

## 2021-12-28 LAB — TSH: TSH: 1.52 u[IU]/mL (ref 0.450–4.500)

## 2021-12-28 LAB — VITAMIN D 25 HYDROXY (VIT D DEFICIENCY, FRACTURES): Vit D, 25-Hydroxy: 86.1 ng/mL (ref 30.0–100.0)

## 2022-01-02 LAB — TOXASSURE SELECT 13 (MW), URINE

## 2022-01-04 DIAGNOSIS — J3081 Allergic rhinitis due to animal (cat) (dog) hair and dander: Secondary | ICD-10-CM | POA: Diagnosis not present

## 2022-01-04 DIAGNOSIS — J3089 Other allergic rhinitis: Secondary | ICD-10-CM | POA: Diagnosis not present

## 2022-01-04 DIAGNOSIS — J301 Allergic rhinitis due to pollen: Secondary | ICD-10-CM | POA: Diagnosis not present

## 2022-01-11 DIAGNOSIS — J3089 Other allergic rhinitis: Secondary | ICD-10-CM | POA: Diagnosis not present

## 2022-01-11 DIAGNOSIS — J3081 Allergic rhinitis due to animal (cat) (dog) hair and dander: Secondary | ICD-10-CM | POA: Diagnosis not present

## 2022-01-11 DIAGNOSIS — M25562 Pain in left knee: Secondary | ICD-10-CM | POA: Diagnosis not present

## 2022-01-11 DIAGNOSIS — J301 Allergic rhinitis due to pollen: Secondary | ICD-10-CM | POA: Diagnosis not present

## 2022-01-17 DIAGNOSIS — J3089 Other allergic rhinitis: Secondary | ICD-10-CM | POA: Diagnosis not present

## 2022-01-17 DIAGNOSIS — J301 Allergic rhinitis due to pollen: Secondary | ICD-10-CM | POA: Diagnosis not present

## 2022-01-17 DIAGNOSIS — J3081 Allergic rhinitis due to animal (cat) (dog) hair and dander: Secondary | ICD-10-CM | POA: Diagnosis not present

## 2022-01-24 DIAGNOSIS — J3081 Allergic rhinitis due to animal (cat) (dog) hair and dander: Secondary | ICD-10-CM | POA: Diagnosis not present

## 2022-01-24 DIAGNOSIS — J3089 Other allergic rhinitis: Secondary | ICD-10-CM | POA: Diagnosis not present

## 2022-01-24 DIAGNOSIS — J301 Allergic rhinitis due to pollen: Secondary | ICD-10-CM | POA: Diagnosis not present

## 2022-01-31 DIAGNOSIS — J301 Allergic rhinitis due to pollen: Secondary | ICD-10-CM | POA: Diagnosis not present

## 2022-02-05 DIAGNOSIS — M25562 Pain in left knee: Secondary | ICD-10-CM | POA: Diagnosis not present

## 2022-02-07 DIAGNOSIS — J3089 Other allergic rhinitis: Secondary | ICD-10-CM | POA: Diagnosis not present

## 2022-02-07 DIAGNOSIS — J301 Allergic rhinitis due to pollen: Secondary | ICD-10-CM | POA: Diagnosis not present

## 2022-02-07 DIAGNOSIS — J3081 Allergic rhinitis due to animal (cat) (dog) hair and dander: Secondary | ICD-10-CM | POA: Diagnosis not present

## 2022-02-14 DIAGNOSIS — J301 Allergic rhinitis due to pollen: Secondary | ICD-10-CM | POA: Diagnosis not present

## 2022-02-14 DIAGNOSIS — J3089 Other allergic rhinitis: Secondary | ICD-10-CM | POA: Diagnosis not present

## 2022-02-14 DIAGNOSIS — J3081 Allergic rhinitis due to animal (cat) (dog) hair and dander: Secondary | ICD-10-CM | POA: Diagnosis not present

## 2022-02-15 DIAGNOSIS — J301 Allergic rhinitis due to pollen: Secondary | ICD-10-CM | POA: Diagnosis not present

## 2022-02-21 DIAGNOSIS — J3081 Allergic rhinitis due to animal (cat) (dog) hair and dander: Secondary | ICD-10-CM | POA: Diagnosis not present

## 2022-02-21 DIAGNOSIS — J3089 Other allergic rhinitis: Secondary | ICD-10-CM | POA: Diagnosis not present

## 2022-02-21 DIAGNOSIS — J301 Allergic rhinitis due to pollen: Secondary | ICD-10-CM | POA: Diagnosis not present

## 2022-02-28 DIAGNOSIS — J301 Allergic rhinitis due to pollen: Secondary | ICD-10-CM | POA: Diagnosis not present

## 2022-02-28 DIAGNOSIS — J3089 Other allergic rhinitis: Secondary | ICD-10-CM | POA: Diagnosis not present

## 2022-02-28 DIAGNOSIS — J3081 Allergic rhinitis due to animal (cat) (dog) hair and dander: Secondary | ICD-10-CM | POA: Diagnosis not present

## 2022-03-07 DIAGNOSIS — J3081 Allergic rhinitis due to animal (cat) (dog) hair and dander: Secondary | ICD-10-CM | POA: Diagnosis not present

## 2022-03-07 DIAGNOSIS — J301 Allergic rhinitis due to pollen: Secondary | ICD-10-CM | POA: Diagnosis not present

## 2022-03-07 DIAGNOSIS — J3089 Other allergic rhinitis: Secondary | ICD-10-CM | POA: Diagnosis not present

## 2022-03-14 DIAGNOSIS — J3089 Other allergic rhinitis: Secondary | ICD-10-CM | POA: Diagnosis not present

## 2022-03-14 DIAGNOSIS — J301 Allergic rhinitis due to pollen: Secondary | ICD-10-CM | POA: Diagnosis not present

## 2022-03-14 DIAGNOSIS — J3081 Allergic rhinitis due to animal (cat) (dog) hair and dander: Secondary | ICD-10-CM | POA: Diagnosis not present

## 2022-03-15 NOTE — Progress Notes (Signed)
Surgery orders requested via Epic inbox. °

## 2022-03-16 ENCOUNTER — Ambulatory Visit: Payer: Self-pay | Admitting: Student

## 2022-03-21 NOTE — Progress Notes (Addendum)
COVID Vaccine received:  _0  No _1  Yes Date of any COVID positive Test in last 90 days:  none  PCP - Adam Phenix, DO Clearance on chart and in 12-27-21 Epic note Cardiologist - Dr. Loralie Champagne   Chest x-ray - 12-28-2021  Epic EKG -12-27-2021  Epic   Stress Test - 11-05-2011  Epic ECHO - 11-03-2011  Epic Cardiac Cath - 11-12-2011  [facility] by Dr. Aundra Dubin   Epic  PCR screen: _2  Ordered & Completed                      _3   No Order but Needs PROFEND                      _4   N/A for this surgery  Surgery Plan:  _5  Ambulatory                            _6  Outpatient in bed                            _7  Admit  Anesthesia:    _8  General  _9  Spinal                           _10   Choice _11   MAC  Pacemaker / ICD device _12  No _13  Yes        Device order form faxed _14  No    _15   Yes      Faxed to:  Spinal Cord Stimulator:_16  No _17  Yes      (Remind patient to bring remote DOS) Other Implants:   History of Sleep Apnea? 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 _25  No _26  Yes  _27  N/A  Blood Thinner / Instructions: None Aspirin Instructions:  ASA 81 mg  Hold 5-7 days. Patient is aware.   ERAS Protocol Ordered: _28  No  _29  Yes PRE-SURGERY _30  ENSURE  _31  G2  Patient is to be NPO after: 07:30 am  Comments: Patient is allergic to CONTRAST DYE, but is NOT allergic to shellfish.   Activity level: Patient can climb a flight of stairs without difficulty; _32  No CP  _33  No SOB, but would have Leg pain. Patient can  perform ADLs without assistance.   Anesthesia review: HTN, anxiety, panic attacks, anemia, fibromyalgia, Palps, asthma, CKD3a.  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   Patient denies shortness of breath, fever, cough and chest pain at PAT appointment.  Patient verbalized understanding and agreement to the Pre-Surgical Instructions that were given to them at this PAT appointment. Patient  was also educated of the need to review these PAT instructions again prior to his/her surgery.I reviewed the appropriate phone numbers to call if they have any and questions or concerns.

## 2022-03-21 NOTE — Patient Instructions (Addendum)
SURGICAL WAITING ROOM VISITATION Patients having surgery or a procedure may have no more than 2 support people in the waiting area - these visitors may rotate in the visitor waiting room.   Due to an increase in RSV and influenza rates and associated hospitalizations, children ages 19 and under may not visit patients in [place]. If the patient needs to stay at the hospital during part of their recovery, the visitor guidelines for inpatient rooms apply.  PRE-OP VISITATION  Pre-op nurse will coordinate an appropriate time for 1 support person to accompany the patient in pre-op.  This support person may not rotate.  This visitor will be contacted when the time is appropriate for the visitor to come back in the pre-op area.  Please refer to the [facility] website for the visitor guidelines for Inpatients (after your surgery is over and you are in a regular room).  You are not required to quarantine at this time prior to your surgery. However, you must do this: Hand Hygiene often Do NOT share personal items Notify your provider if you are in close contact with someone who has COVID or you develop fever 100.4 or greater, new onset of sneezing, cough, sore throat, shortness of breath or body aches.  If you test positive for Covid or have been in contact with anyone that has tested positive in the last 10 days please notify you surgeon.    Your procedure is scheduled on:  Wednesday  April 04, 2022  Report to [facility] Main Entrance: [address] entrance where the [employer] is available.   Report to admitting at: 08:00  AM  +++++Call this number if you have any questions or problems the morning of surgery [phone]  Do not eat food after Midnight the night prior to your surgery/procedure.  After Midnight you may have the following liquids until    07:30 AM DAY OF SURGERY  Clear Liquid Diet Water Black Coffee (sugar ok, NO MILK/CREAM OR CREAMERS)  Tea (sugar ok,  NO MILK/CREAM OR CREAMERS) regular and decaf                             Plain Jell-O  with no fruit (NO RED)                                           Fruit ices (not with fruit pulp, NO RED)                                     Popsicles (NO RED)                                                                  Juice: apple, WHITE grape, WHITE cranberry Sports drinks like Gatorade or Powerade (NO RED)                   The day of surgery:  Drink ONE (1) Pre-Surgery Clear Ensure at 07:30 AM the morning of surgery. Drink in one sitting. Do not  sip.  This drink was given to you during your hospital pre-op appointment visit. Nothing else to drink after completing the Pre-Surgery Clear Ensure : No candy, chewing gum or throat lozenges.    FOLLOW ANY ADDITIONAL PRE OP INSTRUCTIONS YOU RECEIVED FROM YOUR SURGEON'S OFFICE!!!   Oral Hygiene is also important to reduce your risk of infection.        Remember - BRUSH YOUR TEETH THE MORNING OF SURGERY WITH YOUR REGULAR TOOTHPASTE  Do NOT smoke after Midnight the night before surgery.  Take ONLY these medicines the morning of surgery with A SIP OF WATER: Citalopram (Celexa), Lebothyroxine (Synthroid). You may use your Systane Eye drops, Trelegy Ellipta and Albuterol Inhaler. You may take clonazepam (Klonopin) is needed for anxiety.                 You may not have any metal on your body including hair pins, jewelry, and body piercing  Do not wear make-up, lotions, powders, perfumes or deodorant  Do not wear nail polish including gel and S&S, artificial / acrylic nails, or any other type of covering on natural nails including finger and toenails. If you have artificial nails, gel coating, etc., that needs to be removed by a nail salon, Please have this removed prior to surgery. Not doing so may mean that your surgery could be cancelled or delayed if the Surgeon or anesthesia staff feels like they are unable to monitor you safely.   Do not shave 48  hours prior to surgery to avoid nicks in your skin which may contribute to postoperative infections.   Contacts, Hearing Aids, dentures or bridgework may not be worn into surgery.   Patients discharged on the day of surgery will not be allowed to drive home.  Someone NEEDS to stay with you for the first 24 hours after anesthesia.  Do not bring your home medications to the hospital. The Pharmacy will dispense medications listed on your medication list to you during your admission in the Hospital.  Special Instructions: Bring a copy of your healthcare power of attorney and living will documents the day of surgery, if you wish to have them scanned into your [facility] Records- EPIC  Please read over the following fact sheets you were given: IF YOU HAVE QUESTIONS ABOUT YOUR PRE-OP INSTRUCTIONS, PLEASE CALL [phone]  ([place])   [facility] - Preparing for Surgery Before surgery, you can play an important role.  Because skin is not sterile, your skin needs to be as free of germs as possible.  You can reduce the number of germs on your skin by washing with CHG (chlorahexidine gluconate) soap before surgery.  CHG is an antiseptic cleaner which kills germs and bonds with the skin to continue killing germs even after washing. Please DO NOT use if you have an allergy to CHG or antibacterial soaps.  If your skin becomes reddened/irritated stop using the CHG and inform your nurse when you arrive at Short Stay. Do not shave (including legs and underarms) for at least 48 hours prior to the first CHG shower.  You may shave your face/neck.  Please follow these instructions carefully:  1.  Shower with CHG Soap the night before surgery and the  morning of surgery.  2.  If you choose to wash your hair, wash your hair first as usual with your normal  shampoo.  3.  After you shampoo, rinse your hair and body thoroughly to remove the shampoo.  4.  Use CHG as you would any other  liquid soap.  You can apply chg directly to the skin and wash.  Gently with a scrungie or clean washcloth.  5.  Apply the CHG Soap to your body ONLY FROM THE NECK DOWN.   Do not use on face/ open                           Wound or open sores. Avoid contact with eyes, ears mouth and genitals (private parts).                       Wash face,  Genitals (private parts) with your normal soap.             6.  Wash thoroughly, paying special attention to the area where your  surgery  will be performed.  7.  Thoroughly rinse your body with warm water from the neck down.  8.  DO NOT shower/wash with your normal soap after using and rinsing off the CHG Soap.            9.  Pat yourself dry with a clean towel.            10.  Wear clean pajamas.            11.  Place clean sheets on your bed the night of your first shower and do not  sleep with pets.  ON THE DAY OF SURGERY : Do not apply any lotions/deodorants the morning of surgery.  Please wear clean clothes to the hospital/surgery center.    FAILURE TO FOLLOW THESE INSTRUCTIONS MAY RESULT IN THE CANCELLATION OF YOUR SURGERY  PATIENT SIGNATURE_________________________________  NURSE SIGNATURE__________________________________  ________________________________________________________________________        Adam Phenix    An incentive spirometer is a tool that can help keep your lungs clear and active. This tool measures how well you are filling your lungs with each breath. Taking long deep breaths may help reverse or decrease the chance of developing breathing (pulmonary) problems (especially infection) following: A long period of time when you are unable to move or be active. BEFORE THE PROCEDURE  If the spirometer includes an indicator to show your best effort, your nurse or respiratory therapist will set it to a desired goal. If possible, sit up straight or lean slightly forward. Try not to slouch. Hold the incentive  spirometer in an upright position. INSTRUCTIONS FOR USE  Sit on the edge of your bed if possible, or sit up as far as you can in bed or on a chair. Hold the incentive spirometer in an upright position. Breathe out normally. Place the mouthpiece in your mouth and seal your lips tightly around it. Breathe in slowly and as deeply as possible, raising the piston or the ball toward the top of the column. Hold your breath for 3-5 seconds or for as long as possible. Allow the piston or ball to fall to the bottom of the column. Remove the mouthpiece from your mouth and breathe out normally. Rest for a few seconds and repeat Steps 1 through 7 at least 10 times every 1-2 hours when you are awake. Take your time and take a few normal breaths between deep breaths. The spirometer may include an indicator to show your best effort. Use the indicator as a goal to work toward during each repetition. After each set of 10 deep breaths, practice coughing to  be sure your lungs are clear. If you have an incision (the cut made at the time of surgery), support your incision when coughing by placing a pillow or rolled up towels firmly against it. Once you are able to get out of bed, walk around indoors and cough well. You may stop using the incentive spirometer when instructed by your caregiver.  RISKS AND COMPLICATIONS Take your time so you do not get dizzy or light-headed. If you are in pain, you may need to take or ask for pain medication before doing incentive spirometry. It is harder to take a deep breath if you are having pain. AFTER USE Rest and breathe slowly and easily. It can be helpful to keep track of a log of your progress. Your caregiver can provide you with a simple table to help with this. If you are using the spirometer at home, follow these instructions: [place] IF:  You are having difficultly using the spirometer. You have trouble using the spirometer as often as instructed. Your pain  medication is not giving enough relief while using the spirometer. You develop fever of 100.5 F (38.1 C) or higher.                                                                                                    SEEK IMMEDIATE MEDICAL CARE IF:  You cough up bloody sputum that had not been present before. You develop fever of 102 F (38.9 C) or greater. You develop worsening pain at or near the incision site. MAKE SURE YOU:  Understand these instructions. Will watch your condition. Will get help right away if you are not doing well or get worse. Document Released: 07/02/2006 Document Revised: 05/14/2011 Document Reviewed: 09/02/2006 [facility] Patient Information 2014 [place], [place].

## 2022-03-22 ENCOUNTER — Encounter (HOSPITAL_COMMUNITY): Payer: Self-pay

## 2022-03-22 ENCOUNTER — Encounter (HOSPITAL_COMMUNITY)
Admission: RE | Admit: 2022-03-22 | Discharge: 2022-03-22 | Disposition: A | Discharge status: Home / Self Care | Payer: Medicare PPO | Source: Ambulatory Visit | Attending: Orthopedic Surgery | Admitting: Orthopedic Surgery

## 2022-03-22 ENCOUNTER — Other Ambulatory Visit: Payer: Self-pay

## 2022-03-22 VITALS — BP 138/78 | HR 68 | Temp 97.7°F | Resp 16 | Ht 64.0 in | Wt 160.0 lb

## 2022-03-22 DIAGNOSIS — I10 Essential (primary) hypertension: Secondary | ICD-10-CM | POA: Diagnosis not present

## 2022-03-22 DIAGNOSIS — J3089 Other allergic rhinitis: Secondary | ICD-10-CM | POA: Diagnosis not present

## 2022-03-22 DIAGNOSIS — Z01818 Encounter for other preprocedural examination: Secondary | ICD-10-CM

## 2022-03-22 DIAGNOSIS — Z01812 Encounter for preprocedural laboratory examination: Secondary | ICD-10-CM | POA: Diagnosis not present

## 2022-03-22 DIAGNOSIS — J3081 Allergic rhinitis due to animal (cat) (dog) hair and dander: Secondary | ICD-10-CM | POA: Diagnosis not present

## 2022-03-22 DIAGNOSIS — J301 Allergic rhinitis due to pollen: Secondary | ICD-10-CM | POA: Diagnosis not present

## 2022-03-22 HISTORY — DX: Unspecified osteoarthritis, unspecified site: M19.90

## 2022-03-22 HISTORY — DX: Other specified postprocedural states: Z98.890

## 2022-03-22 HISTORY — DX: Chronic kidney disease, unspecified: N18.9

## 2022-03-22 HISTORY — DX: Nausea with vomiting, unspecified: R11.2

## 2022-03-22 LAB — CBC
HCT: 40.6 % (ref 36.0–46.0)
Hemoglobin: 13.7 g/dL (ref 12.0–15.0)
MCH: 28.6 pg (ref 26.0–34.0)
MCHC: 33.7 g/dL (ref 30.0–36.0)
MCV: 84.8 fL (ref 80.0–100.0)
Platelets: 305 10*3/uL (ref 150–400)
RBC: 4.79 MIL/uL (ref 3.87–5.11)
RDW: 12.7 % (ref 11.5–15.5)
WBC: 6.5 10*3/uL (ref 4.0–10.5)
nRBC: 0 % (ref 0.0–0.2)

## 2022-03-22 LAB — BASIC METABOLIC PANEL
Anion gap: 9 (ref 5–15)
BUN: 16 mg/dL (ref 8–23)
CO2: 24 mmol/L (ref 22–32)
Calcium: 8.9 mg/dL (ref 8.9–10.3)
Chloride: 101 mmol/L (ref 98–111)
Creatinine, Ser: 0.92 mg/dL (ref 0.44–1.00)
GFR, Estimated: >60 mL/min (ref >60)
Glucose, Bld: 91 mg/dL (ref 70–99)
Potassium: 3.7 mmol/L (ref 3.5–5.1)
Sodium: 134 mmol/L — ABNORMAL LOW (ref 135–145)

## 2022-03-22 LAB — SURGICAL PCR SCREEN
MRSA, PCR: NEGATIVE
Staphylococcus aureus: NEGATIVE

## 2022-03-25 ENCOUNTER — Other Ambulatory Visit: Payer: Self-pay | Admitting: Family Medicine

## 2022-03-25 DIAGNOSIS — E7800 Pure hypercholesterolemia, unspecified: Secondary | ICD-10-CM

## 2022-03-27 ENCOUNTER — Ambulatory Visit: Payer: Self-pay | Admitting: Student

## 2022-03-27 DIAGNOSIS — J3089 Other allergic rhinitis: Secondary | ICD-10-CM | POA: Diagnosis not present

## 2022-03-27 DIAGNOSIS — J301 Allergic rhinitis due to pollen: Secondary | ICD-10-CM | POA: Diagnosis not present

## 2022-03-27 DIAGNOSIS — J3081 Allergic rhinitis due to animal (cat) (dog) hair and dander: Secondary | ICD-10-CM | POA: Diagnosis not present
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
## 2022-04-02 DIAGNOSIS — J301 Allergic rhinitis due to pollen: Secondary | ICD-10-CM | POA: Diagnosis not present

## 2022-04-02 DIAGNOSIS — J3081 Allergic rhinitis due to animal (cat) (dog) hair and dander: Secondary | ICD-10-CM | POA: Diagnosis not present

## 2022-04-02 DIAGNOSIS — J3089 Other allergic rhinitis: Secondary | ICD-10-CM | POA: Diagnosis not present

## 2022-04-04 ENCOUNTER — Observation Stay (HOSPITAL_COMMUNITY)
Admission: RE | Admit: 2022-04-04 | Discharge: 2022-04-05 | Disposition: A | Discharge status: Home / Self Care | Payer: Medicare PPO | Attending: Orthopedic Surgery | Admitting: Orthopedic Surgery

## 2022-04-04 ENCOUNTER — Other Ambulatory Visit: Payer: Self-pay

## 2022-04-04 ENCOUNTER — Ambulatory Visit (HOSPITAL_BASED_OUTPATIENT_CLINIC_OR_DEPARTMENT_OTHER): Payer: Medicare PPO | Admitting: Anesthesiology

## 2022-04-04 ENCOUNTER — Encounter (HOSPITAL_COMMUNITY)
Admission: RE | Disposition: A | Discharge status: Home / Self Care | Payer: Self-pay | Source: Home / Self Care | Attending: Orthopedic Surgery

## 2022-04-04 ENCOUNTER — Ambulatory Visit (HOSPITAL_COMMUNITY): Payer: Medicare PPO | Admitting: Physician Assistant

## 2022-04-04 ENCOUNTER — Ambulatory Visit (HOSPITAL_COMMUNITY): Payer: Medicare PPO

## 2022-04-04 ENCOUNTER — Encounter (HOSPITAL_COMMUNITY): Payer: Self-pay | Admitting: Orthopedic Surgery

## 2022-04-04 ENCOUNTER — Other Ambulatory Visit (HOSPITAL_COMMUNITY): Payer: Self-pay

## 2022-04-04 DIAGNOSIS — Z96652 Presence of left artificial knee joint: Secondary | ICD-10-CM | POA: Diagnosis not present

## 2022-04-04 DIAGNOSIS — J45909 Unspecified asthma, uncomplicated: Secondary | ICD-10-CM

## 2022-04-04 DIAGNOSIS — M1712 Unilateral primary osteoarthritis, left knee: Principal | ICD-10-CM | POA: Diagnosis present

## 2022-04-04 DIAGNOSIS — Z7982 Long term (current) use of aspirin: Secondary | ICD-10-CM | POA: Insufficient documentation

## 2022-04-04 DIAGNOSIS — N189 Chronic kidney disease, unspecified: Secondary | ICD-10-CM | POA: Insufficient documentation

## 2022-04-04 DIAGNOSIS — Z79899 Other long term (current) drug therapy: Secondary | ICD-10-CM | POA: Diagnosis not present

## 2022-04-04 DIAGNOSIS — G8918 Other acute postprocedural pain: Secondary | ICD-10-CM | POA: Diagnosis not present

## 2022-04-04 DIAGNOSIS — I129 Hypertensive chronic kidney disease with stage 1 through stage 4 chronic kidney disease, or unspecified chronic kidney disease: Secondary | ICD-10-CM | POA: Insufficient documentation

## 2022-04-04 DIAGNOSIS — E039 Hypothyroidism, unspecified: Secondary | ICD-10-CM

## 2022-04-04 DIAGNOSIS — M25462 Effusion, left knee: Secondary | ICD-10-CM | POA: Diagnosis not present

## 2022-04-04 DIAGNOSIS — I10 Essential (primary) hypertension: Secondary | ICD-10-CM | POA: Diagnosis not present

## 2022-04-04 DIAGNOSIS — Z471 Aftercare following joint replacement surgery: Secondary | ICD-10-CM | POA: Diagnosis not present

## 2022-04-04 HISTORY — PX: KNEE ARTHROPLASTY: SHX992

## 2022-04-04 SURGERY — ARTHROPLASTY, KNEE, TOTAL, USING IMAGELESS COMPUTER-ASSISTED NAVIGATION
Anesthesia: Spinal | Site: Knee | Laterality: Left

## 2022-04-04 MED ORDER — CEFAZOLIN SODIUM-DEXTROSE 2-4 GM/100ML-% IV SOLN: 2.0000 g | Freq: Four times a day (QID) | INTRAVENOUS | Status: DC

## 2022-04-04 MED ORDER — LACTATED RINGERS IV SOLN: INTRAVENOUS | Status: DC

## 2022-04-04 MED ORDER — ACETAMINOPHEN 500 MG PO TABS
1000.0000 mg | ORAL_TABLET | Freq: Four times a day (QID) | ORAL | Status: AC
  Administered 2022-04-04 – 2022-04-05 (×4): 1000 mg via ORAL
  Filled 2022-04-04 (×4): qty 2

## 2022-04-04 MED ORDER — ONDANSETRON HCL 4 MG PO TABS: 4.0000 mg | ORAL_TABLET | Freq: Four times a day (QID) | ORAL | Status: DC | PRN

## 2022-04-04 MED ORDER — ALUM & MAG HYDROXIDE-SIMETH 200-200-20 MG/5ML PO SUSP: 30.0000 mL | ORAL | Status: DC | PRN

## 2022-04-04 MED ORDER — ONDANSETRON HCL 4 MG/2ML IJ SOLN
INTRAMUSCULAR | Status: AC
  Filled 2022-04-04: qty 2

## 2022-04-04 MED ORDER — LACTATED RINGERS IV BOLUS
500.0000 mL | Freq: Once | INTRAVENOUS | Status: AC
  Administered 2022-04-04: 500 mL via INTRAVENOUS

## 2022-04-04 MED ORDER — BUPIVACAINE-EPINEPHRINE 0.25% -1:200000 IJ SOLN
INTRAMUSCULAR | Status: DC | PRN
  Administered 2022-04-04: 30 mL

## 2022-04-04 MED ORDER — STERILE WATER FOR IRRIGATION IR SOLN
Status: DC | PRN
  Administered 2022-04-04: 2000 mL

## 2022-04-04 MED ORDER — ONDANSETRON HCL 4 MG/2ML IJ SOLN
INTRAMUSCULAR | Status: DC | PRN
  Administered 2022-04-04: 4 mg via INTRAVENOUS

## 2022-04-04 MED ORDER — SODIUM CHLORIDE (PF) 0.9 % IJ SOLN
INTRAMUSCULAR | Status: AC
  Filled 2022-04-04: qty 30

## 2022-04-04 MED ORDER — ASPIRIN 81 MG PO CHEW: 81.0000 mg | CHEWABLE_TABLET | Freq: Two times a day (BID) | ORAL | Status: DC

## 2022-04-04 MED ORDER — MENTHOL 3 MG MT LOZG: 1.0000 | LOZENGE | OROMUCOSAL | Status: DC | PRN

## 2022-04-04 MED ORDER — DOCUSATE SODIUM 100 MG PO CAPS: 100.0000 mg | ORAL_CAPSULE | Freq: Two times a day (BID) | ORAL | Status: DC

## 2022-04-04 MED ORDER — PROPOFOL 500 MG/50ML IV EMUL
INTRAVENOUS | Status: DC | PRN
  Administered 2022-04-04: 100 ug/kg/min via INTRAVENOUS

## 2022-04-04 MED ORDER — SENNA 8.6 MG PO TABS
1.0000 | ORAL_TABLET | Freq: Two times a day (BID) | ORAL | Status: DC
  Administered 2022-04-04 – 2022-04-05 (×2): 8.6 mg via ORAL
  Filled 2022-04-04 (×2): qty 1

## 2022-04-04 MED ORDER — METHOCARBAMOL 500 MG PO TABS
500.0000 mg | ORAL_TABLET | Freq: Four times a day (QID) | ORAL | Status: DC | PRN
  Administered 2022-04-04 – 2022-04-05 (×2): 500 mg via ORAL
  Filled 2022-04-04 (×2): qty 1

## 2022-04-04 MED ORDER — OXYCODONE HCL 5 MG PO TABS: 5.0000 mg | ORAL_TABLET | ORAL | Status: DC | PRN

## 2022-04-04 MED ORDER — MIDAZOLAM HCL 2 MG/2ML IJ SOLN
1.0000 mg | INTRAMUSCULAR | Status: DC
  Filled 2022-04-04: qty 2

## 2022-04-04 MED ORDER — PROPOFOL 500 MG/50ML IV EMUL
INTRAVENOUS | Status: AC
  Filled 2022-04-04: qty 50

## 2022-04-04 MED ORDER — METOCLOPRAMIDE HCL 5 MG PO TABS: 5.0000 mg | ORAL_TABLET | Freq: Three times a day (TID) | ORAL | Status: DC | PRN

## 2022-04-04 MED ORDER — ORAL CARE MOUTH RINSE: 15.0000 mL | Freq: Once | OROMUCOSAL | Status: AC

## 2022-04-04 MED ORDER — ASPIRIN 81 MG PO CHEW
81.0000 mg | CHEWABLE_TABLET | Freq: Two times a day (BID) | ORAL | 0 refills | Status: AC
  Filled 2022-04-04: qty 90, 45d supply, fill #0

## 2022-04-04 MED ORDER — LACTATED RINGERS IV BOLUS
250.0000 mL | Freq: Once | INTRAVENOUS | Status: AC
  Administered 2022-04-04: 250 mL via INTRAVENOUS

## 2022-04-04 MED ORDER — BUPIVACAINE HCL (PF) 0.5 % IJ SOLN
INTRAMUSCULAR | Status: AC
  Filled 2022-04-04: qty 30

## 2022-04-04 MED ORDER — METHOCARBAMOL 500 MG IVPB - SIMPLE MED
INTRAVENOUS | Status: AC
  Filled 2022-04-04: qty 55

## 2022-04-04 MED ORDER — PROPOFOL 10 MG/ML IV BOLUS
INTRAVENOUS | Status: DC | PRN
  Administered 2022-04-04 (×2): 30 mg via INTRAVENOUS

## 2022-04-04 MED ORDER — KETOROLAC TROMETHAMINE 30 MG/ML IJ SOLN
INTRAMUSCULAR | Status: DC | PRN
  Administered 2022-04-04: 30 mg

## 2022-04-04 MED ORDER — PHENYLEPHRINE 80 MCG/ML (10ML) SYRINGE FOR IV PUSH (FOR BLOOD PRESSURE SUPPORT)
PREFILLED_SYRINGE | INTRAVENOUS | Status: AC
  Filled 2022-04-04: qty 10

## 2022-04-04 MED ORDER — OXYCODONE HCL 5 MG PO TABS
5.0000 mg | ORAL_TABLET | ORAL | Status: DC | PRN
  Administered 2022-04-04 (×4): 5 mg via ORAL
  Administered 2022-04-05 (×2): 10 mg via ORAL
  Filled 2022-04-04: qty 1
  Filled 2022-04-04 (×2): qty 2
  Filled 2022-04-04 (×3): qty 1

## 2022-04-04 MED ORDER — ASPIRIN 81 MG PO CHEW
81.0000 mg | CHEWABLE_TABLET | Freq: Two times a day (BID) | ORAL | Status: DC
  Administered 2022-04-04 – 2022-04-05 (×2): 81 mg via ORAL
  Filled 2022-04-04 (×2): qty 1

## 2022-04-04 MED ORDER — ACETAMINOPHEN 10 MG/ML IV SOLN: 1000.0000 mg | Freq: Once | INTRAVENOUS | Status: DC | PRN

## 2022-04-04 MED ORDER — DEXAMETHASONE SODIUM PHOSPHATE 10 MG/ML IJ SOLN
INTRAMUSCULAR | Status: AC
  Filled 2022-04-04: qty 1

## 2022-04-04 MED ORDER — HYDROMORPHONE HCL 1 MG/ML IJ SOLN: 0.2500 mg | INTRAMUSCULAR | Status: DC | PRN

## 2022-04-04 MED ORDER — DEXAMETHASONE SODIUM PHOSPHATE 10 MG/ML IJ SOLN
INTRAMUSCULAR | Status: DC | PRN
  Administered 2022-04-04: 5 mg via INTRAVENOUS

## 2022-04-04 MED ORDER — DOCUSATE SODIUM 100 MG PO CAPS
100.0000 mg | ORAL_CAPSULE | Freq: Two times a day (BID) | ORAL | 1 refills | Status: AC
  Filled 2022-04-04: qty 100, 50d supply, fill #0

## 2022-04-04 MED ORDER — FENTANYL CITRATE PF 50 MCG/ML IJ SOSY
50.0000 ug | PREFILLED_SYRINGE | INTRAMUSCULAR | Status: DC
  Administered 2022-04-04: 50 ug via INTRAVENOUS
  Filled 2022-04-04: qty 2

## 2022-04-04 MED ORDER — POVIDONE-IODINE 10 % EX SWAB: 2.0000 | Freq: Once | CUTANEOUS | Status: DC

## 2022-04-04 MED ORDER — BUPIVACAINE HCL 0.25 % IJ SOLN
INTRAMUSCULAR | Status: AC
  Filled 2022-04-04: qty 1

## 2022-04-04 MED ORDER — SODIUM CHLORIDE 0.9 % IV SOLN: INTRAVENOUS | Status: DC

## 2022-04-04 MED ORDER — KETOROLAC TROMETHAMINE 30 MG/ML IJ SOLN
INTRAMUSCULAR | Status: AC
  Filled 2022-04-04: qty 1

## 2022-04-04 MED ORDER — ACETAMINOPHEN 160 MG/5ML PO SOLN: 325.0000 mg | Freq: Once | ORAL | Status: DC | PRN

## 2022-04-04 MED ORDER — SODIUM CHLORIDE 0.9 % IR SOLN
Status: DC | PRN
  Administered 2022-04-04 (×2): 1000 mL
  Administered 2022-04-04: 3000 mL

## 2022-04-04 MED ORDER — ACETAMINOPHEN 325 MG PO TABS: 325.0000 mg | ORAL_TABLET | Freq: Four times a day (QID) | ORAL | Status: DC | PRN

## 2022-04-04 MED ORDER — TRANEXAMIC ACID-NACL 1000-0.7 MG/100ML-% IV SOLN
1000.0000 mg | Freq: Once | INTRAVENOUS | Status: AC
  Administered 2022-04-04: 1000 mg via INTRAVENOUS
  Filled 2022-04-04: qty 100

## 2022-04-04 MED ORDER — DIPHENHYDRAMINE HCL 12.5 MG/5ML PO ELIX: 12.5000 mg | ORAL_SOLUTION | ORAL | Status: DC | PRN

## 2022-04-04 MED ORDER — METHOCARBAMOL 500 MG PO TABS: 500.0000 mg | ORAL_TABLET | Freq: Four times a day (QID) | ORAL | Status: DC | PRN

## 2022-04-04 MED ORDER — OXYCODONE HCL 5 MG PO TABS
5.0000 mg | ORAL_TABLET | ORAL | 0 refills | Status: AC | PRN
  Filled 2022-04-04: qty 42, 7d supply, fill #0

## 2022-04-04 MED ORDER — POLYETHYLENE GLYCOL 3350 17 G PO PACK: 17.0000 g | PACK | Freq: Every day | ORAL | Status: DC | PRN

## 2022-04-04 MED ORDER — METHOCARBAMOL 500 MG IVPB - SIMPLE MED
500.0000 mg | Freq: Four times a day (QID) | INTRAVENOUS | Status: DC | PRN
  Administered 2022-04-04: 500 mg via INTRAVENOUS

## 2022-04-04 MED ORDER — ACETAMINOPHEN 325 MG PO TABS: 325.0000 mg | ORAL_TABLET | Freq: Once | ORAL | Status: DC | PRN

## 2022-04-04 MED ORDER — OXYCODONE HCL 5 MG PO TABS: 10.0000 mg | ORAL_TABLET | ORAL | Status: DC | PRN

## 2022-04-04 MED ORDER — ACETAMINOPHEN 500 MG PO TABS: 1000.0000 mg | ORAL_TABLET | Freq: Four times a day (QID) | ORAL | Status: DC

## 2022-04-04 MED ORDER — OXYCODONE HCL 5 MG PO TABS
10.0000 mg | ORAL_TABLET | ORAL | Status: DC | PRN
  Administered 2022-04-05: 5 mg via ORAL

## 2022-04-04 MED ORDER — DOCUSATE SODIUM 100 MG PO CAPS
100.0000 mg | ORAL_CAPSULE | Freq: Two times a day (BID) | ORAL | Status: DC
  Administered 2022-04-04 – 2022-04-05 (×2): 100 mg via ORAL
  Filled 2022-04-04 (×2): qty 1

## 2022-04-04 MED ORDER — POLYETHYLENE GLYCOL 3350 17 G PO PACK
17.0000 g | PACK | Freq: Every day | ORAL | 0 refills | Status: DC | PRN
  Filled 2022-04-04: qty 14, 14d supply, fill #0

## 2022-04-04 MED ORDER — MUPIROCIN 2 % EX OINT
1.0000 | TOPICAL_OINTMENT | Freq: Once | CUTANEOUS | Status: AC
  Administered 2022-04-04: 1 via TOPICAL
  Filled 2022-04-04: qty 22

## 2022-04-04 MED ORDER — ONDANSETRON HCL 4 MG/2ML IJ SOLN: 4.0000 mg | Freq: Four times a day (QID) | INTRAMUSCULAR | Status: DC | PRN

## 2022-04-04 MED ORDER — PHENOL 1.4 % MT LIQD: 1.0000 | OROMUCOSAL | Status: DC | PRN

## 2022-04-04 MED ORDER — ACETAMINOPHEN 500 MG PO TABS
1000.0000 mg | ORAL_TABLET | Freq: Three times a day (TID) | ORAL | 0 refills | Status: AC
  Filled 2022-04-04: qty 180, 30d supply, fill #0

## 2022-04-04 MED ORDER — CEFAZOLIN SODIUM-DEXTROSE 2-4 GM/100ML-% IV SOLN
2.0000 g | Freq: Four times a day (QID) | INTRAVENOUS | Status: AC
  Administered 2022-04-04 (×2): 2 g via INTRAVENOUS
  Filled 2022-04-04 (×2): qty 100

## 2022-04-04 MED ORDER — CHLORHEXIDINE GLUCONATE 0.12 % MT SOLN
15.0000 mL | Freq: Once | OROMUCOSAL | Status: AC
  Administered 2022-04-04: 15 mL via OROMUCOSAL

## 2022-04-04 MED ORDER — TRANEXAMIC ACID-NACL 1000-0.7 MG/100ML-% IV SOLN
1000.0000 mg | INTRAVENOUS | Status: AC
  Administered 2022-04-04: 1000 mg via INTRAVENOUS
  Filled 2022-04-04: qty 100

## 2022-04-04 MED ORDER — PHENYLEPHRINE HCL-NACL 20-0.9 MG/250ML-% IV SOLN
INTRAVENOUS | Status: AC
  Filled 2022-04-04: qty 250

## 2022-04-04 MED ORDER — AMISULPRIDE (ANTIEMETIC) 5 MG/2ML IV SOLN: 10.0000 mg | Freq: Once | INTRAVENOUS | Status: DC | PRN

## 2022-04-04 MED ORDER — PANTOPRAZOLE SODIUM 40 MG PO TBEC
40.0000 mg | DELAYED_RELEASE_TABLET | Freq: Every day | ORAL | Status: DC
  Administered 2022-04-04 – 2022-04-05 (×2): 40 mg via ORAL
  Filled 2022-04-04 (×2): qty 1

## 2022-04-04 MED ORDER — PHENYLEPHRINE HCL-NACL 20-0.9 MG/250ML-% IV SOLN
INTRAVENOUS | Status: DC | PRN
  Administered 2022-04-04: 15 ug/min via INTRAVENOUS

## 2022-04-04 MED ORDER — SODIUM CHLORIDE (PF) 0.9 % IJ SOLN
INTRAMUSCULAR | Status: DC | PRN
  Administered 2022-04-04: 30 mL via INTRAVENOUS

## 2022-04-04 MED ORDER — METHOCARBAMOL 1000 MG/10ML IJ SOLN: 500.0000 mg | Freq: Four times a day (QID) | INTRAVENOUS | Status: DC | PRN

## 2022-04-04 MED ORDER — BISACODYL 10 MG RE SUPP: 10.0000 mg | Freq: Every day | RECTAL | Status: DC | PRN

## 2022-04-04 MED ORDER — ISOPROPYL ALCOHOL 70 % SOLN
Status: DC | PRN
  Administered 2022-04-04: 1 via TOPICAL

## 2022-04-04 MED ORDER — SENNA 8.6 MG PO TABS
2.0000 | ORAL_TABLET | Freq: Every day | ORAL | 1 refills | Status: AC
  Filled 2022-04-04: qty 60, 30d supply, fill #0

## 2022-04-04 MED ORDER — OXYCODONE HCL 5 MG PO TABS
ORAL_TABLET | ORAL | Status: AC
  Filled 2022-04-04: qty 1

## 2022-04-04 MED ORDER — ONDANSETRON HCL 4 MG/2ML IJ SOLN
4.0000 mg | Freq: Four times a day (QID) | INTRAMUSCULAR | Status: DC | PRN
  Administered 2022-04-04 – 2022-04-05 (×3): 4 mg via INTRAVENOUS
  Filled 2022-04-04 (×2): qty 2

## 2022-04-04 MED ORDER — METOCLOPRAMIDE HCL 5 MG/ML IJ SOLN: 5.0000 mg | Freq: Three times a day (TID) | INTRAMUSCULAR | Status: DC | PRN

## 2022-04-04 MED ORDER — CEFAZOLIN SODIUM-DEXTROSE 2-4 GM/100ML-% IV SOLN
2.0000 g | INTRAVENOUS | Status: AC
  Administered 2022-04-04: 2 g via INTRAVENOUS
  Filled 2022-04-04: qty 100

## 2022-04-04 MED ORDER — HYDROMORPHONE HCL 1 MG/ML IJ SOLN: 0.5000 mg | INTRAMUSCULAR | Status: DC | PRN

## 2022-04-04 MED ORDER — SENNA 8.6 MG PO TABS: 1.0000 | ORAL_TABLET | Freq: Two times a day (BID) | ORAL | Status: DC

## 2022-04-04 MED ORDER — ACETAMINOPHEN 500 MG PO TABS
1000.0000 mg | ORAL_TABLET | Freq: Once | ORAL | Status: AC
  Administered 2022-04-04: 1000 mg via ORAL
  Filled 2022-04-04: qty 2

## 2022-04-04 MED ORDER — PROMETHAZINE HCL 25 MG/ML IJ SOLN: 6.2500 mg | INTRAMUSCULAR | Status: DC | PRN

## 2022-04-04 MED ORDER — BUPIVACAINE IN DEXTROSE 0.75-8.25 % IT SOLN
INTRATHECAL | Status: DC | PRN
  Administered 2022-04-04: 2 mL via INTRATHECAL

## 2022-04-04 MED ORDER — ONDANSETRON HCL 4 MG PO TABS
4.0000 mg | ORAL_TABLET | Freq: Three times a day (TID) | ORAL | 0 refills | Status: DC | PRN
  Filled 2022-04-04: qty 20, 7d supply, fill #0

## 2022-04-04 MED ORDER — HYDROMORPHONE HCL 1 MG/ML IJ SOLN
0.5000 mg | INTRAMUSCULAR | Status: DC | PRN
  Administered 2022-04-05: 1 mg via INTRAVENOUS
  Filled 2022-04-04: qty 1

## 2022-04-04 MED ORDER — EPINEPHRINE PF 1 MG/ML IJ SOLN
INTRAMUSCULAR | Status: AC
  Filled 2022-04-04: qty 1

## 2022-04-04 SURGICAL SUPPLY — 72 items
ADH SKN CLS APL DERMABOND .7 (GAUZE/BANDAGES/DRESSINGS) ×2
APL PRP STRL LF DISP 70% ISPRP (MISCELLANEOUS) ×2
BAG COUNTER SPONGE SURGICOUNT (BAG) IMPLANT
BAG SPEC THK2 15X12 ZIP CLS (MISCELLANEOUS)
BAG SPNG CNTER NS LX DISP (BAG)
BAG ZIPLOCK 12X15 (MISCELLANEOUS) IMPLANT
BATTERY INSTRU NAVIGATION (MISCELLANEOUS) ×6 IMPLANT
BLADE SAW RECIPROCATING 77.5 (BLADE) ×2 IMPLANT
BNDG CMPR MED 10X6 ELC LF (GAUZE/BANDAGES/DRESSINGS) ×1
BNDG ELASTIC 3X5.8 VLCR STR LF (GAUZE/BANDAGES/DRESSINGS) IMPLANT
BNDG ELASTIC 4X5.8 VLCR STR LF (GAUZE/BANDAGES/DRESSINGS) ×2 IMPLANT
BNDG ELASTIC 6X10 VLCR STRL LF (GAUZE/BANDAGES/DRESSINGS) IMPLANT
BNDG ELASTIC 6X5.8 VLCR STR LF (GAUZE/BANDAGES/DRESSINGS) ×2 IMPLANT
BOWL SMART MIX CTS (DISPOSABLE) IMPLANT
BTRY SRG DRVR LF (MISCELLANEOUS) ×3
CHLORAPREP W/TINT 26 (MISCELLANEOUS) ×4 IMPLANT
COVER SURGICAL LIGHT HANDLE (MISCELLANEOUS) ×2 IMPLANT
DERMABOND ADVANCED .7 DNX12 (GAUZE/BANDAGES/DRESSINGS) ×4 IMPLANT
DRAPE SHEET LG 3/4 BI-LAMINATE (DRAPES) ×6 IMPLANT
DRAPE U-SHAPE 47X51 STRL (DRAPES) ×2 IMPLANT
DRSG AQUACEL AG ADV 3.5X10 (GAUZE/BANDAGES/DRESSINGS) ×2 IMPLANT
ELECT BLADE TIP CTD 4 INCH (ELECTRODE) ×2 IMPLANT
ELECT REM PT RETURN 15FT ADLT (MISCELLANEOUS) ×2 IMPLANT
GAUZE SPONGE 4X4 12PLY STRL (GAUZE/BANDAGES/DRESSINGS) ×2 IMPLANT
GLOVE BIO SURGEON STRL SZ7 (GLOVE) ×2 IMPLANT
GLOVE BIO SURGEON STRL SZ8.5 (GLOVE) ×4 IMPLANT
GLOVE BIOGEL PI IND STRL 7.5 (GLOVE) ×2 IMPLANT
GLOVE BIOGEL PI IND STRL 8.5 (GLOVE) ×2 IMPLANT
GOWN SPEC L3 XXLG W/TWL (GOWN DISPOSABLE) ×2 IMPLANT
GOWN STRL REUS W/ TWL XL LVL3 (GOWN DISPOSABLE) ×2 IMPLANT
GOWN STRL REUS W/TWL XL LVL3 (GOWN DISPOSABLE) ×1
HANDPIECE INTERPULSE COAX TIP (DISPOSABLE) ×1
HDLS TROCR DRIL PIN KNEE 75 (PIN) ×1
HOLDER FOLEY CATH W/STRAP (MISCELLANEOUS) ×2 IMPLANT
HOOD PEEL AWAY T7 (MISCELLANEOUS) ×6 IMPLANT
IMPL FEM CMT CR STD SZ 8 LT (Joint) IMPLANT
IMPL PATELLA METAL SZ32X10 (Joint) IMPLANT
KIT TURNOVER KIT A (KITS) IMPLANT
LINER TIB PS CD/3-9 10 LT (Liner) IMPLANT
MARKER SKIN DUAL TIP RULER LAB (MISCELLANEOUS) ×2 IMPLANT
NDL SAFETY ECLIP 18X1.5 (MISCELLANEOUS) ×2 IMPLANT
NDL SPNL 18GX3.5 QUINCKE PK (NEEDLE) ×2 IMPLANT
NEEDLE SPNL 18GX3.5 QUINCKE PK (NEEDLE) ×1 IMPLANT
NS IRRIG 1000ML POUR BTL (IV SOLUTION) ×2 IMPLANT
PACK TOTAL KNEE CUSTOM (KITS) ×2 IMPLANT
PADDING CAST COTTON 6X4 STRL (CAST SUPPLIES) ×2 IMPLANT
PIN DRILL HDLS TROCAR 75 4PK (PIN) IMPLANT
PROTECTOR NERVE ULNAR (MISCELLANEOUS) ×2 IMPLANT
SAW OSC TIP CART 19.5X105X1.3 (SAW) ×2 IMPLANT
SCREW FEMALE HEX FIX 25X2.5 (ORTHOPEDIC DISPOSABLE SUPPLIES) IMPLANT
SEALER BIPOLAR AQUA 6.0 (INSTRUMENTS) ×2 IMPLANT
SET HNDPC FAN SPRY TIP SCT (DISPOSABLE) ×2 IMPLANT
SET PAD KNEE POSITIONER (MISCELLANEOUS) ×2 IMPLANT
SOLUTION PRONTOSAN WOUND 350ML (IRRIGATION / IRRIGATOR) IMPLANT
SPIKE FLUID TRANSFER (MISCELLANEOUS) ×4 IMPLANT
SPLINT PLASTER CAST FAST 5X45 (CAST SUPPLIES) IMPLANT
STEM TIB PS KNEE D 0D LT (Stem) IMPLANT
SUT MNCRL AB 3-0 PS2 18 (SUTURE) ×2 IMPLANT
SUT MNCRL AB 4-0 PS2 18 (SUTURE) IMPLANT
SUT MON AB 2-0 CT1 36 (SUTURE) ×2 IMPLANT
SUT STRATAFIX PDO 1 14 VIOLET (SUTURE) ×1
SUT STRATFX PDO 1 14 VIOLET (SUTURE) ×1
SUT VIC AB 1 CTX 36 (SUTURE) ×2
SUT VIC AB 1 CTX36XBRD ANBCTR (SUTURE) ×4 IMPLANT
SUT VIC AB 2-0 CT1 27 (SUTURE) ×1
SUT VIC AB 2-0 CT1 TAPERPNT 27 (SUTURE) ×2 IMPLANT
SUTURE STRATFX PDO 1 14 VIOLET (SUTURE) ×2 IMPLANT
SYR 3ML LL SCALE MARK (SYRINGE) ×2 IMPLANT
TRAY FOLEY MTR SLVR 16FR STAT (SET/KITS/TRAYS/PACK) IMPLANT
TUBE SUCTION HIGH CAP CLEAR NV (SUCTIONS) ×2 IMPLANT
WATER STERILE IRR 1000ML POUR (IV SOLUTION) ×4 IMPLANT
WRAP KNEE MAXI GEL POST OP (GAUZE/BANDAGES/DRESSINGS) IMPLANT

## 2022-04-04 NOTE — Anesthesia Preprocedure Evaluation (Addendum)
Anesthesia Evaluation  Patient identified by MRN, date of birth, ID band Patient awake    Reviewed: Allergy & Precautions, NPO status , Patient's Chart, lab work & pertinent test results  Airway Mallampati: II  TM Distance: >3 FB Neck ROM: Full    Dental  (+) Teeth Intact, Dental Advisory Given   Pulmonary asthma    breath sounds clear to auscultation       Cardiovascular hypertension, Pt. on medications  Rhythm:Regular Rate:Normal     Neuro/Psych  Headaches PSYCHIATRIC DISORDERS Anxiety        GI/Hepatic negative GI ROS,,,(+) Hepatitis -  Endo/Other  Hypothyroidism    Renal/GU Renal disease     Musculoskeletal  (+) Arthritis ,  Fibromyalgia -  Abdominal   Peds  Hematology negative hematology ROS (+)   Anesthesia Other Findings   Reproductive/Obstetrics                             Anesthesia Physical Anesthesia Plan  ASA: 3  Anesthesia Plan: Spinal   Post-op Pain Management: Regional block*   Induction: Intravenous  PONV Risk Score and Plan: 3 and Ondansetron, Dexamethasone and Midazolam  Airway Management Planned: Natural Airway and Nasal Cannula  Additional Equipment: None  Intra-op Plan:   Post-operative Plan:   Informed Consent: I have reviewed the patients History and Physical, chart, labs and discussed the procedure including the risks, benefits and alternatives for the proposed anesthesia with the patient or authorized representative who has indicated his/her understanding and acceptance.       Plan Discussed with: CRNA  Anesthesia Plan Comments: (Lab Results      Component                Value               Date                      WBC                      6.5                 03/22/2022                HGB                      13.7                03/22/2022                HCT                      40.6                03/22/2022                MCV                       84.8                03/22/2022                PLT                      305                 03/22/2022           )  Anesthesia Quick Evaluation  

## 2022-04-04 NOTE — Anesthesia Postprocedure Evaluation (Signed)
Anesthesia Post Note  Patient: Julie [name]  Procedure(s) Performed: COMPUTER ASSISTED TOTAL KNEE ARTHROPLASTY (Left: Knee)     Patient location during evaluation: PACU Anesthesia Type: Spinal Level of consciousness: oriented and awake and alert Pain management: pain level controlled Vital Signs Assessment: post-procedure vital signs reviewed and stable Respiratory status: spontaneous breathing, respiratory function stable and patient connected to nasal cannula oxygen Cardiovascular status: blood pressure returned to baseline and stable Postop Assessment: no headache, no backache and no apparent nausea or vomiting Anesthetic complications: no   No notable events documented.  Last Vitals:  Vitals:   04/04/22 1345 04/04/22 1400  BP: 118/72 121/70  Pulse: 77 66  Resp: (!) 21 16  Temp:  (!) 36.4 C  SpO2: 95% 97%    Last Pain:  Vitals:   04/04/22 1400  TempSrc:   PainSc: 0-No pain                 Navya Timmons

## 2022-04-04 NOTE — Transfer of Care (Signed)
Immediate Anesthesia Transfer of Care Note  Patient: Julie [name]  Procedure(s) Performed: COMPUTER ASSISTED TOTAL KNEE ARTHROPLASTY (Left: Knee)  Patient Location: PACU  Anesthesia Type:Spinal  Level of Consciousness: awake and patient cooperative  Airway & Oxygen Therapy: Patient Spontanous Breathing and Patient connected to face mask  Post-op Assessment: Report given to RN and Post -op Vital signs reviewed and stable  Post vital signs: Reviewed and stable  Last Vitals:  Vitals Value Taken Time  BP 98/61 04/04/22 1318  Temp    Pulse 63 04/04/22 1320  Resp 16 04/04/22 1320  SpO2 99 % 04/04/22 1320  Vitals shown include unvalidated device data.  Last Pain:  Vitals:   04/04/22 1000  TempSrc:   PainSc: 0-No pain      Patients Stated Pain Goal: 3 (67/59/16 3846)  Complications: No notable events documented.

## 2022-04-04 NOTE — Op Note (Deleted)
OPERATIVE REPORT  SURGEON: Rod Can, MD   ASSISTANT: Larene Pickett, PA-C  PREOPERATIVE DIAGNOSIS: Primary Left knee arthritis.   POSTOPERATIVE DIAGNOSIS: Primary Left knee arthritis.   PROCEDURE: Computer assisted Left total knee arthroplasty.   IMPLANTS: Zimmer Persona PPS Cementless CR femur, size 8. Persona 0 degree Spiked Keel OsseoTi Tibia, size F. Vivacit-E polyethelyene insert, size 10 mm, CR. TM standard patella, size 35 mm.  ANESTHESIA:  MAC, Regional, and Spinal  TOURNIQUET TIME: Not utilized.   ESTIMATED BLOOD LOSS: 250 mL.    ANTIBIOTICS: 2g Ancef.  DRAINS: None.  COMPLICATIONS: None   CONDITION: PACU - hemodynamically stable.   BRIEF CLINICAL NOTE: Julie [name] is a 67 y.o. female with a long-standing history of Left knee arthritis. After failing conservative management, the patient was indicated for total knee arthroplasty. The risks, benefits, and alternatives to the procedure were explained, and the patient elected to proceed.  PROCEDURE IN DETAIL: Adductor canal block was obtained in the pre-op holding area. Once inside the operative room, spinal anesthesia was obtained, and a foley catheter was inserted. The patient was then positioned and the lower extremity was prepped and draped in the normal sterile surgical fashion.  A time-out was called verifying side and site of surgery. The patient received IV antibiotics within 60 minutes of beginning the procedure. A tourniquet was not utilized.   An anterior approach to the knee was performed utilizing a midvastus arthrotomy. A medial release was performed and the patellar fat pad was excised. Stryker imageless navigation was used to cut the distal femur perpendicular to the mechanical axis. A freehand patellar resection was performed, and the patella was sized an prepared with 1 lug hole.  Nagivation was used to make a neutral proximal tibia resection, taking 9 mm of bone from the less affected lateral  side with 3 degrees of slope. The menisci were excised. A spacer block was placed, and the alignment and balance in extension were confirmed.   The distal femur was sized using the 3-degree external rotation guide referencing the posterior femoral cortex. The appropriate 4-in-1 cutting block was pinned into place. Rotation was checked using Whiteside's line, the epicondylar axis, and then confirmed with a spacer block in flexion. The remaining femoral cuts were performed, taking care to protect the MCL.  The tibia was sized and the trial tray was pinned into place. The remaining trail components were inserted. The knee was stable to varus and valgus stress through a full range of motion. The patella tracked centrally, and the PCL was well balanced. The trial components were removed, and the proximal tibial surface was prepared. Final components were impacted into place. The knee was tested for a final time and found to be well balanced.   The wound was copiously irrigated with Prontosan solution and normal saline using pule lavage.  Marcaine solution was injected into the periarticular soft tissue.  The wound was closed in layers using #1 Vicryl and Stratafix for the fascia, 2-0 Vicryl for the subcutaneous fat, 2-0 Monocryl for the deep dermal layer, 3-0 running Monocryl subcuticular Stitch, and 4-0 Monocryl stay sutures at both ends of the wound. Dermabond was applied to the skin.  Once the glue was fully dried, an Aquacell Ag and compressive dressing were applied.  The patient was transported to the recovery room in stable condition.  Sponge, needle, and instrument counts were correct at the end of the case x2.  The patient tolerated the procedure well and there were no known  complications.  Please note that a surgical assistant was a medical necessity for this procedure in order to perform it in a safe and expeditious manner. Surgical assistant was necessary to retract the ligaments and vital  neurovascular structures to prevent injury to them and also necessary for proper positioning of the limb to allow for anatomic placement of the prosthesis.

## 2022-04-04 NOTE — Discharge Instructions (Signed)
Dr. Rod Can Total Joint Specialist [facility] [address], [place], [place] [address] [phone]  TOTAL KNEE REPLACEMENT POSTOPERATIVE DIRECTIONS    Knee Rehabilitation, Guidelines Following Surgery  Results after knee surgery are often greatly improved when you follow the exercise, range of motion and muscle strengthening exercises prescribed by your doctor. Safety measures are also important to protect the knee from further injury. Any time any of these exercises cause you to have increased pain or swelling in your knee joint, decrease the amount until you are comfortable again and slowly increase them. If you have problems or questions, call your caregiver or physical therapist for advice.   WEIGHT BEARING Weight bearing as tolerated with assist device (walker, cane, etc) as directed, use it as long as suggested by your surgeon or therapist, typically at least 4-6 weeks.  HOME CARE INSTRUCTIONS  Remove items at home which could result in a fall. This includes throw rugs or furniture in walking pathways.  Continue medications as instructed at time of discharge. You may have some home medications which will be placed on hold until you complete the course of blood thinner medication.  You may start showering once you are discharged home but do not submerge the incision under water. Just pat the incision dry and apply a dry gauze dressing on daily. Walk with walker as instructed.  You may resume a sexual relationship in one month or when given the OK by your doctor.  Use walker as long as suggested by your caregivers. Avoid periods of inactivity such as sitting longer than an hour when not asleep. This helps prevent blood clots.  You may put full weight on your legs and walk as much as is comfortable.  You may return to work once you are cleared by your doctor.  Do not drive a car for 6 weeks or until released by you surgeon.  Do not drive while  taking narcotics.  Wear the elastic stockings for three weeks following surgery during the day but you may remove then at night. Make sure you keep all of your appointments after your operation with all of your doctors and caregivers. You should call the office at the above phone number and make an appointment for approximately two weeks after the date of your surgery. Do not remove your surgical dressing. The dressing is waterproof; you may take showers in 3 days, but do not take tub baths or submerge the dressing. Please pick up a stool softener and laxative for home use as long as you are requiring pain medications. ICE to the affected knee every three hours for 30 minutes at a time and then as needed for pain and swelling.  Continue to use ice on the knee for pain and swelling from surgery. You may notice swelling that will progress down to the foot and ankle.  This is normal after surgery.  Elevate the leg when you are not up walking on it.   It is important for you to complete the blood thinner medication as prescribed by your doctor. Continue to use the breathing machine which will help keep your temperature down.  It is common for your temperature to cycle up and down following surgery, especially at night when you are not up moving around and exerting yourself.  The breathing machine keeps your lungs expanded and your temperature down.  RANGE OF MOTION AND STRENGTHENING EXERCISES  Rehabilitation of the knee is important following a knee injury or an  operation. After just a few days of immobilization, the muscles of the thigh which control the knee become weakened and shrink (atrophy). Knee exercises are designed to build up the tone and strength of the thigh muscles and to improve knee motion. Often times heat used for twenty to thirty minutes before working out will loosen up your tissues and help with improving the range of motion but do not use heat for the first two weeks following surgery.  These exercises can be done on a training (exercise) mat, on the floor, on a table or on a bed. Use what ever works the best and is most comfortable for you Knee exercises include:  Leg Lifts - While your knee is still immobilized in a splint or cast, you can do straight leg raises. Lift the leg to 60 degrees, hold for 3 sec, and slowly lower the leg. Repeat 10-20 times 2-3 times daily. Perform this exercise against resistance later as your knee gets better.  Quad and Hamstring Sets - Tighten up the muscle on the front of the thigh (Quad) and hold for 5-10 sec. Repeat this 10-20 times hourly. Hamstring sets are done by pushing the foot backward against an object and holding for 5-10 sec. Repeat as with quad sets.  A rehabilitation program following serious knee injuries can speed recovery and prevent re-injury in the future due to weakened muscles. Contact your doctor or a physical therapist for more information on knee rehabilitation.   POST-OPERATIVE OPIOID TAPER INSTRUCTIONS: It is important to wean off of your opioid medication as soon as possible. If you do not need pain medication after your surgery it is ok to stop day one. Opioids include: Codeine, Hydrocodone(Norco, Vicodin), Oxycodone(Percocet, oxycontin) and hydromorphone amongst others.  Long term and even short term use of opiods can cause: Increased pain response Dependence Constipation Depression Respiratory depression And more.  Withdrawal symptoms can include Flu like symptoms Nausea, vomiting And more Techniques to manage these symptoms Hydrate well Eat regular healthy meals Stay active Use relaxation techniques(deep breathing, meditating, yoga) Do Not substitute Alcohol to help with tapering If you have been on opioids for less than two weeks and do not have pain than it is ok to stop all together.  Plan to wean off of opioids This plan should start within one week post op of your joint replacement. Maintain the same  interval or time between taking each dose and first decrease the dose.  Cut the total daily intake of opioids by one tablet each day Next start to increase the time between doses. The last dose that should be eliminated is the evening dose.    SKILLED REHAB INSTRUCTIONS: If the patient is transferred to a skilled rehab facility following release from the hospital, a list of the current medications will be sent to the facility for the patient to continue.  When discharged from the skilled rehab facility, please have the facility set up the patient's [place] prior to being released. Also, the skilled facility will be responsible for providing the patient with their medications at time of release from the facility to include their pain medication, the muscle relaxants, and their blood thinner medication. If the patient is still at the rehab facility at time of the two week follow up appointment, the skilled rehab facility will also need to assist the patient in arranging follow up appointment in our office and any transportation needs.  MAKE SURE YOU:  Understand these instructions.  Will watch  your condition.  Will get help right away if you are not doing well or get worse.    Pick up stool softner and laxative for home use following surgery while on pain medications. Do NOT remove your dressing. You may shower.  Do not take tub baths or submerge incision under water. May shower starting three days after surgery. Please use a clean towel to pat the incision dry following showers. Continue to use ice for pain and swelling after surgery. Do not use any lotions or creams on the incision until instructed by your surgeon.  

## 2022-04-04 NOTE — Anesthesia Procedure Notes (Signed)
Spinal  Start time: 04/04/2022 10:58 AM End time: 04/04/2022 11:01 AM Reason for block: surgical anesthesia Staffing Performed: anesthesiologist  Anesthesiologist: Effie Berkshire, MD Performed by: Effie Berkshire, MD Authorized by: Effie Berkshire, MD   Preanesthetic Checklist Completed: patient identified, IV checked, site marked, risks and benefits discussed, surgical consent, monitors and equipment checked, pre-op evaluation and timeout performed Spinal Block Patient position: sitting Prep: DuraPrep and site prepped and draped Location: L3-4 Injection technique: single-shot Needle Needle type: Pencan  Needle gauge: 24 G Needle length: 10 cm Needle insertion depth: 10 cm Assessment Events: CSF return Additional Notes Patient tolerated well. No immediate complications.  Functioning IV was confirmed and monitors were applied. Sterile prep and drape, including hand hygiene and sterile gloves were used. The patient was positioned and the back was prepped. The skin was anesthetized with lidocaine. Free flow of clear CSF was obtained prior to injecting local anesthetic into the CSF. The spinal needle aspirated freely following injection. The needle was carefully withdrawn. The patient tolerated the procedure well.

## 2022-04-04 NOTE — Evaluation (Signed)
Physical Therapy Evaluation Patient Details Name: Julie [name] MRN: [id number] DOB: 01/02/1956 Today's Date: 04/04/2022  Clinical Impression  Julie [name] is a 67 y.o. female POD 0 s/p L-TKA evaluated in PACU for possible same-day discharge, husband Jenny Reichmann present and very engaged with therapy. Patient reports independence with mobility at baseline. Patient is now limited by functional impairments (see PT problem list below) and requires min guard for bed mobility and for transfers. Patient was able to ambulate 80 feet with RW and min guard level of assist. After attempting to void in restroom, pt reporting dizziness, asked NT to provide recliner. While attempting to transfer and ambulate from commode to recliner, pt reported increased dizziness, utilized mod assist to bring pt back to sitting on toilet. During this time pt became pale and decreased in responsiveness to queries, called NT and RN into restroom and despite cool washcloth and noxious stimuli pt still demonstrated decreased responsiveness. Pt required total assist +3 to transfer commode to [facility] and then to bed; once in bed BP in supine 105/61 with HR 69; pt with increased responsiveness and color returning while in supine, further mobility deferred. Patient will benefit from continued skilled PT interventions to address impairments and progress towards PLOF. Acute PT will follow to progress mobility and stair training in preparation for safe discharge home.        04/04/22 1500  PT Visit Information  Last PT Received On 04/04/22  Assistance Needed +1  History of Present Illness Pt is a 67yo female presenting s/p L-TKA on 04/04/22. PMH: Anemia, angina, anxiety with hx of panic attacks, CKD, fibromyalgia, hepatitis, HTN, hypothyroidism, PTSD, s/p heart cath.  Precautions  Precautions Fall;Knee  Precaution Booklet Issued No  Precaution Comments Pt had vasovagal episode during PT eval, stay close. No pillow under the knee   Restrictions  Weight Bearing Restrictions No  Other Position/Activity Restrictions wbat  Home Living  Family/patient expects to be discharged to: Private residence  Living Arrangements Spouse/significant other  Available Help at Discharge Family;Available 24 hours/day  Type of [place] to enter  Entrance Stairs-Number of Steps 1  Entrance Stairs-Rails None  Home Layout One level  Bathroom Shower/Tub Walk-in Warden/ranger seat - built in  Prior Function  Prior Level of Function  Independent/Modified Independent  Mobility Comments IND  ADLs Comments IND  Communication  Communication No difficulties  Pain Assessment  Pain Assessment 0-10  Pain Score 2  Pain Location left knee  Pain Descriptors / Indicators Operative site guarding  Pain Intervention(s) Limited activity within patient's tolerance;Monitored during session;Repositioned;Ice applied  Cognition  Arousal/Alertness Awake/alert  Behavior During Therapy WFL for tasks assessed/performed  Overall Cognitive Status Within Functional Limits for tasks assessed  Upper Extremity Assessment  Upper Extremity Assessment Overall WFL for tasks assessed  Lower Extremity Assessment  Lower Extremity Assessment RLE deficits/detail;LLE deficits/detail  RLE Deficits / Details MMT ank DF/PF 5/5  RLE Sensation WNL  LLE Deficits / Details MMT ank DF/PF 5/5, no extensor lag noted  LLE Sensation WNL  Cervical / Trunk Assessment  Cervical / Trunk Assessment Normal  Bed Mobility  Overal bed mobility Needs Assistance  Bed Mobility Supine to Sit  Supine to sit Min guard;HOB elevated  General bed mobility comments For safety only, no physical assist required  Transfers  Overall transfer level Needs assistance  Equipment used Rolling walker (2 wheels)  Transfers Sit to/from Stand  Sit to Stand Min guard;From  elevated surface  General transfer comment For safety only, VCs for  hand placement and powering up from stretcher.  Ambulation/Gait  Ambulation/Gait assistance Min guard  Gait Distance (Feet) 80 Feet  Assistive device Rolling walker (2 wheels)  Gait Pattern/deviations Step-to pattern  General Gait Details Pt ambulated with RW and min guard, no physical assist required or overt LOB noted, husband providing some min guard as well for education. While pt attempting to void on toilet became dizzy, pale, and reduced responsiveness. Called NT and RN in to help, required total assist +3 for toilet to [facility] transfer and return to supine in bed, once in supine BP 105/61, HR 69, responsiveness returned as did color. Further mobility deferred  Gait velocity decreased  Balance  Overall balance assessment Needs assistance  Sitting-balance support Feet supported;No upper extremity supported  Sitting balance-Leahy Scale Good  Standing balance support During functional activity;Bilateral upper extremity supported;Reliant on assistive device for balance  Standing balance-Leahy Scale Poor  General Comments  General comments (skin integrity, edema, etc.) Husband John present  Exercises  Exercises Total Joint  PT - End of Session  Equipment Utilized During Treatment Gait belt  Activity Tolerance Treatment limited secondary to medical complications (Comment) (Orthostatic and syncope)  Patient left in bed;with call bell/phone within reach;with nursing/sitter in room;with family/visitor present  Nurse Communication Mobility status;Other (comment) (Orthostatic)  PT Assessment  PT Recommendation/Assessment Patient needs continued PT services  PT Visit Diagnosis Pain;Difficulty in walking, not elsewhere classified (R26.2)  Pain - Right/Left Left  Pain - part of body Knee  PT Problem List Decreased strength;Decreased range of motion;Decreased activity tolerance;Decreased balance;Decreased mobility;Pain  PT Plan  PT Frequency (ACUTE ONLY) 7X/week  PT Treatment/Interventions (ACUTE  ONLY) DME instruction;Gait training;Stair training;Functional mobility training;Therapeutic activities;Therapeutic exercise;Balance training;Neuromuscular re-education;Patient/family education  AM-PAC PT "6 Clicks" Mobility Outcome Measure (Version 2)  Help needed turning from your back to your side while in a flat bed without using bedrails? 4  Help needed moving from lying on your back to sitting on the side of a flat bed without using bedrails? 3  Help needed moving to and from a bed to a chair (including a wheelchair)? 3  Help needed standing up from a chair using your arms (e.g., wheelchair or bedside chair)? 3  Help needed to walk in hospital room? 3  Help needed climbing 3-5 steps with a railing?  3  6 Click Score 19  Consider Recommendation of Discharge To: Home with [facility]  Progressive Mobility  What is the highest level of mobility based on the progressive mobility assessment? Level 5 (Walks with assist in room/hall) - Balance while stepping forward/back and can walk in room with assist - Complete  Activity Ambulated with assistance in hallway  PT Recommendation  Follow Up Recommendations Follow physician's recommendations for discharge plan and follow up therapies  Assistance recommended at discharge Frequent or constant Supervision/Assistance  Patient can return home with the following A little help with walking and/or transfers;A little help with bathing/dressing/bathroom;Assistance with cooking/housework;Assist for transportation;Help with stairs or ramp for entrance  Functional Status Assessment Patient has had a recent decline in their functional status and demonstrates the ability to make significant improvements in function in a reasonable and predictable amount of time.  PT equipment Rolling walker (2 wheels)  Individuals Consulted  Consulted and Agree with Results and Recommendations Patient  Acute Rehab PT Goals  Patient Stated Goal Walk with my dog  PT Goal Formulation With  patient  Time For Goal Achievement  04/11/22  Potential to Achieve Goals Good  PT Time Calculation  PT Start Time (ACUTE ONLY) 1515  PT Stop Time (ACUTE ONLY) 1539  PT Time Calculation (min) (ACUTE ONLY) 24 min  PT General Charges  $$ ACUTE PT VISIT 1 Visit  PT Evaluation  $PT Eval Low Complexity 1 Low  Written Expression  Dominant Hand Right   Coolidge Breeze, PT, DPT [facility] Rehabilitation Department Office: [phone] Weekend pager: [phone]  Nahomy Limburg 04/04/2022, 7:28 PM

## 2022-04-04 NOTE — Plan of Care (Signed)
  Problem: Pain Management: Goal: Pain level will decrease with appropriate interventions Outcome: Progressing   Problem: Pain Managment: Goal: General experience of comfort will improve Outcome: Progressing   Problem: Safety: Goal: Ability to remain free from injury will improve Outcome: Progressing

## 2022-04-04 NOTE — Interval H&P Note (Signed)
History and Physical Interval Note:  04/04/2022 9:40 AM  Julie [name]  has presented today for surgery, with the diagnosis of Left knee osteoarthritis.  The various methods of treatment have been discussed with the patient and family. After consideration of risks, benefits and other options for treatment, the patient has consented to  Procedure(s) with comments: [place] (Left) - 160 as a surgical intervention.  The patient's history has been reviewed, patient examined, no change in status, stable for surgery.  I have reviewed the patient's chart and labs.  Questions were answered to the patient's satisfaction.     Hilton Cork Gurkirat Basher

## 2022-04-04 NOTE — Op Note (Addendum)
OPERATIVE REPORT  SURGEON: Rod Can, MD   ASSISTANT: Larene Pickett, PA-C  PREOPERATIVE DIAGNOSIS: Primary Left knee arthritis.   POSTOPERATIVE DIAGNOSIS: Primary Left knee arthritis.   PROCEDURE: Computer assisted Left total knee arthroplasty.   IMPLANTS: Zimmer Persona Tivanium Cemented CR femur, size 8. Persona 0 degree Spiked Keel OsseoTi Tibia, size D. Vivacit-E polyethelyene insert, size 10 mm, CR. TM standard patella, size 32 mm. Refobacin bone cement.  ANESTHESIA:  MAC, Regional, and Spinal  TOURNIQUET TIME: Not utilized.   ESTIMATED BLOOD LOSS:-100 mL    ANTIBIOTICS: 2g Ancef.  DRAINS: None.  COMPLICATIONS: None   CONDITION: PACU - hemodynamically stable.   BRIEF CLINICAL NOTE: Julie [name] is a 67 y.o. female with a long-standing history of Left knee arthritis. After failing conservative management, the patient was indicated for nickel free total knee arthroplasty. The risks, benefits, and alternatives to the procedure were explained, and the patient elected to proceed.  PROCEDURE IN DETAIL: Adductor canal block was obtained in the pre-op holding area. Once inside the operative room, spinal anesthesia was obtained, and a foley catheter was inserted. The patient was then positioned and the lower extremity was prepped and draped in the normal sterile surgical fashion.  A time-out was called verifying side and site of surgery. The patient received IV antibiotics within 60 minutes of beginning the procedure. A tourniquet was not utilized.   An anterior approach to the knee was performed utilizing a midvastus arthrotomy. A medial release was performed and the patellar fat pad was excised. Stryker imageless navigation was used to cut the distal femur perpendicular to the mechanical axis. A freehand patellar resection was performed, and the patella was sized an prepared with 3 lug holes.  Nagivation was used to make a neutral proximal tibia resection, taking 9 mm of  bone from the less affected lateral side with 3 degrees of slope. The menisci were excised. A spacer block was placed, and the alignment and balance in extension were confirmed.   The distal femur was sized using the 3-degree external rotation guide referencing the posterior femoral cortex. The appropriate 4-in-1 cutting block was pinned into place. Rotation was checked using Whiteside's line, the epicondylar axis, and then confirmed with a spacer block in flexion. The remaining femoral cuts were performed, taking care to protect the MCL.  The tibia was sized and the trial tray was pinned into place. The remaining trail components were inserted. The knee was stable to varus and valgus stress through a full range of motion. The patella tracked centrally, and the PCL was well balanced. The trial components were removed, and the proximal tibial surface was prepared. Final tibial and patellar components were impacted into place. The femoral component was cemented into place. Excess cement was cleared. The knee was brought into extension while the cement polymerized. The knee was tested for a final time and found to be well balanced.   The wound was copiously irrigated with Prontosan solution and normal saline using pulse lavage.  Marcaine solution was injected into the periarticular soft tissue.  The wound was closed in layers using #1 Vicryl and Stratafix for the fascia, 2-0 Vicryl for the subcutaneous fat, 2-0 Monocryl for the deep dermal layer, 3-0 running Monocryl subcuticular Stitch, and 4-0 Monocryl stay sutures at both ends of the wound. Dermabond was applied to the skin.  Once the glue was fully dried, an Aquacell Ag and compressive dressing were applied.  The patient was transported to the recovery room in stable  condition.  Sponge, needle, and instrument counts were correct at the end of the case x2.  The patient tolerated the procedure well and there were no known complications.  Please note that a  surgical assistant was a medical necessity for this procedure in order to perform it in a safe and expeditious manner. Surgical assistant was necessary to retract the ligaments and vital neurovascular structures to prevent injury to them and also necessary for proper positioning of the limb to allow for anatomic placement of the prosthesis.

## 2022-04-04 NOTE — Anesthesia Procedure Notes (Signed)
Anesthesia Regional Block: Adductor canal block   Pre-Anesthetic Checklist: , timeout performed,  Correct Patient, Correct Site, Correct Laterality,  Correct Procedure, Correct Position, site marked,  Risks and benefits discussed,  Surgical consent,  Pre-op evaluation,  At surgeon's request and post-op pain management  Laterality: Left  Prep: chloraprep       Needles:  Injection technique: Single-shot  Needle Type: Echogenic Stimulator Needle     Needle Length: 9cm  Needle Gauge: 21     Additional Needles:   Procedures:,,,, ultrasound used (permanent image in chart),,    Narrative:  Start time: 04/04/2022 9:55 AM End time: 04/04/2022 10:00 AM Injection made incrementally with aspirations every 5 mL.  Performed by: Personally  Anesthesiologist: Effie Berkshire, MD  Additional Notes: Discussed risks and benefits of the nerve block in detail, including but not limited vascular injury, permanent nerve damage and infection.   Patient tolerated the procedure well. Local anesthetic introduced in an incremental fashion under minimal resistance after negative aspirations. No paresthesias were elicited. After completion of the procedure, no acute issues were identified and patient continued to be monitored by RN.

## 2022-04-04 NOTE — Care Plan (Signed)
Ortho Bundle Case Management Note  Patient Details  Name: Julie [name] MRN: [id number] Date of Birth: 04-08-1955                  L TKA on 04/04/22.  DCP: Home with husband.  DME: RW ordered through [facility].  PT: Quinn Plowman 2/5   DME Arranged:  Walker rolling DME Agency:  [employer]    Additional Comments: Please contact me with any questions of if this plan should need to change.   Dario Ave, Case Manager  [facility]  [phone] 04/04/2022, 4:28 PM

## 2022-04-05 ENCOUNTER — Encounter (HOSPITAL_COMMUNITY): Payer: Self-pay | Admitting: Orthopedic Surgery

## 2022-04-05 ENCOUNTER — Other Ambulatory Visit (HOSPITAL_COMMUNITY): Payer: Self-pay

## 2022-04-05 DIAGNOSIS — Z79899 Other long term (current) drug therapy: Secondary | ICD-10-CM | POA: Diagnosis not present

## 2022-04-05 DIAGNOSIS — M1712 Unilateral primary osteoarthritis, left knee: Secondary | ICD-10-CM | POA: Diagnosis not present

## 2022-04-05 DIAGNOSIS — I129 Hypertensive chronic kidney disease with stage 1 through stage 4 chronic kidney disease, or unspecified chronic kidney disease: Secondary | ICD-10-CM | POA: Diagnosis not present

## 2022-04-05 DIAGNOSIS — N189 Chronic kidney disease, unspecified: Secondary | ICD-10-CM | POA: Diagnosis not present

## 2022-04-05 DIAGNOSIS — J45909 Unspecified asthma, uncomplicated: Secondary | ICD-10-CM | POA: Diagnosis not present

## 2022-04-05 DIAGNOSIS — Z96652 Presence of left artificial knee joint: Secondary | ICD-10-CM | POA: Diagnosis not present

## 2022-04-05 DIAGNOSIS — E039 Hypothyroidism, unspecified: Secondary | ICD-10-CM | POA: Diagnosis not present

## 2022-04-05 DIAGNOSIS — Z7982 Long term (current) use of aspirin: Secondary | ICD-10-CM | POA: Diagnosis not present

## 2022-04-05 LAB — CBC
HCT: 28 % — ABNORMAL LOW (ref 36.0–46.0)
Hemoglobin: 9.5 g/dL — ABNORMAL LOW (ref 12.0–15.0)
MCH: 28.9 pg (ref 26.0–34.0)
MCHC: 33.9 g/dL (ref 30.0–36.0)
MCV: 85.1 fL (ref 80.0–100.0)
Platelets: 218 10*3/uL (ref 150–400)
RBC: 3.29 MIL/uL — ABNORMAL LOW (ref 3.87–5.11)
RDW: 12.6 % (ref 11.5–15.5)
WBC: 8.8 10*3/uL (ref 4.0–10.5)
nRBC: 0 % (ref 0.0–0.2)

## 2022-04-05 LAB — BASIC METABOLIC PANEL
Anion gap: 8 (ref 5–15)
BUN: 13 mg/dL (ref 8–23)
CO2: 22 mmol/L (ref 22–32)
Calcium: 8 mg/dL — ABNORMAL LOW (ref 8.9–10.3)
Chloride: 101 mmol/L (ref 98–111)
Creatinine, Ser: 0.92 mg/dL (ref 0.44–1.00)
GFR, Estimated: >60 mL/min (ref >60)
Glucose, Bld: 122 mg/dL — ABNORMAL HIGH (ref 70–99)
Potassium: 3.9 mmol/L (ref 3.5–5.1)
Sodium: 131 mmol/L — ABNORMAL LOW (ref 135–145)

## 2022-04-05 NOTE — Progress Notes (Signed)
Physical Therapy Treatment Patient Details Name: Julie [name] MRN: [id number] DOB: 11-Aug-1955 Today's Date: 04/05/2022   History of Present Illness Pt is a 67yo female presenting s/p L-TKA on 04/04/22. PMH: Anemia, angina, anxiety with hx of panic attacks, CKD, fibromyalgia, hepatitis, HTN, hypothyroidism, PTSD, s/p heart cath.    PT Comments    Patient eager to go home. Ambulated and practiced 1 step. Patient has  mt PT goals for Dc. Soulse present for instruction on steps and HEP.  Patient di not complain of nausea. RN aware.   Recommendations for follow up therapy are one component of a multi-disciplinary discharge planning process, led by the attending physician.  Recommendations may be updated based on patient status, additional functional criteria and insurance authorization.  Follow Up Recommendations  Follow physician's recommendations for discharge plan and follow up therapies     Assistance Recommended at Discharge Intermittent Supervision/Assistance  Patient can return home with the following A little help with walking and/or transfers;A little help with bathing/dressing/bathroom;Assistance with cooking/housework;Assist for transportation;Help with stairs or ramp for entrance   Equipment Recommendations  Rolling walker (2 wheels)    Recommendations for Other Services       Precautions / Restrictions Precautions Precautions: Fall;Knee Restrictions Weight Bearing Restrictions: No     Mobility  Bed Mobility   Bed Mobility: Supine to Sit     Supine to sit: Supervision     General bed mobility comments: For safety only, no physical assist required    Transfers Overall transfer level: Needs assistance Equipment used: Rolling walker (2 wheels) Transfers: Sit to/from Stand Sit to Stand: Supervision           General transfer comment: For safety only, VCs for hand placement and powering up from bed and from toulet, used rail     Ambulation/Gait Ambulation/Gait assistance: Supervision Gait Distance (Feet): 40 Feet Assistive device: Rolling walker (2 wheels) Gait Pattern/deviations: Step-to pattern, Step-through pattern Gait velocity: decreased     General Gait Details: patient reports some nausea, ambulated in room and to BR   Stairs Stairs: Yes Stairs assistance: Min guard Stair Management: No rails, With walker Number of Stairs: 1 General stair comments: spouse present to assist patient.   Wheelchair Mobility    Modified Rankin (Stroke Patients Only)       Balance Overall balance assessment: Needs assistance Sitting-balance support: Feet supported, No upper extremity supported Sitting balance-Leahy Scale: Good     Standing balance support: Bilateral upper extremity supported, During functional activity, Reliant on assistive device for balance Standing balance-Leahy Scale: Fair                              Cognition Arousal/Alertness: Awake/alert Behavior During Therapy: WFL for tasks assessed/performed                                            Exercises Total Joint Exercises Ankle Circles/Pumps: AROM, Both, 10 reps, 5 reps Quad Sets: AROM, Both, 5 reps Heel Slides: AROM, Left, 5 reps Hip ABduction/ADduction: AROM, Left, 5 reps Straight Leg Raises: AROM, Left, 5 reps    General Comments        Pertinent Vitals/Pain Pain Assessment Pain Score: 3  Pain Location: left knee Pain Descriptors / Indicators: Operative site guarding, Discomfort Pain Intervention(s): Monitored during session, Premedicated before session  Home Living                          Prior Function            PT Goals (current goals can now be found in the care plan section) Progress towards PT goals: Progressing toward goals    Frequency    7X/week      PT Plan Current plan remains appropriate    Co-evaluation              AM-PAC PT "6  Clicks" Mobility   Outcome Measure  Help needed turning from your back to your side while in a flat bed without using bedrails?: None Help needed moving from lying on your back to sitting on the side of a flat bed without using bedrails?: None Help needed moving to and from a bed to a chair (including a wheelchair)?: A Little Help needed standing up from a chair using your arms (e.g., wheelchair or bedside chair)?: A Little Help needed to walk in hospital room?: A Little Help needed climbing 3-5 steps with a railing? : A Little 6 Click Score: 20    End of Session Equipment Utilized During Treatment: Gait belt Activity Tolerance: Patient tolerated treatment well Patient left: in bed;with family/visitor present Nurse Communication: Mobility status PT Visit Diagnosis: Unsteadiness on feet (R26.81) Pain - Right/Left: Left Pain - part of body: Knee     Time: 1421-1440 PT Time Calculation (min) (ACUTE ONLY): 19 min  Charges:  $Gait Training: 8-22 mins                      [place] Office [phone] Weekend [phone]    Claretha [facility] 04/05/2022, 3:12 PM

## 2022-04-05 NOTE — Progress Notes (Signed)
Physical Therapy Treatment Patient Details Name: Julie [name] MRN: [id number] DOB: Jan 10, 1956 Today's Date: 04/05/2022   History of Present Illness Pt is a 67yo female presenting s/p L-TKA on 04/04/22. PMH: Anemia, angina, anxiety with hx of panic attacks, CKD, fibromyalgia, hepatitis, HTN, hypothyroidism, PTSD, s/p heart cath.    PT Comments    Patient  tolerated short distance ambulation in room. Patient reports some nausea.  Patient eager to Dc home. Will see in PM  for gait and exercise and steps.   Recommendations for follow up therapy are one component of a multi-disciplinary discharge planning process, led by the attending physician.  Recommendations may be updated based on patient status, additional functional criteria and insurance authorization.  Follow Up Recommendations  Follow physician's recommendations for discharge plan and follow up therapies     Assistance Recommended at Discharge Frequent or constant Supervision/Assistance  Patient can return home with the following A little help with walking and/or transfers;A little help with bathing/dressing/bathroom;Assistance with cooking/housework;Assist for transportation;Help with stairs or ramp for entrance   Equipment Recommendations  Rolling walker (2 wheels)    Recommendations for Other Services       Precautions / Restrictions Precautions Precautions: Fall;Knee Restrictions Weight Bearing Restrictions: No     Mobility  Bed Mobility   Bed Mobility: Supine to Sit     Supine to sit: Min guard     General bed mobility comments: For safety only, no physical assist required    Transfers Overall transfer level: Needs assistance Equipment used: Rolling walker (2 wheels) Transfers: Sit to/from Stand Sit to Stand: Min guard           General transfer comment: For safety only, VCs for hand placement and powering up from bed and from toulet, used rail    Ambulation/Gait Ambulation/Gait assistance: Min  guard Gait Distance (Feet): 40 Feet (then 20' x 2) Assistive device: Rolling walker (2 wheels) Gait Pattern/deviations: Step-to pattern, Step-through pattern Gait velocity: decreased     General Gait Details: patient reports some nausea, ambulated in room and to BR   Stairs             Wheelchair Mobility    Modified Rankin (Stroke Patients Only)       Balance Overall balance assessment: Needs assistance Sitting-balance support: Feet supported, No upper extremity supported Sitting balance-Leahy Scale: Good     Standing balance support: Bilateral upper extremity supported, During functional activity, Reliant on assistive device for balance Standing balance-Leahy Scale: Fair                              Cognition Arousal/Alertness: Awake/alert Behavior During Therapy: WFL for tasks assessed/performed                                            Exercises      General Comments        Pertinent Vitals/Pain Pain Assessment Pain Score: 3  Pain Location: left knee Pain Descriptors / Indicators: Operative site guarding, Discomfort Pain Intervention(s): Monitored during session, Premedicated before session, Ice applied    Home Living                          Prior Function            PT Goals (  current goals can now be found in the care plan section) Progress towards PT goals: Progressing toward goals    Frequency    7X/week      PT Plan Current plan remains appropriate    Co-evaluation              AM-PAC PT "6 Clicks" Mobility   Outcome Measure  Help needed turning from your back to your side while in a flat bed without using bedrails?: None Help needed moving from lying on your back to sitting on the side of a flat bed without using bedrails?: A Little Help needed moving to and from a bed to a chair (including a wheelchair)?: A Little Help needed standing up from a chair using your arms (e.g.,  wheelchair or bedside chair)?: A Little Help needed to walk in hospital room?: A Little Help needed climbing 3-5 steps with a railing? : A Little 6 Click Score: 19    End of Session Equipment Utilized During Treatment: Gait belt Activity Tolerance: Patient tolerated treatment well Patient left: in chair;with family/visitor present Nurse Communication: Mobility status;Other (comment) PT Visit Diagnosis: Pain;Difficulty in walking, not elsewhere classified (R26.2) Pain - Right/Left: Left Pain - part of body: Knee     Time: 4287-6811 PT Time Calculation (min) (ACUTE ONLY): 35 min  Charges:  $Gait Training: 8-22 mins $Self Care/Home Management: [place] Office [phone] Weekend [phone]    Claretha [facility] 04/05/2022, 1:14 PM

## 2022-04-05 NOTE — Plan of Care (Signed)
  Problem: Education: Goal: Knowledge of the prescribed therapeutic regimen will improve 04/05/2022 1459 by Lise Auer, LPN Outcome: Adequate for Discharge 04/05/2022 0756 by Lise Auer, LPN Outcome: Progressing Goal: Individualized Educational Video(s) Outcome: Adequate for Discharge   Problem: Activity: Goal: Ability to avoid complications of mobility impairment will improve 04/05/2022 1459 by Lise Auer, LPN Outcome: Adequate for Discharge 04/05/2022 0756 by Lise Auer, LPN Outcome: Progressing Goal: Range of joint motion will improve Outcome: Adequate for Discharge   Problem: Clinical Measurements: Goal: Postoperative complications will be avoided or minimized Outcome: Adequate for Discharge   Problem: Pain Management: Goal: Pain level will decrease with appropriate interventions 04/05/2022 1459 by Lise Auer, LPN Outcome: Adequate for Discharge 04/05/2022 0756 by Lise Auer, LPN Outcome: Progressing   Problem: Skin Integrity: Goal: Will show signs of wound healing Outcome: Adequate for Discharge   Problem: Education: Goal: Knowledge of General Education information will improve Description: Including pain rating scale, medication(s)/side effects and non-pharmacologic comfort measures Outcome: Adequate for Discharge   Problem: Health Behavior/Discharge Planning: Goal: Ability to manage health-related needs will improve Outcome: Adequate for Discharge   Problem: Clinical Measurements: Goal: Ability to maintain clinical measurements within normal limits will improve Outcome: Adequate for Discharge Goal: Will remain free from infection Outcome: Adequate for Discharge Goal: Diagnostic test results will improve Outcome: Adequate for Discharge Goal: Respiratory complications will improve Outcome: Adequate for Discharge Goal: Cardiovascular complication will be avoided Outcome: Adequate for Discharge   Problem: Activity: Goal: Risk for  activity intolerance will decrease Outcome: Adequate for Discharge   Problem: Nutrition: Goal: Adequate nutrition will be maintained Outcome: Adequate for Discharge   Problem: Coping: Goal: Level of anxiety will decrease Outcome: Adequate for Discharge   Problem: Elimination: Goal: Will not experience complications related to bowel motility Outcome: Adequate for Discharge Goal: Will not experience complications related to urinary retention Outcome: Adequate for Discharge   Problem: Pain Managment: Goal: General experience of comfort will improve Outcome: Adequate for Discharge   Problem: Safety: Goal: Ability to remain free from injury will improve Outcome: Adequate for Discharge   Problem: Skin Integrity: Goal: Risk for impaired skin integrity will decrease Outcome: Adequate for Discharge

## 2022-04-05 NOTE — Plan of Care (Signed)
  Problem: Education: Goal: Knowledge of the prescribed therapeutic regimen will improve Outcome: Progressing   Problem: Activity: Goal: Ability to avoid complications of mobility impairment will improve Outcome: Progressing   Problem: Pain Management: Goal: Pain level will decrease with appropriate interventions Outcome: Progressing

## 2022-04-05 NOTE — Progress Notes (Signed)
    Subjective:  Patient reports pain as mild to moderate.  Currently denies N/V/CP/SOB/Abd pain. She denies any dizziness. She states she had 2 episodes of vomiting overnight. She states that she is not having any symptoms now. She denies any tingling or numbness in LE bilaterally.   Objective:   VITALS:   Vitals:   04/04/22 2148 04/05/22 0108 04/05/22 0419 04/05/22 0849  BP: 137/73 137/70 134/73 133/83  Pulse: 77 84 78 71  Resp: '15 18 18 15  '$ Temp: (!) 97.4 F (36.3 C) 97.7 F (36.5 C) 98.1 F (36.7 C) 98.5 F (36.9 C)  TempSrc: Oral Oral Oral Oral  SpO2: 98% 97% 97% 93%  Weight:      Height:        Patient lying in bed. NAD.  Neurologically intact ABD soft Neurovascular intact Sensation intact distally Intact pulses distally Dorsiflexion/Plantar flexion intact Incision: dressing C/D/I No cellulitis present Compartment soft   Lab Results  Component Value Date   WBC 8.8 04/05/2022   HGB 9.5 (L) 04/05/2022   HCT 28.0 (L) 04/05/2022   MCV 85.1 04/05/2022   PLT 218 04/05/2022   BMET    Component Value Date/Time   NA 131 (L) 04/05/2022 0321   NA 135 12/27/2021 1025   K 3.9 04/05/2022 0321   CL 101 04/05/2022 0321   CO2 22 04/05/2022 0321   GLUCOSE 122 (H) 04/05/2022 0321   BUN 13 04/05/2022 0321   BUN 14 12/27/2021 1025   CREATININE 0.92 04/05/2022 0321   CALCIUM 8.0 (L) 04/05/2022 0321   EGFR 64 12/27/2021 1025   GFRNONAA >60 04/05/2022 0321     Assessment/Plan: 1 Day Post-Op   Principal Problem:   Osteoarthritis of left knee   WBAT with walker DVT ppx: Aspirin, SCDs, TEDS PO pain control PT/OT: Patient ambulated 80 feet with PT yesterday. She had an potential vasovagal episode when going to the bathroom Patient kept overnight. Continue PT today.  Dispo: D/c home once cleared with PT and nausea/vomiting controlled.    Charlott Rakes, PA-C 04/05/2022, 10:15 AM   [facility]  [place] [address], [address], [place], [place]  [address] Phone: [phone] [email or web address] Facebook  [employer]

## 2022-04-05 NOTE — TOC Transition Note (Signed)
Transition of Care [facility]) - CM/SW Discharge Note   Patient Details  Name: Julie [name] MRN: [id number] Date of Birth: May 09, 1955  Transition of [facility]) CM/SW Contact:  Lennart Pall, LCSW Phone Number: 04/05/2022, 2:07 PM   Clinical Narrative:     Met with pt and confirming she has received RW to room via [facility] [place].  OPPT already arranged with [facility].  No TOC needs.  Final next level of care: OP Rehab Barriers to Discharge: No Barriers Identified   Patient Goals and CMS Choice      Discharge Placement                         Discharge Plan and Services Additional resources added to the After Visit Summary for                  DME Arranged: Walker rolling DME Agency: [place]                  Social Determinants of Health (SDOH) Interventions SDOH Screenings   Food Insecurity: No Food Insecurity (04/04/2022)  Housing: Low Risk  (04/04/2022)  Transportation Needs: No Transportation Needs (04/04/2022)  Utilities: Not At Risk (04/04/2022)  Alcohol Screen: Low Risk  (08/01/2021)  Depression (PHQ2-9): Low Risk  (12/27/2021)  Financial Resource Strain: Low Risk  (08/01/2021)  Physical Activity: Insufficiently Active (08/01/2021)  Social Connections: Moderately Integrated (08/01/2021)  Stress: Stress Concern Present (08/01/2021)  Tobacco Use: Low Risk  (04/04/2022)     Readmission Risk Interventions     No data to display

## 2022-04-09 DIAGNOSIS — M25662 Stiffness of left knee, not elsewhere classified: Secondary | ICD-10-CM | POA: Diagnosis not present

## 2022-04-09 DIAGNOSIS — M25562 Pain in left knee: Secondary | ICD-10-CM | POA: Diagnosis not present

## 2022-04-09 DIAGNOSIS — R269 Unspecified abnormalities of gait and mobility: Secondary | ICD-10-CM | POA: Diagnosis not present
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
## 2022-04-10 ENCOUNTER — Telehealth: Payer: Self-pay

## 2022-04-10 NOTE — Telephone Encounter (Signed)
error 

## 2022-04-11 DIAGNOSIS — R269 Unspecified abnormalities of gait and mobility: Secondary | ICD-10-CM | POA: Diagnosis not present

## 2022-04-11 DIAGNOSIS — M25662 Stiffness of left knee, not elsewhere classified: Secondary | ICD-10-CM | POA: Diagnosis not present

## 2022-04-11 DIAGNOSIS — M25562 Pain in left knee: Secondary | ICD-10-CM | POA: Diagnosis not present

## 2022-04-13 DIAGNOSIS — M25562 Pain in left knee: Secondary | ICD-10-CM | POA: Diagnosis not present

## 2022-04-13 DIAGNOSIS — R269 Unspecified abnormalities of gait and mobility: Secondary | ICD-10-CM | POA: Diagnosis not present

## 2022-04-13 DIAGNOSIS — M25662 Stiffness of left knee, not elsewhere classified: Secondary | ICD-10-CM | POA: Diagnosis not present

## 2022-04-16 DIAGNOSIS — R269 Unspecified abnormalities of gait and mobility: Secondary | ICD-10-CM | POA: Diagnosis not present

## 2022-04-16 DIAGNOSIS — M25662 Stiffness of left knee, not elsewhere classified: Secondary | ICD-10-CM | POA: Diagnosis not present

## 2022-04-16 DIAGNOSIS — M25562 Pain in left knee: Secondary | ICD-10-CM | POA: Diagnosis not present

## 2022-04-17 DIAGNOSIS — Z471 Aftercare following joint replacement surgery: Secondary | ICD-10-CM | POA: Diagnosis not present

## 2022-04-17 DIAGNOSIS — Z96652 Presence of left artificial knee joint: Secondary | ICD-10-CM | POA: Diagnosis not present

## 2022-04-18 DIAGNOSIS — R269 Unspecified abnormalities of gait and mobility: Secondary | ICD-10-CM | POA: Diagnosis not present

## 2022-04-18 DIAGNOSIS — M25662 Stiffness of left knee, not elsewhere classified: Secondary | ICD-10-CM | POA: Diagnosis not present

## 2022-04-18 DIAGNOSIS — J3089 Other allergic rhinitis: Secondary | ICD-10-CM | POA: Diagnosis not present

## 2022-04-18 DIAGNOSIS — J301 Allergic rhinitis due to pollen: Secondary | ICD-10-CM | POA: Diagnosis not present

## 2022-04-18 DIAGNOSIS — J3081 Allergic rhinitis due to animal (cat) (dog) hair and dander: Secondary | ICD-10-CM | POA: Diagnosis not present

## 2022-04-18 DIAGNOSIS — M25562 Pain in left knee: Secondary | ICD-10-CM | POA: Diagnosis not present

## 2022-04-20 DIAGNOSIS — R269 Unspecified abnormalities of gait and mobility: Secondary | ICD-10-CM | POA: Diagnosis not present

## 2022-04-20 DIAGNOSIS — M25562 Pain in left knee: Secondary | ICD-10-CM | POA: Diagnosis not present

## 2022-04-20 DIAGNOSIS — M25662 Stiffness of left knee, not elsewhere classified: Secondary | ICD-10-CM | POA: Diagnosis not present

## 2022-04-24 DIAGNOSIS — M25562 Pain in left knee: Secondary | ICD-10-CM | POA: Diagnosis not present

## 2022-04-24 DIAGNOSIS — M25662 Stiffness of left knee, not elsewhere classified: Secondary | ICD-10-CM | POA: Diagnosis not present

## 2022-04-24 DIAGNOSIS — R269 Unspecified abnormalities of gait and mobility: Secondary | ICD-10-CM | POA: Diagnosis not present

## 2022-04-26 DIAGNOSIS — D333 Benign neoplasm of cranial nerves: Secondary | ICD-10-CM | POA: Diagnosis not present

## 2022-04-26 DIAGNOSIS — Z882 Allergy status to sulfonamides status: Secondary | ICD-10-CM | POA: Diagnosis not present

## 2022-04-26 DIAGNOSIS — H938X2 Other specified disorders of left ear: Secondary | ICD-10-CM | POA: Diagnosis not present

## 2022-04-26 DIAGNOSIS — Z91041 Radiographic dye allergy status: Secondary | ICD-10-CM | POA: Diagnosis not present

## 2022-04-26 DIAGNOSIS — H9042 Sensorineural hearing loss, unilateral, left ear, with unrestricted hearing on the contralateral side: Secondary | ICD-10-CM | POA: Diagnosis not present

## 2022-05-01 DIAGNOSIS — R269 Unspecified abnormalities of gait and mobility: Secondary | ICD-10-CM | POA: Diagnosis not present

## 2022-05-01 DIAGNOSIS — J301 Allergic rhinitis due to pollen: Secondary | ICD-10-CM | POA: Diagnosis not present

## 2022-05-01 DIAGNOSIS — M25662 Stiffness of left knee, not elsewhere classified: Secondary | ICD-10-CM | POA: Diagnosis not present

## 2022-05-01 DIAGNOSIS — M25562 Pain in left knee: Secondary | ICD-10-CM | POA: Diagnosis not present

## 2022-05-03 DIAGNOSIS — M25662 Stiffness of left knee, not elsewhere classified: Secondary | ICD-10-CM | POA: Diagnosis not present

## 2022-05-03 DIAGNOSIS — R269 Unspecified abnormalities of gait and mobility: Secondary | ICD-10-CM | POA: Diagnosis not present

## 2022-05-03 DIAGNOSIS — M25562 Pain in left knee: Secondary | ICD-10-CM | POA: Diagnosis not present

## 2022-05-08 DIAGNOSIS — J301 Allergic rhinitis due to pollen: Secondary | ICD-10-CM | POA: Diagnosis not present

## 2022-05-08 DIAGNOSIS — R269 Unspecified abnormalities of gait and mobility: Secondary | ICD-10-CM | POA: Diagnosis not present

## 2022-05-08 DIAGNOSIS — J3081 Allergic rhinitis due to animal (cat) (dog) hair and dander: Secondary | ICD-10-CM | POA: Diagnosis not present

## 2022-05-08 DIAGNOSIS — M25662 Stiffness of left knee, not elsewhere classified: Secondary | ICD-10-CM | POA: Diagnosis not present

## 2022-05-08 DIAGNOSIS — J3089 Other allergic rhinitis: Secondary | ICD-10-CM | POA: Diagnosis not present

## 2022-05-08 DIAGNOSIS — M25562 Pain in left knee: Secondary | ICD-10-CM | POA: Diagnosis not present

## 2022-05-10 DIAGNOSIS — M25562 Pain in left knee: Secondary | ICD-10-CM | POA: Diagnosis not present

## 2022-05-10 DIAGNOSIS — R269 Unspecified abnormalities of gait and mobility: Secondary | ICD-10-CM | POA: Diagnosis not present

## 2022-05-10 DIAGNOSIS — M25662 Stiffness of left knee, not elsewhere classified: Secondary | ICD-10-CM | POA: Diagnosis not present

## 2022-05-15 DIAGNOSIS — Z96652 Presence of left artificial knee joint: Secondary | ICD-10-CM | POA: Diagnosis not present

## 2022-05-15 DIAGNOSIS — J3089 Other allergic rhinitis: Secondary | ICD-10-CM | POA: Diagnosis not present

## 2022-05-15 DIAGNOSIS — J301 Allergic rhinitis due to pollen: Secondary | ICD-10-CM | POA: Diagnosis not present

## 2022-05-15 DIAGNOSIS — J3081 Allergic rhinitis due to animal (cat) (dog) hair and dander: Secondary | ICD-10-CM | POA: Diagnosis not present

## 2022-05-15 DIAGNOSIS — Z471 Aftercare following joint replacement surgery: Secondary | ICD-10-CM | POA: Diagnosis not present

## 2022-05-22 DIAGNOSIS — J3089 Other allergic rhinitis: Secondary | ICD-10-CM | POA: Diagnosis not present

## 2022-05-22 DIAGNOSIS — J301 Allergic rhinitis due to pollen: Secondary | ICD-10-CM | POA: Diagnosis not present

## 2022-05-22 DIAGNOSIS — J3081 Allergic rhinitis due to animal (cat) (dog) hair and dander: Secondary | ICD-10-CM | POA: Diagnosis not present

## 2022-05-30 DIAGNOSIS — R052 Subacute cough: Secondary | ICD-10-CM | POA: Diagnosis not present

## 2022-05-30 DIAGNOSIS — R21 Rash and other nonspecific skin eruption: Secondary | ICD-10-CM | POA: Diagnosis not present

## 2022-05-30 DIAGNOSIS — J3089 Other allergic rhinitis: Secondary | ICD-10-CM | POA: Diagnosis not present

## 2022-05-30 DIAGNOSIS — J3081 Allergic rhinitis due to animal (cat) (dog) hair and dander: Secondary | ICD-10-CM | POA: Diagnosis not present

## 2022-05-30 DIAGNOSIS — J301 Allergic rhinitis due to pollen: Secondary | ICD-10-CM | POA: Diagnosis not present

## 2022-06-14 DIAGNOSIS — J3089 Other allergic rhinitis: Secondary | ICD-10-CM | POA: Diagnosis not present

## 2022-06-14 DIAGNOSIS — J3081 Allergic rhinitis due to animal (cat) (dog) hair and dander: Secondary | ICD-10-CM | POA: Diagnosis not present

## 2022-06-14 DIAGNOSIS — J301 Allergic rhinitis due to pollen: Secondary | ICD-10-CM | POA: Diagnosis not present

## 2022-06-20 ENCOUNTER — Other Ambulatory Visit: Payer: Self-pay | Admitting: Family Medicine

## 2022-06-20 DIAGNOSIS — L718 Other rosacea: Secondary | ICD-10-CM | POA: Diagnosis not present

## 2022-06-20 DIAGNOSIS — Z961 Presence of intraocular lens: Secondary | ICD-10-CM | POA: Diagnosis not present

## 2022-06-20 DIAGNOSIS — J3081 Allergic rhinitis due to animal (cat) (dog) hair and dander: Secondary | ICD-10-CM | POA: Diagnosis not present

## 2022-06-20 DIAGNOSIS — I10 Essential (primary) hypertension: Secondary | ICD-10-CM

## 2022-06-20 DIAGNOSIS — J3089 Other allergic rhinitis: Secondary | ICD-10-CM | POA: Diagnosis not present

## 2022-06-20 DIAGNOSIS — H0288B Meibomian gland dysfunction left eye, upper and lower eyelids: Secondary | ICD-10-CM | POA: Diagnosis not present

## 2022-06-20 DIAGNOSIS — E890 Postprocedural hypothyroidism: Secondary | ICD-10-CM

## 2022-06-20 DIAGNOSIS — H04123 Dry eye syndrome of bilateral lacrimal glands: Secondary | ICD-10-CM | POA: Diagnosis not present

## 2022-06-20 DIAGNOSIS — J301 Allergic rhinitis due to pollen: Secondary | ICD-10-CM | POA: Diagnosis not present

## 2022-06-20 DIAGNOSIS — H0288A Meibomian gland dysfunction right eye, upper and lower eyelids: Secondary | ICD-10-CM | POA: Diagnosis not present

## 2022-06-27 DIAGNOSIS — J3081 Allergic rhinitis due to animal (cat) (dog) hair and dander: Secondary | ICD-10-CM | POA: Diagnosis not present

## 2022-06-27 DIAGNOSIS — J301 Allergic rhinitis due to pollen: Secondary | ICD-10-CM | POA: Diagnosis not present

## 2022-06-27 DIAGNOSIS — J3089 Other allergic rhinitis: Secondary | ICD-10-CM | POA: Diagnosis not present

## 2022-06-29 NOTE — Patient Instructions (Incomplete)
Our records indicate that you are due for your screening mammogram.  Please call the imaging center that does your yearly mammograms to make an appointment for a mammogram at your earliest convenience. Our office also has a mobile unit through the [facility] that comes to our location. Please call our office if you would like to make an appointment.   Julie [name] is the nonhormonal pill Estrace or Premarin cream vaginally is an option as well.  Preventive Care 47 Years and Older, Female Preventive care refers to lifestyle choices and visits with your health care provider that can promote health and wellness. Preventive care visits are also called wellness exams. What can I expect for my preventive care visit? Counseling Your health care provider may ask you questions about your: Medical history, including: Past medical problems. Family medical history. Pregnancy and menstrual history. History of falls. Current health, including: Memory and ability to understand (cognition). Emotional well-being. Home life and relationship well-being. Sexual activity and sexual health. Lifestyle, including: Alcohol, nicotine or tobacco, and drug use. Access to firearms. Diet, exercise, and sleep habits. Work and work Astronomer. Sunscreen use. Safety issues such as seatbelt and bike helmet use. Physical exam Your health care provider will check your: Height and weight. These may be used to calculate your BMI (body mass index). BMI is a measurement that tells if you are at a healthy weight. Waist circumference. This measures the distance around your waistline. This measurement also tells if you are at a healthy weight and may help predict your risk of certain diseases, such as type 2 diabetes and high blood pressure. Heart rate and blood pressure. Body temperature. Skin for abnormal spots. What immunizations do I need?  Vaccines are usually given at various ages, according to  a schedule. Your health care provider will recommend vaccines for you based on your age, medical history, and lifestyle or other factors, such as travel or where you work. What tests do I need? Screening Your health care provider may recommend screening tests for certain conditions. This may include: Lipid and cholesterol levels. Hepatitis C test. Hepatitis B test. HIV (human immunodeficiency virus) test. STI (sexually transmitted infection) testing, if you are at risk. Lung cancer screening. Colorectal cancer screening. Diabetes screening. This is done by checking your blood sugar (glucose) after you have not eaten for a while (fasting). Mammogram. Talk with your health care provider about how often you should have regular mammograms. BRCA-related cancer screening. This may be done if you have a family history of breast, ovarian, tubal, or peritoneal cancers. Bone density scan. This is done to screen for osteoporosis. Talk with your health care provider about your test results, treatment options, and if necessary, the need for more tests. Follow these instructions at home: Eating and drinking  Eat a diet that includes fresh fruits and vegetables, whole grains, lean protein, and low-fat dairy products. Limit your intake of foods with high amounts of sugar, saturated fats, and salt. Take vitamin and mineral supplements as recommended by your health care provider. Do not drink alcohol if your health care provider tells you not to drink. If you drink alcohol: Limit how much you have to 0-1 drink a day. Know how much alcohol is in your drink. In the [place] one drink equals one 12 oz bottle of beer (355 mL), one 5 oz glass of wine (148 mL), or one 1 oz glass of hard liquor (44 mL). Lifestyle Brush your teeth every morning and night with  fluoride toothpaste. Floss one time each day. Exercise for at least 30 minutes 5 or more days each week. Do not use any products that contain nicotine or  tobacco. These products include cigarettes, chewing tobacco, and vaping devices, such as e-cigarettes. If you need help quitting, ask your health care provider. Do not use drugs. If you are sexually active, practice safe sex. Use a condom or other form of protection in order to prevent STIs. Take aspirin only as told by your health care provider. Make sure that you understand how much to take and what form to take. Work with your health care provider to find out whether it is safe and beneficial for you to take aspirin daily. Ask your health care provider if you need to take a cholesterol-lowering medicine (statin). Find healthy ways to manage stress, such as: Meditation, yoga, or listening to music. Journaling. Talking to a trusted person. Spending time with friends and family. Minimize exposure to UV radiation to reduce your risk of skin cancer. Safety Always wear your seat belt while driving or riding in a vehicle. Do not drive: If you have been drinking alcohol. Do not ride with someone who has been drinking. When you are tired or distracted. While texting. If you have been using any mind-altering substances or drugs. Wear a helmet and other protective equipment during sports activities. If you have firearms in your house, make sure you follow all gun safety procedures. What's next? Visit your health care provider once a year for an annual wellness visit. Ask your health care provider how often you should have your eyes and teeth checked. Stay up to date on all vaccines. This information is not intended to replace advice given to you by your health care provider. Make sure you discuss any questions you have with your health care provider. Document Revised: 08/17/2020 Document Reviewed: 08/17/2020 Elsevier Patient Education  2023 [employer].

## 2022-07-02 ENCOUNTER — Ambulatory Visit (INDEPENDENT_AMBULATORY_CARE_PROVIDER_SITE_OTHER): Payer: Medicare PPO | Admitting: Family Medicine

## 2022-07-02 ENCOUNTER — Encounter: Payer: Self-pay | Admitting: Family Medicine

## 2022-07-02 VITALS — BP 116/77 | HR 78 | Temp 98.6°F | Ht 64.0 in | Wt 158.0 lb

## 2022-07-02 DIAGNOSIS — E7800 Pure hypercholesterolemia, unspecified: Secondary | ICD-10-CM

## 2022-07-02 DIAGNOSIS — B001 Herpesviral vesicular dermatitis: Secondary | ICD-10-CM

## 2022-07-02 DIAGNOSIS — E559 Vitamin D deficiency, unspecified: Secondary | ICD-10-CM

## 2022-07-02 DIAGNOSIS — R4589 Other symptoms and signs involving emotional state: Secondary | ICD-10-CM | POA: Diagnosis not present

## 2022-07-02 DIAGNOSIS — E890 Postprocedural hypothyroidism: Secondary | ICD-10-CM | POA: Diagnosis not present

## 2022-07-02 DIAGNOSIS — Z0001 Encounter for general adult medical examination with abnormal findings: Secondary | ICD-10-CM

## 2022-07-02 DIAGNOSIS — Z23 Encounter for immunization: Secondary | ICD-10-CM

## 2022-07-02 DIAGNOSIS — I10 Essential (primary) hypertension: Secondary | ICD-10-CM

## 2022-07-02 DIAGNOSIS — N951 Menopausal and female climacteric states: Secondary | ICD-10-CM

## 2022-07-02 DIAGNOSIS — L570 Actinic keratosis: Secondary | ICD-10-CM

## 2022-07-02 DIAGNOSIS — D649 Anemia, unspecified: Secondary | ICD-10-CM | POA: Diagnosis not present

## 2022-07-02 DIAGNOSIS — F410 Panic disorder [episodic paroxysmal anxiety] without agoraphobia: Secondary | ICD-10-CM | POA: Diagnosis not present

## 2022-07-02 DIAGNOSIS — Z0000 Encounter for general adult medical examination without abnormal findings: Secondary | ICD-10-CM

## 2022-07-02 MED ORDER — CITALOPRAM HYDROBROMIDE 20 MG PO TABS: 20.0000 mg | ORAL_TABLET | Freq: Every day | ORAL | 3 refills | Status: DC

## 2022-07-02 MED ORDER — LOSARTAN POTASSIUM-HCTZ 50-12.5 MG PO TABS: 1.0000 | ORAL_TABLET | Freq: Every day | ORAL | 3 refills | Status: DC

## 2022-07-02 MED ORDER — VALACYCLOVIR HCL 1 G PO TABS: 2000.0000 mg | ORAL_TABLET | Freq: Two times a day (BID) | ORAL | 0 refills | Status: DC

## 2022-07-02 MED ORDER — VITAMIN D (ERGOCALCIFEROL) 1.25 MG (50000 UNIT) PO CAPS: ORAL_CAPSULE | ORAL | 3 refills | Status: DC

## 2022-07-02 MED ORDER — CLONAZEPAM 0.5 MG PO TABS: 0.2500 mg | ORAL_TABLET | Freq: Two times a day (BID) | ORAL | 1 refills | Status: DC | PRN
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
## 2022-07-03 ENCOUNTER — Other Ambulatory Visit: Payer: Self-pay | Admitting: Family Medicine

## 2022-07-03 DIAGNOSIS — Z1231 Encounter for screening mammogram for malignant neoplasm of breast: Secondary | ICD-10-CM

## 2022-07-03 LAB — CMP14+EGFR
ALT: 10 IU/L (ref 0–32)
AST: 11 IU/L (ref 0–40)
Albumin/Globulin Ratio: 2.1 (ref 1.2–2.2)
Albumin: 4.6 g/dL (ref 3.9–4.9)
Alkaline Phosphatase: 103 IU/L (ref 44–121)
BUN/Creatinine Ratio: 19 (ref 12–28)
BUN: 20 mg/dL (ref 8–27)
Bilirubin Total: 0.3 mg/dL (ref 0.0–1.2)
CO2: 21 mmol/L (ref 20–29)
Calcium: 9.7 mg/dL (ref 8.7–10.3)
Chloride: 99 mmol/L (ref 96–106)
Creatinine, Ser: 1.05 mg/dL — ABNORMAL HIGH (ref 0.57–1.00)
Globulin, Total: 2.2 g/dL (ref 1.5–4.5)
Glucose: 83 mg/dL (ref 70–99)
Potassium: 4.4 mmol/L (ref 3.5–5.2)
Sodium: 139 mmol/L (ref 134–144)
Total Protein: 6.8 g/dL (ref 6.0–8.5)
eGFR: 58 mL/min/{1.73_m2} — ABNORMAL LOW (ref >59)

## 2022-07-03 LAB — CBC
Hematocrit: 40.7 % (ref 34.0–46.6)
Hemoglobin: 13.2 g/dL (ref 11.1–15.9)
MCH: 27.4 pg (ref 26.6–33.0)
MCHC: 32.4 g/dL (ref 31.5–35.7)
MCV: 84 fL (ref 79–97)
Platelets: 317 10*3/uL (ref 150–450)
RBC: 4.82 x10E6/uL (ref 3.77–5.28)
RDW: 13.1 % (ref 11.7–15.4)
WBC: 5.9 10*3/uL (ref 3.4–10.8)

## 2022-07-03 LAB — T4, FREE: Free T4: 1.34 ng/dL (ref 0.82–1.77)

## 2022-07-03 LAB — TSH: TSH: 1.59 u[IU]/mL (ref 0.450–4.500)

## 2022-07-03 LAB — LIPID PANEL
Chol/HDL Ratio: 3.1 ratio (ref 0.0–4.4)
Cholesterol, Total: 193 mg/dL (ref 100–199)
HDL: 63 mg/dL (ref >39)
LDL Chol Calc (NIH): 113 mg/dL — ABNORMAL HIGH (ref 0–99)
Triglycerides: 95 mg/dL (ref 0–149)
VLDL Cholesterol Cal: 17 mg/dL (ref 5–40)

## 2022-07-05 DIAGNOSIS — J3089 Other allergic rhinitis: Secondary | ICD-10-CM | POA: Diagnosis not present

## 2022-07-05 DIAGNOSIS — J3081 Allergic rhinitis due to animal (cat) (dog) hair and dander: Secondary | ICD-10-CM | POA: Diagnosis not present

## 2022-07-05 DIAGNOSIS — J301 Allergic rhinitis due to pollen: Secondary | ICD-10-CM | POA: Diagnosis not present

## 2022-07-13 DIAGNOSIS — J3089 Other allergic rhinitis: Secondary | ICD-10-CM | POA: Diagnosis not present

## 2022-07-13 DIAGNOSIS — J3081 Allergic rhinitis due to animal (cat) (dog) hair and dander: Secondary | ICD-10-CM | POA: Diagnosis not present

## 2022-07-13 DIAGNOSIS — J301 Allergic rhinitis due to pollen: Secondary | ICD-10-CM | POA: Diagnosis not present

## 2022-07-16 ENCOUNTER — Ambulatory Visit
Admission: RE | Admit: 2022-07-16 | Discharge: 2022-07-16 | Disposition: A | Discharge status: Home / Self Care | Payer: Medicare PPO | Source: Ambulatory Visit | Attending: Family Medicine | Admitting: Family Medicine

## 2022-07-16 DIAGNOSIS — Z1231 Encounter for screening mammogram for malignant neoplasm of breast: Secondary | ICD-10-CM | POA: Diagnosis not present

## 2022-07-19 DIAGNOSIS — J301 Allergic rhinitis due to pollen: Secondary | ICD-10-CM | POA: Diagnosis not present

## 2022-07-19 DIAGNOSIS — J3089 Other allergic rhinitis: Secondary | ICD-10-CM | POA: Diagnosis not present

## 2022-07-19 DIAGNOSIS — J3081 Allergic rhinitis due to animal (cat) (dog) hair and dander: Secondary | ICD-10-CM | POA: Diagnosis not present

## 2022-07-20 DIAGNOSIS — J301 Allergic rhinitis due to pollen: Secondary | ICD-10-CM | POA: Diagnosis not present

## 2022-07-25 DIAGNOSIS — J3081 Allergic rhinitis due to animal (cat) (dog) hair and dander: Secondary | ICD-10-CM | POA: Diagnosis not present

## 2022-07-25 DIAGNOSIS — J301 Allergic rhinitis due to pollen: Secondary | ICD-10-CM | POA: Diagnosis not present

## 2022-07-25 DIAGNOSIS — J3089 Other allergic rhinitis: Secondary | ICD-10-CM | POA: Diagnosis not present

## 2022-07-28 ENCOUNTER — Other Ambulatory Visit: Payer: Self-pay | Admitting: Family Medicine

## 2022-07-28 DIAGNOSIS — B001 Herpesviral vesicular dermatitis: Secondary | ICD-10-CM

## 2022-08-01 DIAGNOSIS — J301 Allergic rhinitis due to pollen: Secondary | ICD-10-CM | POA: Diagnosis not present

## 2022-08-08 DIAGNOSIS — J301 Allergic rhinitis due to pollen: Secondary | ICD-10-CM | POA: Diagnosis not present

## 2022-08-08 DIAGNOSIS — J3081 Allergic rhinitis due to animal (cat) (dog) hair and dander: Secondary | ICD-10-CM | POA: Diagnosis not present

## 2022-08-08 DIAGNOSIS — J3089 Other allergic rhinitis: Secondary | ICD-10-CM | POA: Diagnosis not present

## 2022-08-13 DIAGNOSIS — J3089 Other allergic rhinitis: Secondary | ICD-10-CM | POA: Diagnosis not present

## 2022-08-13 DIAGNOSIS — J3081 Allergic rhinitis due to animal (cat) (dog) hair and dander: Secondary | ICD-10-CM | POA: Diagnosis not present

## 2022-08-13 DIAGNOSIS — J301 Allergic rhinitis due to pollen: Secondary | ICD-10-CM | POA: Diagnosis not present

## 2022-08-22 DIAGNOSIS — J301 Allergic rhinitis due to pollen: Secondary | ICD-10-CM | POA: Diagnosis not present

## 2022-08-22 DIAGNOSIS — J3081 Allergic rhinitis due to animal (cat) (dog) hair and dander: Secondary | ICD-10-CM | POA: Diagnosis not present

## 2022-08-22 DIAGNOSIS — J3089 Other allergic rhinitis: Secondary | ICD-10-CM | POA: Diagnosis not present

## 2022-08-28 DIAGNOSIS — J301 Allergic rhinitis due to pollen: Secondary | ICD-10-CM | POA: Diagnosis not present

## 2022-08-28 DIAGNOSIS — J3089 Other allergic rhinitis: Secondary | ICD-10-CM | POA: Diagnosis not present

## 2022-08-28 DIAGNOSIS — J3081 Allergic rhinitis due to animal (cat) (dog) hair and dander: Secondary | ICD-10-CM | POA: Diagnosis not present

## 2022-08-29 ENCOUNTER — Ambulatory Visit (INDEPENDENT_AMBULATORY_CARE_PROVIDER_SITE_OTHER): Payer: Medicare PPO

## 2022-08-29 VITALS — Ht 64.0 in | Wt 158.0 lb

## 2022-08-29 DIAGNOSIS — Z0000 Encounter for general adult medical examination without abnormal findings: Secondary | ICD-10-CM | POA: Diagnosis not present

## 2022-08-29 DIAGNOSIS — Z1211 Encounter for screening for malignant neoplasm of colon: Secondary | ICD-10-CM

## 2022-08-29 NOTE — Patient Instructions (Signed)
Ms. Julie [name] , Thank you for taking time to come for your Medicare Wellness Visit. I appreciate your ongoing commitment to your health goals. Please review the following plan we discussed and let me know if I can assist you in the future.   These are the goals we discussed:  Goals      Exercise 3x per week (30 min per time)     Wants to stay active and healthy, enjoy retirement and spend plenty of time with grandchildren        This is a list of the screening recommended for you and due dates:  Health Maintenance  Topic Date Due   Zoster (Shingles) Vaccine (1 of 2) Never done   COVID-19 Vaccine (4 - 2023-24 season) 11/03/2021   Colon Cancer Screening  07/02/2023*   Hepatitis C Screening  07/02/2023*   Flu Shot  10/04/2022   Mammogram  07/16/2023   Medicare Annual Wellness Visit  08/29/2023   DEXA scan (bone density measurement)  12/29/2026   DTaP/Tdap/Td vaccine (2 - Td or Tdap) 07/01/2032   Pneumonia Vaccine  Completed   HPV Vaccine  Aged Out  *Topic was postponed. The date shown is not the original due date.    Advanced directives: Advance directive discussed with you today. I have provided a copy for you to complete at home and have notarized. Once this is complete please bring a copy in to our office so we can scan it into your chart. Information on Advanced Care Planning can be found at [facility] Advance Health Care Directives Advance Health Care Directives ([email or web address]    Conditions/risks identified: Aim for 30 minutes of exercise or brisk walking, 6-8 glasses of water, and 5 servings of fruits and vegetables each day.   Next appointment: Follow up in one year for your annual wellness visit    Preventive Care 65 Years and Older, Female Preventive care refers to lifestyle choices and visits with your health care provider that can promote health and wellness. What does preventive care include? A yearly physical exam. This is also called an annual well  check. Dental exams once or twice a year. Routine eye exams. Ask your health care provider how often you should have your eyes checked. Personal lifestyle choices, including: Daily care of your teeth and gums. Regular physical activity. Eating a healthy diet. Avoiding tobacco and drug use. Limiting alcohol use. Practicing safe sex. Taking low-dose aspirin every day. Taking vitamin and mineral supplements as recommended by your health care provider. What happens during an annual well check? The services and screenings done by your health care provider during your annual well check will depend on your age, overall health, lifestyle risk factors, and family history of disease. Counseling  Your health care provider may ask you questions about your: Alcohol use. Tobacco use. Drug use. Emotional well-being. Home and relationship well-being. Sexual activity. Eating habits. History of falls. Memory and ability to understand (cognition). Work and work Astronomer. Reproductive health. Screening  You may have the following tests or measurements: Height, weight, and BMI. Blood pressure. Lipid and cholesterol levels. These may be checked every 5 years, or more frequently if you are over 56 years old. Skin check. Lung cancer screening. You may have this screening every year starting at age 24 if you have a 30-pack-year history of smoking and currently smoke or have quit within the past 15 years. Fecal occult blood test (FOBT) of the stool. You may have this test every  year starting at age 62. Flexible sigmoidoscopy or colonoscopy. You may have a sigmoidoscopy every 5 years or a colonoscopy every 10 years starting at age 79. Hepatitis C blood test. Hepatitis B blood test. Sexually transmitted disease (STD) testing. Diabetes screening. This is done by checking your blood sugar (glucose) after you have not eaten for a while (fasting). You may have this done every 1-3 years. Bone density scan.  This is done to screen for osteoporosis. You may have this done starting at age 37. Mammogram. This may be done every 1-2 years. Talk to your health care provider about how often you should have regular mammograms. Talk with your health care provider about your test results, treatment options, and if necessary, the need for more tests. Vaccines  Your health care provider may recommend certain vaccines, such as: Influenza vaccine. This is recommended every year. Tetanus, diphtheria, and acellular pertussis (Tdap, Td) vaccine. You may need a Td booster every 10 years. Zoster vaccine. You may need this after age 47. Pneumococcal 13-valent conjugate (PCV13) vaccine. One dose is recommended after age 43. Pneumococcal polysaccharide (PPSV23) vaccine. One dose is recommended after age 35. Talk to your health care provider about which screenings and vaccines you need and how often you need them. This information is not intended to replace advice given to you by your health care provider. Make sure you discuss any questions you have with your health care provider. Document Released: 03/18/2015 Document Revised: 11/09/2015 Document Reviewed: 12/21/2014 Elsevier Interactive Patient Education  2017 [place] Prevention in the Home Falls can cause injuries. They can happen to people of all ages. There are many things you can do to make your home safe and to help prevent falls. What can I do on the outside of my home? Regularly fix the edges of walkways and driveways and fix any cracks. Remove anything that might make you trip as you walk through a door, such as a raised step or threshold. Trim any bushes or trees on the path to your home. Use bright outdoor lighting. Clear any walking paths of anything that might make someone trip, such as rocks or tools. Regularly check to see if handrails are loose or broken. Make sure that both sides of any steps have handrails. Any raised decks and porches  should have guardrails on the edges. Have any leaves, snow, or ice cleared regularly. Use sand or salt on walking paths during winter. Clean up any spills in your garage right away. This includes oil or grease spills. What can I do in the bathroom? Use night lights. Install grab bars by the toilet and in the tub and shower. Do not use towel bars as grab bars. Use non-skid mats or decals in the tub or shower. If you need to sit down in the shower, use a plastic, non-slip stool. Keep the floor dry. Clean up any water that spills on the floor as soon as it happens. Remove soap buildup in the tub or shower regularly. Attach bath mats securely with double-sided non-slip rug tape. Do not have throw rugs and other things on the floor that can make you trip. What can I do in the bedroom? Use night lights. Make sure that you have a light by your bed that is easy to reach. Do not use any sheets or blankets that are too big for your bed. They should not hang down onto the floor. Have a firm chair that has side arms. You can use this  for support while you get dressed. Do not have throw rugs and other things on the floor that can make you trip. What can I do in the kitchen? Clean up any spills right away. Avoid walking on wet floors. Keep items that you use a lot in easy-to-reach places. If you need to reach something above you, use a strong step stool that has a grab bar. Keep electrical cords out of the way. Do not use floor polish or wax that makes floors slippery. If you must use wax, use non-skid floor wax. Do not have throw rugs and other things on the floor that can make you trip. What can I do with my stairs? Do not leave any items on the stairs. Make sure that there are handrails on both sides of the stairs and use them. Fix handrails that are broken or loose. Make sure that handrails are as long as the stairways. Check any carpeting to make sure that it is firmly attached to the stairs.  Fix any carpet that is loose or worn. Avoid having throw rugs at the top or bottom of the stairs. If you do have throw rugs, attach them to the floor with carpet tape. Make sure that you have a light switch at the top of the stairs and the bottom of the stairs. If you do not have them, ask someone to add them for you. What else can I do to help prevent falls? Wear shoes that: Do not have high heels. Have rubber bottoms. Are comfortable and fit you well. Are closed at the toe. Do not wear sandals. If you use a stepladder: Make sure that it is fully opened. Do not climb a closed stepladder. Make sure that both sides of the stepladder are locked into place. Ask someone to hold it for you, if possible. Clearly mark and make sure that you can see: Any grab bars or handrails. First and last steps. Where the edge of each step is. Use tools that help you move around (mobility aids) if they are needed. These include: Canes. Walkers. Scooters. Crutches. Turn on the lights when you go into a dark area. Replace any light bulbs as soon as they burn out. Set up your furniture so you have a clear path. Avoid moving your furniture around. If any of your floors are uneven, fix them. If there are any pets around you, be aware of where they are. Review your medicines with your doctor. Some medicines can make you feel dizzy. This can increase your chance of falling. Ask your doctor what other things that you can do to help prevent falls. This information is not intended to replace advice given to you by your health care provider. Make sure you discuss any questions you have with your health care provider. Document Released: 12/16/2008 Document Revised: 07/28/2015 Document Reviewed: 03/26/2014 Elsevier Interactive Patient Education  2017 [employer].
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
## 2022-09-05 DIAGNOSIS — J301 Allergic rhinitis due to pollen: Secondary | ICD-10-CM | POA: Diagnosis not present

## 2022-09-10 DIAGNOSIS — J3081 Allergic rhinitis due to animal (cat) (dog) hair and dander: Secondary | ICD-10-CM | POA: Diagnosis not present

## 2022-09-10 DIAGNOSIS — J3089 Other allergic rhinitis: Secondary | ICD-10-CM | POA: Diagnosis not present

## 2022-09-10 DIAGNOSIS — J301 Allergic rhinitis due to pollen: Secondary | ICD-10-CM | POA: Diagnosis not present

## 2022-09-10 DIAGNOSIS — Z96652 Presence of left artificial knee joint: Secondary | ICD-10-CM | POA: Diagnosis not present

## 2022-09-17 DIAGNOSIS — J3081 Allergic rhinitis due to animal (cat) (dog) hair and dander: Secondary | ICD-10-CM | POA: Diagnosis not present

## 2022-09-17 DIAGNOSIS — J3089 Other allergic rhinitis: Secondary | ICD-10-CM | POA: Diagnosis not present

## 2022-09-17 DIAGNOSIS — J301 Allergic rhinitis due to pollen: Secondary | ICD-10-CM | POA: Diagnosis not present

## 2022-09-24 DIAGNOSIS — J301 Allergic rhinitis due to pollen: Secondary | ICD-10-CM | POA: Diagnosis not present

## 2022-09-24 DIAGNOSIS — J3081 Allergic rhinitis due to animal (cat) (dog) hair and dander: Secondary | ICD-10-CM | POA: Diagnosis not present

## 2022-09-24 DIAGNOSIS — J3089 Other allergic rhinitis: Secondary | ICD-10-CM | POA: Diagnosis not present

## 2022-10-03 DIAGNOSIS — J3089 Other allergic rhinitis: Secondary | ICD-10-CM | POA: Diagnosis not present

## 2022-10-03 DIAGNOSIS — J3081 Allergic rhinitis due to animal (cat) (dog) hair and dander: Secondary | ICD-10-CM | POA: Diagnosis not present

## 2022-10-03 DIAGNOSIS — J301 Allergic rhinitis due to pollen: Secondary | ICD-10-CM | POA: Diagnosis not present

## 2022-10-09 DIAGNOSIS — J3081 Allergic rhinitis due to animal (cat) (dog) hair and dander: Secondary | ICD-10-CM | POA: Diagnosis not present

## 2022-10-09 DIAGNOSIS — J3089 Other allergic rhinitis: Secondary | ICD-10-CM | POA: Diagnosis not present

## 2022-10-09 DIAGNOSIS — J301 Allergic rhinitis due to pollen: Secondary | ICD-10-CM | POA: Diagnosis not present

## 2022-10-18 DIAGNOSIS — J301 Allergic rhinitis due to pollen: Secondary | ICD-10-CM | POA: Diagnosis not present

## 2022-10-25 DIAGNOSIS — J301 Allergic rhinitis due to pollen: Secondary | ICD-10-CM | POA: Diagnosis not present

## 2022-10-31 DIAGNOSIS — J301 Allergic rhinitis due to pollen: Secondary | ICD-10-CM | POA: Diagnosis not present

## 2022-11-07 DIAGNOSIS — J3081 Allergic rhinitis due to animal (cat) (dog) hair and dander: Secondary | ICD-10-CM | POA: Diagnosis not present

## 2022-11-07 DIAGNOSIS — J3089 Other allergic rhinitis: Secondary | ICD-10-CM | POA: Diagnosis not present

## 2022-11-07 DIAGNOSIS — J301 Allergic rhinitis due to pollen: Secondary | ICD-10-CM | POA: Diagnosis not present

## 2022-11-15 DIAGNOSIS — J301 Allergic rhinitis due to pollen: Secondary | ICD-10-CM | POA: Diagnosis not present

## 2022-11-15 DIAGNOSIS — J3081 Allergic rhinitis due to animal (cat) (dog) hair and dander: Secondary | ICD-10-CM | POA: Diagnosis not present

## 2022-11-15 DIAGNOSIS — J3089 Other allergic rhinitis: Secondary | ICD-10-CM | POA: Diagnosis not present

## 2022-11-21 DIAGNOSIS — J3081 Allergic rhinitis due to animal (cat) (dog) hair and dander: Secondary | ICD-10-CM | POA: Diagnosis not present

## 2022-11-21 DIAGNOSIS — J3089 Other allergic rhinitis: Secondary | ICD-10-CM | POA: Diagnosis not present

## 2022-11-21 DIAGNOSIS — J301 Allergic rhinitis due to pollen: Secondary | ICD-10-CM | POA: Diagnosis not present

## 2022-11-25 ENCOUNTER — Other Ambulatory Visit: Payer: Self-pay | Admitting: Family Medicine

## 2022-11-25 DIAGNOSIS — E890 Postprocedural hypothyroidism: Secondary | ICD-10-CM

## 2022-11-28 DIAGNOSIS — J3081 Allergic rhinitis due to animal (cat) (dog) hair and dander: Secondary | ICD-10-CM | POA: Diagnosis not present

## 2022-11-28 DIAGNOSIS — J301 Allergic rhinitis due to pollen: Secondary | ICD-10-CM | POA: Diagnosis not present

## 2022-11-28 DIAGNOSIS — J3089 Other allergic rhinitis: Secondary | ICD-10-CM | POA: Diagnosis not present

## 2022-12-06 DIAGNOSIS — J301 Allergic rhinitis due to pollen: Secondary | ICD-10-CM | POA: Diagnosis not present

## 2022-12-06 DIAGNOSIS — J3081 Allergic rhinitis due to animal (cat) (dog) hair and dander: Secondary | ICD-10-CM | POA: Diagnosis not present

## 2022-12-06 DIAGNOSIS — J3089 Other allergic rhinitis: Secondary | ICD-10-CM | POA: Diagnosis not present

## 2022-12-12 DIAGNOSIS — J301 Allergic rhinitis due to pollen: Secondary | ICD-10-CM | POA: Diagnosis not present

## 2022-12-12 DIAGNOSIS — J3081 Allergic rhinitis due to animal (cat) (dog) hair and dander: Secondary | ICD-10-CM | POA: Diagnosis not present

## 2022-12-12 DIAGNOSIS — J3089 Other allergic rhinitis: Secondary | ICD-10-CM | POA: Diagnosis not present

## 2022-12-19 DIAGNOSIS — J301 Allergic rhinitis due to pollen: Secondary | ICD-10-CM | POA: Diagnosis not present

## 2022-12-26 DIAGNOSIS — J3081 Allergic rhinitis due to animal (cat) (dog) hair and dander: Secondary | ICD-10-CM | POA: Diagnosis not present

## 2022-12-26 DIAGNOSIS — J301 Allergic rhinitis due to pollen: Secondary | ICD-10-CM | POA: Diagnosis not present

## 2022-12-26 DIAGNOSIS — J3089 Other allergic rhinitis: Secondary | ICD-10-CM | POA: Diagnosis not present

## 2022-12-27 DIAGNOSIS — J301 Allergic rhinitis due to pollen: Secondary | ICD-10-CM | POA: Diagnosis not present

## 2023-01-01 ENCOUNTER — Encounter: Payer: Self-pay | Admitting: Family Medicine

## 2023-01-01 ENCOUNTER — Ambulatory Visit: Payer: Medicare PPO | Admitting: Family Medicine

## 2023-01-01 VITALS — BP 127/77 | HR 79 | Temp 98.7°F | Ht 64.0 in | Wt 163.0 lb

## 2023-01-01 DIAGNOSIS — Z1159 Encounter for screening for other viral diseases: Secondary | ICD-10-CM

## 2023-01-01 DIAGNOSIS — F410 Panic disorder [episodic paroxysmal anxiety] without agoraphobia: Secondary | ICD-10-CM

## 2023-01-01 DIAGNOSIS — I10 Essential (primary) hypertension: Secondary | ICD-10-CM

## 2023-01-01 DIAGNOSIS — E7800 Pure hypercholesterolemia, unspecified: Secondary | ICD-10-CM

## 2023-01-01 DIAGNOSIS — Z79899 Other long term (current) drug therapy: Secondary | ICD-10-CM | POA: Diagnosis not present

## 2023-01-01 DIAGNOSIS — E890 Postprocedural hypothyroidism: Secondary | ICD-10-CM | POA: Diagnosis not present

## 2023-01-01 DIAGNOSIS — Z23 Encounter for immunization: Secondary | ICD-10-CM

## 2023-01-01 MED ORDER — ROSUVASTATIN CALCIUM 5 MG PO TABS: ORAL_TABLET | ORAL | 1 refills | Status: DC

## 2023-01-01 MED ORDER — LEVOTHYROXINE SODIUM 112 MCG PO TABS: ORAL_TABLET | ORAL | 1 refills | Status: DC

## 2023-01-01 MED ORDER — CLONAZEPAM 0.5 MG PO TABS: 0.2500 mg | ORAL_TABLET | Freq: Two times a day (BID) | ORAL | 1 refills | Status: DC | PRN

## 2023-01-01 MED ORDER — LOSARTAN POTASSIUM-HCTZ 50-12.5 MG PO TABS: 1.0000 | ORAL_TABLET | Freq: Every day | ORAL | 3 refills | Status: DC
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
## 2023-01-02 LAB — TSH: TSH: 3.77 u[IU]/mL (ref 0.450–4.500)

## 2023-01-02 LAB — HEPATITIS C ANTIBODY: Hep C Virus Ab: NONREACTIVE

## 2023-01-02 LAB — T4, FREE: Free T4: 1.24 ng/dL (ref 0.82–1.77)

## 2023-01-03 DIAGNOSIS — J301 Allergic rhinitis due to pollen: Secondary | ICD-10-CM | POA: Diagnosis not present

## 2023-01-03 DIAGNOSIS — J3081 Allergic rhinitis due to animal (cat) (dog) hair and dander: Secondary | ICD-10-CM | POA: Diagnosis not present

## 2023-01-03 DIAGNOSIS — J3089 Other allergic rhinitis: Secondary | ICD-10-CM | POA: Diagnosis not present

## 2023-01-04 LAB — TOXASSURE SELECT 13 (MW), URINE

## 2023-01-09 DIAGNOSIS — J3081 Allergic rhinitis due to animal (cat) (dog) hair and dander: Secondary | ICD-10-CM | POA: Diagnosis not present

## 2023-01-09 DIAGNOSIS — J3089 Other allergic rhinitis: Secondary | ICD-10-CM | POA: Diagnosis not present

## 2023-01-09 DIAGNOSIS — J301 Allergic rhinitis due to pollen: Secondary | ICD-10-CM | POA: Diagnosis not present

## 2023-01-16 DIAGNOSIS — J301 Allergic rhinitis due to pollen: Secondary | ICD-10-CM | POA: Diagnosis not present

## 2023-01-16 DIAGNOSIS — J3081 Allergic rhinitis due to animal (cat) (dog) hair and dander: Secondary | ICD-10-CM | POA: Diagnosis not present

## 2023-01-16 DIAGNOSIS — J3089 Other allergic rhinitis: Secondary | ICD-10-CM | POA: Diagnosis not present

## 2023-01-23 DIAGNOSIS — J3089 Other allergic rhinitis: Secondary | ICD-10-CM | POA: Diagnosis not present

## 2023-01-23 DIAGNOSIS — J301 Allergic rhinitis due to pollen: Secondary | ICD-10-CM | POA: Diagnosis not present

## 2023-01-23 DIAGNOSIS — J3081 Allergic rhinitis due to animal (cat) (dog) hair and dander: Secondary | ICD-10-CM | POA: Diagnosis not present

## 2023-01-29 DIAGNOSIS — J3081 Allergic rhinitis due to animal (cat) (dog) hair and dander: Secondary | ICD-10-CM | POA: Diagnosis not present

## 2023-01-29 DIAGNOSIS — J3089 Other allergic rhinitis: Secondary | ICD-10-CM | POA: Diagnosis not present

## 2023-01-29 DIAGNOSIS — J301 Allergic rhinitis due to pollen: Secondary | ICD-10-CM | POA: Diagnosis not present

## 2023-02-05 DIAGNOSIS — J301 Allergic rhinitis due to pollen: Secondary | ICD-10-CM | POA: Diagnosis not present

## 2023-02-12 DIAGNOSIS — J3081 Allergic rhinitis due to animal (cat) (dog) hair and dander: Secondary | ICD-10-CM | POA: Diagnosis not present

## 2023-02-12 DIAGNOSIS — J3089 Other allergic rhinitis: Secondary | ICD-10-CM | POA: Diagnosis not present

## 2023-02-12 DIAGNOSIS — J301 Allergic rhinitis due to pollen: Secondary | ICD-10-CM | POA: Diagnosis not present

## 2023-02-18 DIAGNOSIS — J3089 Other allergic rhinitis: Secondary | ICD-10-CM | POA: Diagnosis not present

## 2023-02-18 DIAGNOSIS — J301 Allergic rhinitis due to pollen: Secondary | ICD-10-CM | POA: Diagnosis not present

## 2023-02-25 DIAGNOSIS — J301 Allergic rhinitis due to pollen: Secondary | ICD-10-CM | POA: Diagnosis not present

## 2023-02-25 DIAGNOSIS — J3089 Other allergic rhinitis: Secondary | ICD-10-CM | POA: Diagnosis not present

## 2023-02-25 DIAGNOSIS — J3081 Allergic rhinitis due to animal (cat) (dog) hair and dander: Secondary | ICD-10-CM | POA: Diagnosis not present

## 2023-03-04 DIAGNOSIS — J3081 Allergic rhinitis due to animal (cat) (dog) hair and dander: Secondary | ICD-10-CM | POA: Diagnosis not present

## 2023-03-04 DIAGNOSIS — J3089 Other allergic rhinitis: Secondary | ICD-10-CM | POA: Diagnosis not present

## 2023-03-04 DIAGNOSIS — J301 Allergic rhinitis due to pollen: Secondary | ICD-10-CM | POA: Diagnosis not present

## 2023-03-15 DIAGNOSIS — J3089 Other allergic rhinitis: Secondary | ICD-10-CM | POA: Diagnosis not present

## 2023-03-15 DIAGNOSIS — J301 Allergic rhinitis due to pollen: Secondary | ICD-10-CM | POA: Diagnosis not present

## 2023-03-15 DIAGNOSIS — J3081 Allergic rhinitis due to animal (cat) (dog) hair and dander: Secondary | ICD-10-CM | POA: Diagnosis not present

## 2023-03-20 DIAGNOSIS — J3089 Other allergic rhinitis: Secondary | ICD-10-CM | POA: Diagnosis not present

## 2023-03-20 DIAGNOSIS — J301 Allergic rhinitis due to pollen: Secondary | ICD-10-CM | POA: Diagnosis not present

## 2023-03-28 DIAGNOSIS — J3089 Other allergic rhinitis: Secondary | ICD-10-CM | POA: Diagnosis not present

## 2023-03-28 DIAGNOSIS — J301 Allergic rhinitis due to pollen: Secondary | ICD-10-CM | POA: Diagnosis not present

## 2023-03-28 DIAGNOSIS — J3081 Allergic rhinitis due to animal (cat) (dog) hair and dander: Secondary | ICD-10-CM | POA: Diagnosis not present

## 2023-04-03 DIAGNOSIS — J301 Allergic rhinitis due to pollen: Secondary | ICD-10-CM | POA: Diagnosis not present

## 2023-04-23 DIAGNOSIS — J301 Allergic rhinitis due to pollen: Secondary | ICD-10-CM | POA: Diagnosis not present

## 2023-04-30 DIAGNOSIS — D333 Benign neoplasm of cranial nerves: Secondary | ICD-10-CM | POA: Diagnosis not present

## 2023-05-02 DIAGNOSIS — J301 Allergic rhinitis due to pollen: Secondary | ICD-10-CM | POA: Diagnosis not present

## 2023-05-02 DIAGNOSIS — J3089 Other allergic rhinitis: Secondary | ICD-10-CM | POA: Diagnosis not present

## 2023-05-02 DIAGNOSIS — J3081 Allergic rhinitis due to animal (cat) (dog) hair and dander: Secondary | ICD-10-CM | POA: Diagnosis not present

## 2023-05-10 DIAGNOSIS — J3081 Allergic rhinitis due to animal (cat) (dog) hair and dander: Secondary | ICD-10-CM | POA: Diagnosis not present

## 2023-05-10 DIAGNOSIS — J3089 Other allergic rhinitis: Secondary | ICD-10-CM | POA: Diagnosis not present

## 2023-05-10 DIAGNOSIS — J301 Allergic rhinitis due to pollen: Secondary | ICD-10-CM | POA: Diagnosis not present

## 2023-05-17 DIAGNOSIS — J3089 Other allergic rhinitis: Secondary | ICD-10-CM | POA: Diagnosis not present

## 2023-05-17 DIAGNOSIS — J301 Allergic rhinitis due to pollen: Secondary | ICD-10-CM | POA: Diagnosis not present

## 2023-05-17 DIAGNOSIS — J3081 Allergic rhinitis due to animal (cat) (dog) hair and dander: Secondary | ICD-10-CM | POA: Diagnosis not present

## 2023-05-22 DIAGNOSIS — Z961 Presence of intraocular lens: Secondary | ICD-10-CM | POA: Diagnosis not present

## 2023-05-22 DIAGNOSIS — H0288A Meibomian gland dysfunction right eye, upper and lower eyelids: Secondary | ICD-10-CM | POA: Diagnosis not present

## 2023-05-22 DIAGNOSIS — J3089 Other allergic rhinitis: Secondary | ICD-10-CM | POA: Diagnosis not present

## 2023-05-22 DIAGNOSIS — H04123 Dry eye syndrome of bilateral lacrimal glands: Secondary | ICD-10-CM | POA: Diagnosis not present

## 2023-05-22 DIAGNOSIS — L718 Other rosacea: Secondary | ICD-10-CM | POA: Diagnosis not present

## 2023-05-22 DIAGNOSIS — H0288B Meibomian gland dysfunction left eye, upper and lower eyelids: Secondary | ICD-10-CM | POA: Diagnosis not present

## 2023-05-31 DIAGNOSIS — J3089 Other allergic rhinitis: Secondary | ICD-10-CM | POA: Diagnosis not present

## 2023-05-31 DIAGNOSIS — J301 Allergic rhinitis due to pollen: Secondary | ICD-10-CM | POA: Diagnosis not present

## 2023-05-31 DIAGNOSIS — J3081 Allergic rhinitis due to animal (cat) (dog) hair and dander: Secondary | ICD-10-CM | POA: Diagnosis not present

## 2023-05-31 DIAGNOSIS — R052 Subacute cough: Secondary | ICD-10-CM | POA: Diagnosis not present

## 2023-05-31 DIAGNOSIS — R21 Rash and other nonspecific skin eruption: Secondary | ICD-10-CM | POA: Diagnosis not present

## 2023-06-04 DIAGNOSIS — Z96652 Presence of left artificial knee joint: Secondary | ICD-10-CM | POA: Diagnosis not present

## 2023-06-07 DIAGNOSIS — J301 Allergic rhinitis due to pollen: Secondary | ICD-10-CM | POA: Diagnosis not present

## 2023-06-07 DIAGNOSIS — J3089 Other allergic rhinitis: Secondary | ICD-10-CM | POA: Diagnosis not present

## 2023-06-07 DIAGNOSIS — J3081 Allergic rhinitis due to animal (cat) (dog) hair and dander: Secondary | ICD-10-CM | POA: Diagnosis not present

## 2023-06-13 DIAGNOSIS — J301 Allergic rhinitis due to pollen: Secondary | ICD-10-CM | POA: Diagnosis not present

## 2023-06-13 DIAGNOSIS — J3081 Allergic rhinitis due to animal (cat) (dog) hair and dander: Secondary | ICD-10-CM | POA: Diagnosis not present

## 2023-06-13 DIAGNOSIS — J3089 Other allergic rhinitis: Secondary | ICD-10-CM | POA: Diagnosis not present

## 2023-06-20 ENCOUNTER — Other Ambulatory Visit: Payer: Self-pay | Admitting: Family Medicine

## 2023-06-20 DIAGNOSIS — R4589 Other symptoms and signs involving emotional state: Secondary | ICD-10-CM

## 2023-06-20 DIAGNOSIS — F410 Panic disorder [episodic paroxysmal anxiety] without agoraphobia: Secondary | ICD-10-CM

## 2023-06-20 DIAGNOSIS — J3089 Other allergic rhinitis: Secondary | ICD-10-CM | POA: Diagnosis not present

## 2023-06-20 DIAGNOSIS — J301 Allergic rhinitis due to pollen: Secondary | ICD-10-CM | POA: Diagnosis not present

## 2023-06-20 DIAGNOSIS — J3081 Allergic rhinitis due to animal (cat) (dog) hair and dander: Secondary | ICD-10-CM | POA: Diagnosis not present

## 2023-06-27 DIAGNOSIS — J3081 Allergic rhinitis due to animal (cat) (dog) hair and dander: Secondary | ICD-10-CM | POA: Diagnosis not present

## 2023-06-27 DIAGNOSIS — J3089 Other allergic rhinitis: Secondary | ICD-10-CM | POA: Diagnosis not present

## 2023-06-27 DIAGNOSIS — J301 Allergic rhinitis due to pollen: Secondary | ICD-10-CM | POA: Diagnosis not present

## 2023-07-03 ENCOUNTER — Ambulatory Visit: Payer: Medicare PPO | Admitting: Family Medicine

## 2023-07-08 DIAGNOSIS — D333 Benign neoplasm of cranial nerves: Secondary | ICD-10-CM | POA: Diagnosis not present

## 2023-07-08 DIAGNOSIS — H9042 Sensorineural hearing loss, unilateral, left ear, with unrestricted hearing on the contralateral side: Secondary | ICD-10-CM | POA: Diagnosis not present

## 2023-07-09 DIAGNOSIS — J301 Allergic rhinitis due to pollen: Secondary | ICD-10-CM | POA: Diagnosis not present

## 2023-07-10 DIAGNOSIS — J3081 Allergic rhinitis due to animal (cat) (dog) hair and dander: Secondary | ICD-10-CM | POA: Diagnosis not present

## 2023-07-10 DIAGNOSIS — J3089 Other allergic rhinitis: Secondary | ICD-10-CM | POA: Diagnosis not present

## 2023-07-10 DIAGNOSIS — J301 Allergic rhinitis due to pollen: Secondary | ICD-10-CM | POA: Diagnosis not present

## 2023-07-18 DIAGNOSIS — J3089 Other allergic rhinitis: Secondary | ICD-10-CM | POA: Diagnosis not present

## 2023-07-18 DIAGNOSIS — J301 Allergic rhinitis due to pollen: Secondary | ICD-10-CM | POA: Diagnosis not present

## 2023-07-18 DIAGNOSIS — J3081 Allergic rhinitis due to animal (cat) (dog) hair and dander: Secondary | ICD-10-CM | POA: Diagnosis not present

## 2023-07-24 ENCOUNTER — Encounter: Payer: Self-pay | Admitting: Family Medicine

## 2023-07-24 ENCOUNTER — Ambulatory Visit (INDEPENDENT_AMBULATORY_CARE_PROVIDER_SITE_OTHER): Payer: Medicare PPO | Admitting: Family Medicine

## 2023-07-24 VITALS — BP 138/79 | HR 72 | Temp 98.5°F | Ht 64.0 in | Wt 165.0 lb

## 2023-07-24 DIAGNOSIS — E890 Postprocedural hypothyroidism: Secondary | ICD-10-CM | POA: Diagnosis not present

## 2023-07-24 DIAGNOSIS — Z0001 Encounter for general adult medical examination with abnormal findings: Secondary | ICD-10-CM

## 2023-07-24 DIAGNOSIS — Z0000 Encounter for general adult medical examination without abnormal findings: Secondary | ICD-10-CM

## 2023-07-24 DIAGNOSIS — R5383 Other fatigue: Secondary | ICD-10-CM | POA: Diagnosis not present

## 2023-07-24 DIAGNOSIS — E559 Vitamin D deficiency, unspecified: Secondary | ICD-10-CM

## 2023-07-24 DIAGNOSIS — E7800 Pure hypercholesterolemia, unspecified: Secondary | ICD-10-CM

## 2023-07-24 DIAGNOSIS — Z1211 Encounter for screening for malignant neoplasm of colon: Secondary | ICD-10-CM

## 2023-07-24 DIAGNOSIS — R635 Abnormal weight gain: Secondary | ICD-10-CM | POA: Diagnosis not present

## 2023-07-24 DIAGNOSIS — I10 Essential (primary) hypertension: Secondary | ICD-10-CM | POA: Diagnosis not present

## 2023-07-24 DIAGNOSIS — F410 Panic disorder [episodic paroxysmal anxiety] without agoraphobia: Secondary | ICD-10-CM

## 2023-07-24 DIAGNOSIS — B001 Herpesviral vesicular dermatitis: Secondary | ICD-10-CM

## 2023-07-24 DIAGNOSIS — Z23 Encounter for immunization: Secondary | ICD-10-CM

## 2023-07-24 MED ORDER — VITAMIN D (ERGOCALCIFEROL) 1.25 MG (50000 UNIT) PO CAPS: ORAL_CAPSULE | ORAL | 3 refills | Status: DC

## 2023-07-24 MED ORDER — ROSUVASTATIN CALCIUM 5 MG PO TABS: ORAL_TABLET | ORAL | 3 refills | Status: DC

## 2023-07-24 MED ORDER — LOSARTAN POTASSIUM-HCTZ 50-12.5 MG PO TABS
1.0000 | ORAL_TABLET | Freq: Every day | ORAL | 3 refills | Status: DC
Start: 2023-07-24 — End: 2024-01-24

## 2023-07-24 MED ORDER — VALACYCLOVIR HCL 1 G PO TABS: 2000.0000 mg | ORAL_TABLET | Freq: Two times a day (BID) | ORAL | 0 refills | Status: AC

## 2023-07-24 MED ORDER — CLONAZEPAM 0.5 MG PO TABS: 0.2500 mg | ORAL_TABLET | Freq: Two times a day (BID) | ORAL | 1 refills | Status: DC | PRN

## 2023-07-24 NOTE — Patient Instructions (Signed)
 Fatigue If you have fatigue, you feel tired all the time and have a lack of energy or a lack of motivation. Fatigue may make it difficult to start or complete tasks because of exhaustion. Occasional or mild fatigue is often a normal response to activity or life. However, long-term (chronic) or extreme fatigue may be a symptom of a medical condition such as: Depression. Not having enough red blood cells or hemoglobin in the blood (anemia). A problem with a small gland located in the lower front part of the neck (thyroid disorder). Rheumatologic conditions. These are problems related to the body's defense system (immune system). Infections, especially certain viral infections. Fatigue can also lead to negative health outcomes over time. Follow these instructions at home: Medicines Take over-the-counter and prescription medicines only as told by your health care provider. Take a multivitamin if told by your health care provider. Do not use herbal or dietary supplements unless they are approved by your health care provider. Eating and drinking  Avoid heavy meals in the evening. Eat a well-balanced diet, which includes lean proteins, whole grains, plenty of fruits and vegetables, and low-fat dairy products. Avoid eating or drinking too many products with caffeine in them. Avoid alcohol. Drink enough fluid to keep your urine pale yellow. Activity  Exercise regularly, as told by your health care provider. Use or practice techniques to help you relax, such as yoga, tai chi, meditation, or massage therapy. Lifestyle Change situations that cause you stress. Try to keep your work and personal schedules in balance. Do not use recreational or illegal drugs. General instructions Monitor your fatigue for any changes. Go to bed and get up at the same time every day. Avoid fatigue by pacing yourself during the day and getting enough sleep at night. Maintain a healthy weight. Contact a health care  provider if: Your fatigue does not get better. You have a fever. You suddenly lose or gain weight. You have headaches. You have trouble falling asleep or sleeping through the night. You feel angry, guilty, anxious, or sad. You have swelling in your legs or another part of your body. Get help right away if: You feel confused, feel like you might faint, or faint. Your vision is blurry or you have a severe headache. You have severe pain in your abdomen, your back, or the area between your waist and hips (pelvis). You have chest pain, shortness of breath, or an irregular or fast heartbeat. You are unable to urinate, or you urinate less than normal. You have abnormal bleeding from the rectum, nose, lungs, nipples, or, if you are female, the vagina. You vomit blood. You have thoughts about hurting yourself or others. These symptoms may be an emergency. Get help right away. Call 911. Do not wait to see if the symptoms will go away. Do not drive yourself to the hospital. Get help right away if you feel like you may hurt yourself or others, or have thoughts about taking your own life. Go to your nearest emergency room or: Call 911. Call the National Suicide Prevention Lifeline at (262)721-8699 or 988. This is open 24 hours a day. Text the Crisis Text Line at 8450584327. Summary If you have fatigue, you feel tired all the time and have a lack of energy or a lack of motivation. Fatigue may make it difficult to start or complete tasks because of exhaustion. Long-term (chronic) or extreme fatigue may be a symptom of a medical condition. Exercise regularly, as told by your health care provider.  Change situations that cause you stress. Try to keep your work and personal schedules in balance. This information is not intended to replace advice given to you by your health care provider. Make sure you discuss any questions you have with your health care provider. Document Revised: 12/12/2020 Document  Reviewed: 12/12/2020 Elsevier Patient Education  2024 [employer].
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
## 2023-07-24 NOTE — Addendum Note (Signed)
 Addended by: Lorelie Biermann L on: 07/24/2023 09:03 AM   Modules accepted: Orders

## 2023-07-25 LAB — CMP14+EGFR
ALT: 10 IU/L (ref 0–32)
AST: 18 IU/L (ref 0–40)
Albumin: 4.4 g/dL (ref 3.9–4.9)
Alkaline Phosphatase: 90 IU/L (ref 44–121)
BUN/Creatinine Ratio: 18 (ref 12–28)
BUN: 19 mg/dL (ref 8–27)
Bilirubin Total: 0.3 mg/dL (ref 0.0–1.2)
CO2: 22 mmol/L (ref 20–29)
Calcium: 9.5 mg/dL (ref 8.7–10.3)
Chloride: 101 mmol/L (ref 96–106)
Creatinine, Ser: 1.07 mg/dL — ABNORMAL HIGH (ref 0.57–1.00)
Globulin, Total: 2.1 g/dL (ref 1.5–4.5)
Glucose: 91 mg/dL (ref 70–99)
Potassium: 3.8 mmol/L (ref 3.5–5.2)
Sodium: 138 mmol/L (ref 134–144)
Total Protein: 6.5 g/dL (ref 6.0–8.5)
eGFR: 56 mL/min/{1.73_m2} — ABNORMAL LOW (ref >59)

## 2023-07-25 LAB — VITAMIN B12: Vitamin B-12: 361 pg/mL (ref 232–1245)

## 2023-07-25 LAB — LIPID PANEL
Chol/HDL Ratio: 2.9 ratio (ref 0.0–4.4)
Cholesterol, Total: 152 mg/dL (ref 100–199)
HDL: 53 mg/dL (ref >39)
LDL Chol Calc (NIH): 83 mg/dL (ref 0–99)
Triglycerides: 87 mg/dL (ref 0–149)
VLDL Cholesterol Cal: 16 mg/dL (ref 5–40)

## 2023-07-25 LAB — VITAMIN D 25 HYDROXY (VIT D DEFICIENCY, FRACTURES): Vit D, 25-Hydroxy: 92.4 ng/mL (ref 30.0–100.0)

## 2023-07-25 LAB — CBC
Hematocrit: 40.4 % (ref 34.0–46.6)
Hemoglobin: 13.5 g/dL (ref 11.1–15.9)
MCH: 28.8 pg (ref 26.6–33.0)
MCHC: 33.4 g/dL (ref 31.5–35.7)
MCV: 86 fL (ref 79–97)
Platelets: 298 10*3/uL (ref 150–450)
RBC: 4.69 x10E6/uL (ref 3.77–5.28)
RDW: 13.1 % (ref 11.7–15.4)
WBC: 5.7 10*3/uL (ref 3.4–10.8)

## 2023-07-25 LAB — TSH+FREE T4
Free T4: 1.35 ng/dL (ref 0.82–1.77)
TSH: 1.01 u[IU]/mL (ref 0.450–4.500)

## 2023-07-26 ENCOUNTER — Other Ambulatory Visit: Payer: Self-pay | Admitting: Family Medicine

## 2023-07-26 ENCOUNTER — Ambulatory Visit: Payer: Self-pay | Admitting: Family Medicine

## 2023-07-26 DIAGNOSIS — J3081 Allergic rhinitis due to animal (cat) (dog) hair and dander: Secondary | ICD-10-CM | POA: Diagnosis not present

## 2023-07-26 DIAGNOSIS — N1831 Chronic kidney disease, stage 3a: Secondary | ICD-10-CM

## 2023-07-26 DIAGNOSIS — J3089 Other allergic rhinitis: Secondary | ICD-10-CM | POA: Diagnosis not present

## 2023-07-26 DIAGNOSIS — J301 Allergic rhinitis due to pollen: Secondary | ICD-10-CM | POA: Diagnosis not present

## 2023-07-26 MED ORDER — EMPAGLIFLOZIN 10 MG PO TABS: 10.0000 mg | ORAL_TABLET | Freq: Every day | ORAL | 3 refills | Status: DC

## 2023-07-26 NOTE — Progress Notes (Signed)
Pt notified.    LS

## 2023-07-26 NOTE — Progress Notes (Signed)
 Please inform pt Jardiance 10mg  sent. Please advise Julie [name] to use a wet wipe after each urination, followed by drying genitals completely.  Can consider applying barrier cream like desitin if she is prone to yeast infections.  Med will cause increased urination, so take in am.

## 2023-07-30 ENCOUNTER — Other Ambulatory Visit: Payer: Self-pay | Admitting: Family Medicine

## 2023-07-30 DIAGNOSIS — Z1231 Encounter for screening mammogram for malignant neoplasm of breast: Secondary | ICD-10-CM

## 2023-07-31 ENCOUNTER — Ambulatory Visit
Admission: RE | Admit: 2023-07-31 | Discharge: 2023-07-31 | Disposition: A | Discharge status: Home / Self Care | Source: Ambulatory Visit | Attending: Family Medicine | Admitting: Family Medicine

## 2023-07-31 DIAGNOSIS — Z1231 Encounter for screening mammogram for malignant neoplasm of breast: Secondary | ICD-10-CM

## 2023-08-01 DIAGNOSIS — J301 Allergic rhinitis due to pollen: Secondary | ICD-10-CM | POA: Diagnosis not present

## 2023-08-08 DIAGNOSIS — J3081 Allergic rhinitis due to animal (cat) (dog) hair and dander: Secondary | ICD-10-CM | POA: Diagnosis not present

## 2023-08-08 DIAGNOSIS — J301 Allergic rhinitis due to pollen: Secondary | ICD-10-CM | POA: Diagnosis not present

## 2023-08-08 DIAGNOSIS — J3089 Other allergic rhinitis: Secondary | ICD-10-CM | POA: Diagnosis not present

## 2023-08-15 DIAGNOSIS — J301 Allergic rhinitis due to pollen: Secondary | ICD-10-CM | POA: Diagnosis not present

## 2023-08-26 ENCOUNTER — Encounter: Attending: Family Medicine | Admitting: Nutrition

## 2023-08-26 ENCOUNTER — Encounter: Payer: Self-pay | Admitting: Nutrition

## 2023-08-26 VITALS — Ht 63.5 in | Wt 162.0 lb

## 2023-08-26 DIAGNOSIS — E663 Overweight: Secondary | ICD-10-CM | POA: Insufficient documentation

## 2023-08-26 DIAGNOSIS — I10 Essential (primary) hypertension: Secondary | ICD-10-CM | POA: Insufficient documentation

## 2023-08-26 DIAGNOSIS — E782 Mixed hyperlipidemia: Secondary | ICD-10-CM | POA: Insufficient documentation
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
## 2023-08-26 NOTE — Patient Instructions (Signed)
 Goals  Cut back sweet tea to 2 per day. Exercise swimming 5 days per week. Walk 30 minutes twice a week. Increase water  to 5-6 glasses per day

## 2023-08-28 DIAGNOSIS — J301 Allergic rhinitis due to pollen: Secondary | ICD-10-CM | POA: Diagnosis not present

## 2023-08-28 DIAGNOSIS — J3089 Other allergic rhinitis: Secondary | ICD-10-CM | POA: Diagnosis not present

## 2023-08-28 DIAGNOSIS — J3081 Allergic rhinitis due to animal (cat) (dog) hair and dander: Secondary | ICD-10-CM | POA: Diagnosis not present

## 2023-09-03 ENCOUNTER — Ambulatory Visit: Payer: Medicare PPO

## 2023-09-03 VITALS — BP 138/79 | HR 72 | Ht 63.0 in | Wt 165.0 lb

## 2023-09-03 DIAGNOSIS — Z0000 Encounter for general adult medical examination without abnormal findings: Secondary | ICD-10-CM | POA: Diagnosis not present

## 2023-09-03 DIAGNOSIS — Z1211 Encounter for screening for malignant neoplasm of colon: Secondary | ICD-10-CM

## 2023-09-03 NOTE — Patient Instructions (Signed)
 Ms. Tusing , Thank you for taking time out of your busy schedule to complete your Annual Wellness Visit with me. I enjoyed our conversation and look forward to speaking with you again next year. I, as well as your care team,  appreciate your ongoing commitment to your health goals. Please review the following plan we discussed and let me know if I can assist you in the future. Your Game plan/ To Do List    Follow up Visits: Next Medicare AWV with our clinical staff: 09/03/24 at 10:40a.m.   Next Office Visit with your provider: 01/24/24 at 11:00a.m.  Clinician Recommendations:  Aim for 30 minutes of exercise or brisk walking, 6-8 glasses of water , and 5 servings of fruits and vegetables each day.       This is a list of the screening recommended for you and due dates:  Health Maintenance  Topic Date Due   COVID-19 Vaccine (4 - 2024-25 season) 11/04/2022   Medicare Annual Wellness Visit  08/29/2023   Zoster (Shingles) Vaccine (2 of 2) 09/18/2023   Flu Shot  10/04/2023   Mammogram  07/30/2024   DEXA scan (bone density measurement)  12/29/2026   Colon Cancer Screening  09/02/2028   DTaP/Tdap/Td vaccine (2 - Td or Tdap) 07/01/2032   Pneumococcal Vaccine for age over 19  Completed   Hepatitis C Screening  Completed   Hepatitis B Vaccine  Aged Out   HPV Vaccine  Aged Out   Meningitis B Vaccine  Aged Out    Advanced directives: (Declined) Advance directive discussed with you today. Even though you declined this today, please call our office should you change your mind, and we can give you the proper paperwork for you to fill out. Advance Care Planning is important because it:  [x]  Makes sure you receive the medical care that is consistent with your values, goals, and preferences  [x]  It provides guidance to your family and loved ones and reduces their decisional burden about whether or not they are making the right decisions based on your wishes.  Follow the link provided in your after visit  summary or read over the paperwork we have mailed to you to help you started getting your Advance Directives in place. If you need assistance in completing these, please reach out to us  so that we can help you!  See attachments for Preventive Care and Fall Prevention Tips.
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
## 2023-09-04 DIAGNOSIS — J3089 Other allergic rhinitis: Secondary | ICD-10-CM | POA: Diagnosis not present

## 2023-09-04 DIAGNOSIS — J301 Allergic rhinitis due to pollen: Secondary | ICD-10-CM | POA: Diagnosis not present

## 2023-09-04 DIAGNOSIS — J3081 Allergic rhinitis due to animal (cat) (dog) hair and dander: Secondary | ICD-10-CM | POA: Diagnosis not present

## 2023-09-11 DIAGNOSIS — J301 Allergic rhinitis due to pollen: Secondary | ICD-10-CM | POA: Diagnosis not present

## 2023-09-11 DIAGNOSIS — J3081 Allergic rhinitis due to animal (cat) (dog) hair and dander: Secondary | ICD-10-CM | POA: Diagnosis not present

## 2023-09-11 DIAGNOSIS — J3089 Other allergic rhinitis: Secondary | ICD-10-CM | POA: Diagnosis not present

## 2023-09-19 ENCOUNTER — Other Ambulatory Visit: Payer: Self-pay | Admitting: Family Medicine

## 2023-09-19 DIAGNOSIS — J3081 Allergic rhinitis due to animal (cat) (dog) hair and dander: Secondary | ICD-10-CM | POA: Diagnosis not present

## 2023-09-19 DIAGNOSIS — J301 Allergic rhinitis due to pollen: Secondary | ICD-10-CM | POA: Diagnosis not present

## 2023-09-19 DIAGNOSIS — R4589 Other symptoms and signs involving emotional state: Secondary | ICD-10-CM

## 2023-09-19 DIAGNOSIS — J3089 Other allergic rhinitis: Secondary | ICD-10-CM | POA: Diagnosis not present

## 2023-09-19 DIAGNOSIS — F410 Panic disorder [episodic paroxysmal anxiety] without agoraphobia: Secondary | ICD-10-CM

## 2023-09-23 ENCOUNTER — Other Ambulatory Visit: Payer: Self-pay | Admitting: Family Medicine

## 2023-09-23 ENCOUNTER — Encounter: Payer: Self-pay | Admitting: Nutrition

## 2023-09-23 ENCOUNTER — Encounter: Attending: Family Medicine | Admitting: Nutrition

## 2023-09-23 VITALS — Ht 63.5 in | Wt 156.0 lb

## 2023-09-23 DIAGNOSIS — E782 Mixed hyperlipidemia: Secondary | ICD-10-CM | POA: Insufficient documentation

## 2023-09-23 DIAGNOSIS — I10 Essential (primary) hypertension: Secondary | ICD-10-CM | POA: Insufficient documentation

## 2023-09-23 DIAGNOSIS — N1831 Chronic kidney disease, stage 3a: Secondary | ICD-10-CM

## 2023-09-23 DIAGNOSIS — E663 Overweight: Secondary | ICD-10-CM | POA: Insufficient documentation

## 2023-09-23 NOTE — Patient Instructions (Signed)
 Keep up the great job! Aim for 25 grams of fiber per day. Keep exercising and drinking water  Keep swimming- great exercise Continue to focus on whole plant based foods.
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
## 2023-09-27 DIAGNOSIS — Z961 Presence of intraocular lens: Secondary | ICD-10-CM | POA: Diagnosis not present

## 2023-09-27 DIAGNOSIS — H04123 Dry eye syndrome of bilateral lacrimal glands: Secondary | ICD-10-CM | POA: Diagnosis not present

## 2023-09-27 DIAGNOSIS — H0288B Meibomian gland dysfunction left eye, upper and lower eyelids: Secondary | ICD-10-CM | POA: Diagnosis not present

## 2023-09-27 DIAGNOSIS — L718 Other rosacea: Secondary | ICD-10-CM | POA: Diagnosis not present

## 2023-09-27 DIAGNOSIS — H0288A Meibomian gland dysfunction right eye, upper and lower eyelids: Secondary | ICD-10-CM | POA: Diagnosis not present

## 2023-10-01 DIAGNOSIS — J301 Allergic rhinitis due to pollen: Secondary | ICD-10-CM | POA: Diagnosis not present

## 2023-10-10 DIAGNOSIS — J3081 Allergic rhinitis due to animal (cat) (dog) hair and dander: Secondary | ICD-10-CM | POA: Diagnosis not present

## 2023-10-10 DIAGNOSIS — J301 Allergic rhinitis due to pollen: Secondary | ICD-10-CM | POA: Diagnosis not present

## 2023-10-10 DIAGNOSIS — J3089 Other allergic rhinitis: Secondary | ICD-10-CM | POA: Diagnosis not present

## 2023-10-15 DIAGNOSIS — J3089 Other allergic rhinitis: Secondary | ICD-10-CM | POA: Diagnosis not present

## 2023-10-15 DIAGNOSIS — J301 Allergic rhinitis due to pollen: Secondary | ICD-10-CM | POA: Diagnosis not present

## 2023-10-15 DIAGNOSIS — J3081 Allergic rhinitis due to animal (cat) (dog) hair and dander: Secondary | ICD-10-CM | POA: Diagnosis not present

## 2023-10-21 DIAGNOSIS — J3081 Allergic rhinitis due to animal (cat) (dog) hair and dander: Secondary | ICD-10-CM | POA: Diagnosis not present

## 2023-10-21 DIAGNOSIS — J3089 Other allergic rhinitis: Secondary | ICD-10-CM | POA: Diagnosis not present

## 2023-10-21 DIAGNOSIS — J301 Allergic rhinitis due to pollen: Secondary | ICD-10-CM | POA: Diagnosis not present

## 2023-10-31 DIAGNOSIS — J301 Allergic rhinitis due to pollen: Secondary | ICD-10-CM | POA: Diagnosis not present

## 2023-10-31 DIAGNOSIS — J3081 Allergic rhinitis due to animal (cat) (dog) hair and dander: Secondary | ICD-10-CM | POA: Diagnosis not present

## 2023-10-31 DIAGNOSIS — J3089 Other allergic rhinitis: Secondary | ICD-10-CM | POA: Diagnosis not present

## 2023-11-05 DIAGNOSIS — J3081 Allergic rhinitis due to animal (cat) (dog) hair and dander: Secondary | ICD-10-CM | POA: Diagnosis not present

## 2023-11-05 DIAGNOSIS — J301 Allergic rhinitis due to pollen: Secondary | ICD-10-CM | POA: Diagnosis not present

## 2023-11-05 DIAGNOSIS — J3089 Other allergic rhinitis: Secondary | ICD-10-CM | POA: Diagnosis not present

## 2023-11-13 DIAGNOSIS — J301 Allergic rhinitis due to pollen: Secondary | ICD-10-CM | POA: Diagnosis not present

## 2023-11-20 DIAGNOSIS — J301 Allergic rhinitis due to pollen: Secondary | ICD-10-CM | POA: Diagnosis not present

## 2023-11-21 ENCOUNTER — Other Ambulatory Visit: Payer: Self-pay | Admitting: Family Medicine

## 2023-11-21 DIAGNOSIS — E890 Postprocedural hypothyroidism: Secondary | ICD-10-CM

## 2023-11-27 DIAGNOSIS — J301 Allergic rhinitis due to pollen: Secondary | ICD-10-CM | POA: Diagnosis not present
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
## 2023-12-05 DIAGNOSIS — J3089 Other allergic rhinitis: Secondary | ICD-10-CM | POA: Diagnosis not present

## 2023-12-05 DIAGNOSIS — J3081 Allergic rhinitis due to animal (cat) (dog) hair and dander: Secondary | ICD-10-CM | POA: Diagnosis not present

## 2023-12-05 DIAGNOSIS — J301 Allergic rhinitis due to pollen: Secondary | ICD-10-CM | POA: Diagnosis not present

## 2023-12-19 DIAGNOSIS — J3089 Other allergic rhinitis: Secondary | ICD-10-CM | POA: Diagnosis not present

## 2023-12-19 DIAGNOSIS — J3081 Allergic rhinitis due to animal (cat) (dog) hair and dander: Secondary | ICD-10-CM | POA: Diagnosis not present

## 2023-12-19 DIAGNOSIS — J301 Allergic rhinitis due to pollen: Secondary | ICD-10-CM | POA: Diagnosis not present

## 2023-12-30 DIAGNOSIS — J3089 Other allergic rhinitis: Secondary | ICD-10-CM | POA: Diagnosis not present

## 2023-12-30 DIAGNOSIS — J3081 Allergic rhinitis due to animal (cat) (dog) hair and dander: Secondary | ICD-10-CM | POA: Diagnosis not present

## 2023-12-30 DIAGNOSIS — J301 Allergic rhinitis due to pollen: Secondary | ICD-10-CM | POA: Diagnosis not present

## 2024-01-08 DIAGNOSIS — J3081 Allergic rhinitis due to animal (cat) (dog) hair and dander: Secondary | ICD-10-CM | POA: Diagnosis not present

## 2024-01-08 DIAGNOSIS — J3089 Other allergic rhinitis: Secondary | ICD-10-CM | POA: Diagnosis not present

## 2024-01-08 DIAGNOSIS — J301 Allergic rhinitis due to pollen: Secondary | ICD-10-CM | POA: Diagnosis not present

## 2024-01-16 DIAGNOSIS — J3089 Other allergic rhinitis: Secondary | ICD-10-CM | POA: Diagnosis not present

## 2024-01-16 DIAGNOSIS — J301 Allergic rhinitis due to pollen: Secondary | ICD-10-CM | POA: Diagnosis not present

## 2024-01-16 DIAGNOSIS — J3081 Allergic rhinitis due to animal (cat) (dog) hair and dander: Secondary | ICD-10-CM | POA: Diagnosis not present

## 2024-01-17 DIAGNOSIS — H04123 Dry eye syndrome of bilateral lacrimal glands: Secondary | ICD-10-CM | POA: Diagnosis not present

## 2024-01-17 DIAGNOSIS — H40033 Anatomical narrow angle, bilateral: Secondary | ICD-10-CM | POA: Diagnosis not present

## 2024-01-22 DIAGNOSIS — J301 Allergic rhinitis due to pollen: Secondary | ICD-10-CM | POA: Diagnosis not present

## 2024-01-22 DIAGNOSIS — J3081 Allergic rhinitis due to animal (cat) (dog) hair and dander: Secondary | ICD-10-CM | POA: Diagnosis not present

## 2024-01-22 DIAGNOSIS — J3089 Other allergic rhinitis: Secondary | ICD-10-CM | POA: Diagnosis not present

## 2024-01-24 ENCOUNTER — Other Ambulatory Visit: Payer: Self-pay | Admitting: Family Medicine

## 2024-01-24 ENCOUNTER — Encounter: Payer: Self-pay | Admitting: Family Medicine

## 2024-01-24 ENCOUNTER — Other Ambulatory Visit

## 2024-01-24 ENCOUNTER — Ambulatory Visit: Payer: Self-pay | Admitting: Family Medicine

## 2024-01-24 VITALS — BP 119/78 | HR 67 | Temp 97.9°F | Ht 63.5 in | Wt 154.4 lb

## 2024-01-24 DIAGNOSIS — F410 Panic disorder [episodic paroxysmal anxiety] without agoraphobia: Secondary | ICD-10-CM | POA: Diagnosis not present

## 2024-01-24 DIAGNOSIS — E559 Vitamin D deficiency, unspecified: Secondary | ICD-10-CM

## 2024-01-24 DIAGNOSIS — N1831 Chronic kidney disease, stage 3a: Secondary | ICD-10-CM | POA: Diagnosis not present

## 2024-01-24 DIAGNOSIS — Z13820 Encounter for screening for osteoporosis: Secondary | ICD-10-CM

## 2024-01-24 DIAGNOSIS — Z79899 Other long term (current) drug therapy: Secondary | ICD-10-CM | POA: Diagnosis not present

## 2024-01-24 DIAGNOSIS — E890 Postprocedural hypothyroidism: Secondary | ICD-10-CM

## 2024-01-24 DIAGNOSIS — Z780 Asymptomatic menopausal state: Secondary | ICD-10-CM | POA: Diagnosis not present

## 2024-01-24 DIAGNOSIS — E7800 Pure hypercholesterolemia, unspecified: Secondary | ICD-10-CM | POA: Diagnosis not present

## 2024-01-24 DIAGNOSIS — I10 Essential (primary) hypertension: Secondary | ICD-10-CM | POA: Diagnosis not present

## 2024-01-24 DIAGNOSIS — R4589 Other symptoms and signs involving emotional state: Secondary | ICD-10-CM | POA: Diagnosis not present

## 2024-01-24 MED ORDER — LOSARTAN POTASSIUM-HCTZ 50-12.5 MG PO TABS: 1.0000 | ORAL_TABLET | Freq: Every day | ORAL | 3 refills | Status: AC

## 2024-01-24 MED ORDER — ROSUVASTATIN CALCIUM 5 MG PO TABS: ORAL_TABLET | ORAL | 3 refills | Status: AC

## 2024-01-24 MED ORDER — VITAMIN D (ERGOCALCIFEROL) 1.25 MG (50000 UNIT) PO CAPS: ORAL_CAPSULE | ORAL | 3 refills | Status: AC

## 2024-01-24 MED ORDER — CITALOPRAM HYDROBROMIDE 20 MG PO TABS: 20.0000 mg | ORAL_TABLET | Freq: Every day | ORAL | 1 refills | Status: AC

## 2024-01-24 MED ORDER — LEVOTHYROXINE SODIUM 112 MCG PO TABS: ORAL_TABLET | ORAL | 1 refills | Status: AC

## 2024-01-24 MED ORDER — EMPAGLIFLOZIN 10 MG PO TABS: 10.0000 mg | ORAL_TABLET | Freq: Every day | ORAL | 3 refills | Status: AC

## 2024-01-24 MED ORDER — CLONAZEPAM 0.5 MG PO TABS: 0.2500 mg | ORAL_TABLET | Freq: Two times a day (BID) | ORAL | 1 refills | Status: AC | PRN
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
## 2024-01-25 LAB — RENAL FUNCTION PANEL
Albumin: 4.3 g/dL (ref 3.9–4.9)
BUN/Creatinine Ratio: 13 (ref 12–28)
BUN: 13 mg/dL (ref 8–27)
CO2: 23 mmol/L (ref 20–29)
Calcium: 9.4 mg/dL (ref 8.7–10.3)
Chloride: 101 mmol/L (ref 96–106)
Creatinine, Ser: 0.99 mg/dL (ref 0.57–1.00)
Glucose: 88 mg/dL (ref 70–99)
Phosphorus: 3.3 mg/dL (ref 3.0–4.3)
Potassium: 4.1 mmol/L (ref 3.5–5.2)
Sodium: 140 mmol/L (ref 134–144)
eGFR: 61 mL/min/1.73 (ref >59)

## 2024-01-25 LAB — TSH+FREE T4
Free T4: 1.31 ng/dL (ref 0.82–1.77)
TSH: 1.33 u[IU]/mL (ref 0.450–4.500)

## 2024-01-27 ENCOUNTER — Ambulatory Visit: Payer: Self-pay | Admitting: Family Medicine

## 2024-01-27 DIAGNOSIS — Z780 Asymptomatic menopausal state: Secondary | ICD-10-CM | POA: Diagnosis not present

## 2024-01-28 DIAGNOSIS — J301 Allergic rhinitis due to pollen: Secondary | ICD-10-CM | POA: Diagnosis not present

## 2024-01-30 LAB — TOXASSURE SELECT 13 (MW), URINE

## 2024-02-03 DIAGNOSIS — J301 Allergic rhinitis due to pollen: Secondary | ICD-10-CM | POA: Diagnosis not present

## 2024-02-03 DIAGNOSIS — J3089 Other allergic rhinitis: Secondary | ICD-10-CM | POA: Diagnosis not present

## 2024-02-03 DIAGNOSIS — J3081 Allergic rhinitis due to animal (cat) (dog) hair and dander: Secondary | ICD-10-CM | POA: Diagnosis not present

## 2024-02-05 ENCOUNTER — Encounter: Admitting: Nutrition

## 2024-02-05 ENCOUNTER — Encounter: Payer: Self-pay | Admitting: Nutrition

## 2024-02-05 VITALS — Ht 63.5 in | Wt 154.0 lb

## 2024-02-05 DIAGNOSIS — I10 Essential (primary) hypertension: Secondary | ICD-10-CM | POA: Insufficient documentation

## 2024-02-05 DIAGNOSIS — N1831 Chronic kidney disease, stage 3a: Secondary | ICD-10-CM | POA: Insufficient documentation

## 2024-02-05 DIAGNOSIS — E782 Mixed hyperlipidemia: Secondary | ICD-10-CM | POA: Diagnosis present
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
## 2024-02-05 NOTE — Patient Instructions (Signed)
 Keep up the great job. Look into a plant based cookbook for ideas.

## 2024-09-03 ENCOUNTER — Ambulatory Visit: Payer: Self-pay

## 2024-09-15 ENCOUNTER — Encounter: Admitting: Family Medicine
